# Patient Record
Sex: Female | Born: 1990 | Race: Black or African American | Hispanic: No | Marital: Single | Smoking: Current some day smoker
Health system: Southern US, Community
[De-identification: ages and names within clinical notes are randomized; demographics above are authoritative.]

## PROBLEM LIST (undated history)

## (undated) ENCOUNTER — Emergency Department (HOSPITAL_COMMUNITY): Admission: EM | Payer: Self-pay | Source: Home / Self Care

## (undated) DIAGNOSIS — R51 Headache: Secondary | ICD-10-CM

## (undated) DIAGNOSIS — R519 Headache, unspecified: Secondary | ICD-10-CM

## (undated) DIAGNOSIS — I10 Essential (primary) hypertension: Secondary | ICD-10-CM

## (undated) DIAGNOSIS — G8929 Other chronic pain: Secondary | ICD-10-CM

## (undated) HISTORY — DX: Essential (primary) hypertension: I10

## (undated) HISTORY — PX: EYE SURGERY: SHX253

---
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
## 2009-06-08 ENCOUNTER — Emergency Department (HOSPITAL_COMMUNITY): Admission: EM | Admit: 2009-06-08 | Discharge: 2009-06-08 | Payer: Self-pay | Admitting: Emergency Medicine

## 2009-12-16 ENCOUNTER — Emergency Department (HOSPITAL_COMMUNITY): Admission: EM | Admit: 2009-12-16 | Discharge: 2009-12-16 | Payer: Self-pay | Admitting: Emergency Medicine

## 2010-01-30 ENCOUNTER — Emergency Department (HOSPITAL_COMMUNITY): Admission: EM | Admit: 2010-01-30 | Discharge: 2010-01-30 | Payer: Self-pay | Admitting: Emergency Medicine

## 2010-04-27 ENCOUNTER — Emergency Department (HOSPITAL_COMMUNITY): Admission: EM | Admit: 2010-04-27 | Discharge: 2010-04-27 | Payer: Self-pay | Admitting: Emergency Medicine

## 2010-11-30 ENCOUNTER — Emergency Department (HOSPITAL_COMMUNITY)
Admission: EM | Admit: 2010-11-30 | Discharge: 2010-11-30 | Payer: Self-pay | Source: Home / Self Care | Admitting: Emergency Medicine

## 2011-01-22 ENCOUNTER — Emergency Department (HOSPITAL_COMMUNITY)
Admission: EM | Admit: 2011-01-22 | Discharge: 2011-01-22 | Disposition: A | Discharge status: Home / Self Care | Payer: Self-pay | Attending: Diagnostic Radiology | Admitting: Diagnostic Radiology

## 2011-01-22 DIAGNOSIS — R300 Dysuria: Secondary | ICD-10-CM | POA: Insufficient documentation

## 2011-01-22 DIAGNOSIS — N72 Inflammatory disease of cervix uteri: Secondary | ICD-10-CM | POA: Insufficient documentation

## 2011-01-22 LAB — URINALYSIS, ROUTINE W REFLEX MICROSCOPIC
Bilirubin Urine: NEGATIVE
Leukocytes, UA: NEGATIVE
Nitrite: NEGATIVE
Protein, ur: NEGATIVE mg/dL
Specific Gravity, Urine: 1.025 (ref 1.005–1.030)
Urine Glucose, Fasting: NEGATIVE mg/dL
Urobilinogen, UA: 1 mg/dL (ref 0.0–1.0)
pH: 6.5 (ref 5.0–8.0)

## 2011-01-22 LAB — WET PREP, GENITAL
Trich, Wet Prep: NONE SEEN
Yeast Wet Prep HPF POC: NONE SEEN

## 2011-01-22 LAB — URINE MICROSCOPIC-ADD ON

## 2011-01-24 LAB — URINE CULTURE: Colony Count: 80000

## 2011-01-24 LAB — GC/CHLAMYDIA PROBE AMP, URINE: GC Probe Amp, Urine: NEGATIVE

## 2011-02-26 LAB — POCT PREGNANCY, URINE: Preg Test, Ur: NEGATIVE

## 2011-03-06 LAB — URINALYSIS, ROUTINE W REFLEX MICROSCOPIC
Bilirubin Urine: NEGATIVE
Hgb urine dipstick: NEGATIVE
Nitrite: NEGATIVE
Protein, ur: NEGATIVE mg/dL
Specific Gravity, Urine: 1.025 (ref 1.005–1.030)
Urobilinogen, UA: 0.2 mg/dL (ref 0.0–1.0)

## 2011-03-06 LAB — WET PREP, GENITAL: Trich, Wet Prep: NONE SEEN

## 2011-03-06 LAB — GC/CHLAMYDIA PROBE AMP, GENITAL
Chlamydia, DNA Probe: NEGATIVE
GC Probe Amp, Genital: NEGATIVE

## 2011-03-26 LAB — URINALYSIS, ROUTINE W REFLEX MICROSCOPIC
Ketones, ur: NEGATIVE mg/dL
Nitrite: NEGATIVE
Protein, ur: NEGATIVE mg/dL
Urobilinogen, UA: 0.2 mg/dL (ref 0.0–1.0)

## 2011-03-26 LAB — URINE MICROSCOPIC-ADD ON

## 2011-03-26 LAB — PREGNANCY, URINE: Preg Test, Ur: NEGATIVE

## 2011-04-16 ENCOUNTER — Emergency Department (HOSPITAL_COMMUNITY)
Admission: EM | Admit: 2011-04-16 | Discharge: 2011-04-16 | Disposition: A | Discharge status: Home / Self Care | Payer: Self-pay | Attending: Emergency Medicine | Admitting: Emergency Medicine

## 2011-04-16 DIAGNOSIS — R112 Nausea with vomiting, unspecified: Secondary | ICD-10-CM | POA: Insufficient documentation

## 2011-04-16 DIAGNOSIS — R197 Diarrhea, unspecified: Secondary | ICD-10-CM | POA: Insufficient documentation

## 2011-04-16 DIAGNOSIS — J45909 Unspecified asthma, uncomplicated: Secondary | ICD-10-CM | POA: Insufficient documentation

## 2011-04-16 LAB — URINALYSIS, ROUTINE W REFLEX MICROSCOPIC
Ketones, ur: NEGATIVE mg/dL
Nitrite: NEGATIVE
Protein, ur: NEGATIVE mg/dL

## 2011-05-10 ENCOUNTER — Emergency Department (HOSPITAL_COMMUNITY)
Admission: EM | Admit: 2011-05-10 | Discharge: 2011-05-10 | Disposition: A | Discharge status: Home / Self Care | Payer: Self-pay | Attending: Emergency Medicine | Admitting: Emergency Medicine

## 2011-05-10 DIAGNOSIS — J069 Acute upper respiratory infection, unspecified: Secondary | ICD-10-CM | POA: Insufficient documentation

## 2011-05-10 DIAGNOSIS — J029 Acute pharyngitis, unspecified: Secondary | ICD-10-CM | POA: Insufficient documentation

## 2011-05-10 DIAGNOSIS — J329 Chronic sinusitis, unspecified: Secondary | ICD-10-CM | POA: Insufficient documentation

## 2011-05-10 DIAGNOSIS — R05 Cough: Secondary | ICD-10-CM | POA: Insufficient documentation

## 2011-05-10 DIAGNOSIS — R059 Cough, unspecified: Secondary | ICD-10-CM | POA: Insufficient documentation

## 2011-09-13 LAB — URINE MICROSCOPIC-ADD ON

## 2011-09-13 LAB — DIFFERENTIAL
Eosinophils Absolute: 0.1
Lymphocytes Relative: 47
Lymphs Abs: 1.9
Monocytes Relative: 10
Neutro Abs: 1.6 — ABNORMAL LOW
Neutrophils Relative %: 40 — ABNORMAL LOW

## 2011-09-13 LAB — URINE CULTURE: Colony Count: 8000

## 2011-09-13 LAB — CBC
MCV: 90.9
RBC: 3.81
WBC: 3.9 — ABNORMAL LOW

## 2011-09-13 LAB — BASIC METABOLIC PANEL
Chloride: 105
Creatinine, Ser: 0.91

## 2011-09-13 LAB — URINALYSIS, ROUTINE W REFLEX MICROSCOPIC
Glucose, UA: NEGATIVE
Hgb urine dipstick: NEGATIVE
Ketones, ur: NEGATIVE
Protein, ur: NEGATIVE

## 2011-09-21 LAB — URINALYSIS, ROUTINE W REFLEX MICROSCOPIC
Glucose, UA: NEGATIVE
Leukocytes, UA: NEGATIVE
Specific Gravity, Urine: 1.025
pH: 6

## 2011-09-21 LAB — PREGNANCY, URINE: Preg Test, Ur: NEGATIVE

## 2011-09-21 LAB — URINE MICROSCOPIC-ADD ON

## 2011-11-18 ENCOUNTER — Encounter: Payer: Self-pay | Admitting: *Deleted

## 2011-11-18 ENCOUNTER — Emergency Department (HOSPITAL_COMMUNITY): Payer: No Typology Code available for payment source

## 2011-11-18 ENCOUNTER — Emergency Department (HOSPITAL_COMMUNITY)
Admission: EM | Admit: 2011-11-18 | Discharge: 2011-11-18 | Disposition: A | Discharge status: Home / Self Care | Payer: No Typology Code available for payment source | Attending: Emergency Medicine | Admitting: Emergency Medicine

## 2011-11-18 DIAGNOSIS — M542 Cervicalgia: Secondary | ICD-10-CM | POA: Insufficient documentation

## 2011-11-18 DIAGNOSIS — T148XXA Other injury of unspecified body region, initial encounter: Secondary | ICD-10-CM | POA: Insufficient documentation

## 2011-11-18 DIAGNOSIS — M549 Dorsalgia, unspecified: Secondary | ICD-10-CM | POA: Insufficient documentation

## 2011-11-18 DIAGNOSIS — Y92410 Unspecified street and highway as the place of occurrence of the external cause: Secondary | ICD-10-CM | POA: Insufficient documentation

## 2011-11-18 MED ORDER — OXYCODONE-ACETAMINOPHEN 5-325 MG PO TABS: 1.0000 | ORAL_TABLET | Freq: Four times a day (QID) | ORAL | Status: AC | PRN

## 2011-11-18 MED ORDER — IBUPROFEN 600 MG PO TABS: 600.0000 mg | ORAL_TABLET | Freq: Four times a day (QID) | ORAL | Status: AC | PRN

## 2011-11-18 MED ORDER — OXYCODONE-ACETAMINOPHEN 5-325 MG PO TABS
2.0000 | ORAL_TABLET | Freq: Once | ORAL | Status: AC
  Administered 2011-11-18: 2 via ORAL
  Filled 2011-11-18 (×2): qty 1

## 2011-11-18 MED ORDER — CYCLOBENZAPRINE HCL 10 MG PO TABS: 10.0000 mg | ORAL_TABLET | Freq: Two times a day (BID) | ORAL | Status: AC | PRN

## 2011-11-18 NOTE — ED Provider Notes (Signed)
Medical screening examination/treatment/procedure(s) were performed by non-physician practitioner and as supervising physician I was immediately available for consultation/collaboration.  Celene Kras, MD 11/18/11 [phone]

## 2011-11-18 NOTE — ED Notes (Signed)
Family at bedside.pt arrived in c-collar and back board
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
## 2011-11-20 ENCOUNTER — Emergency Department (HOSPITAL_COMMUNITY): Payer: No Typology Code available for payment source

## 2011-11-20 ENCOUNTER — Encounter (HOSPITAL_COMMUNITY): Payer: Self-pay | Admitting: *Deleted

## 2011-11-20 ENCOUNTER — Emergency Department (HOSPITAL_COMMUNITY)
Admission: EM | Admit: 2011-11-20 | Discharge: 2011-11-21 | Disposition: A | Discharge status: Home / Self Care | Payer: No Typology Code available for payment source | Attending: Emergency Medicine | Admitting: Emergency Medicine

## 2011-11-20 DIAGNOSIS — T07XXXA Unspecified multiple injuries, initial encounter: Secondary | ICD-10-CM | POA: Insufficient documentation

## 2011-11-20 DIAGNOSIS — R079 Chest pain, unspecified: Secondary | ICD-10-CM | POA: Insufficient documentation

## 2011-11-20 DIAGNOSIS — M25569 Pain in unspecified knee: Secondary | ICD-10-CM | POA: Insufficient documentation

## 2011-11-20 DIAGNOSIS — J45909 Unspecified asthma, uncomplicated: Secondary | ICD-10-CM | POA: Insufficient documentation

## 2011-11-20 DIAGNOSIS — M79609 Pain in unspecified limb: Secondary | ICD-10-CM | POA: Insufficient documentation

## 2011-11-20 DIAGNOSIS — M25559 Pain in unspecified hip: Secondary | ICD-10-CM | POA: Insufficient documentation
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
## 2011-11-21 NOTE — ED Provider Notes (Signed)
Medical screening examination/treatment/procedure(s) were performed by non-physician practitioner and as supervising physician I was immediately available for consultation/collaboration.   Benny Lennert, MD 11/21/11 1452
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
## 2012-01-22 NOTE — ED Provider Notes (Signed)
Medical screening examination/treatment/procedure(s) were performed by non-physician practitioner and as supervising physician I was immediately available for consultation/collaboration. \  Rigoberto Repass L Dariela Stoker, MD 01/22/12 1619 

## 2012-02-18 ENCOUNTER — Encounter (HOSPITAL_COMMUNITY): Payer: Self-pay | Admitting: Emergency Medicine

## 2012-02-18 ENCOUNTER — Emergency Department (HOSPITAL_COMMUNITY)
Admission: EM | Admit: 2012-02-18 | Discharge: 2012-02-18 | Disposition: A | Discharge status: Home / Self Care | Payer: Self-pay | Attending: Emergency Medicine | Admitting: Emergency Medicine

## 2012-02-18 DIAGNOSIS — J45909 Unspecified asthma, uncomplicated: Secondary | ICD-10-CM | POA: Insufficient documentation

## 2012-02-18 DIAGNOSIS — N390 Urinary tract infection, site not specified: Secondary | ICD-10-CM | POA: Insufficient documentation

## 2012-02-18 DIAGNOSIS — R350 Frequency of micturition: Secondary | ICD-10-CM | POA: Insufficient documentation

## 2012-02-18 LAB — POCT PREGNANCY, URINE: Preg Test, Ur: NEGATIVE

## 2012-02-18 LAB — URINALYSIS, ROUTINE W REFLEX MICROSCOPIC
Bilirubin Urine: NEGATIVE
Ketones, ur: NEGATIVE mg/dL
Protein, ur: NEGATIVE mg/dL
Specific Gravity, Urine: >1.03 — ABNORMAL HIGH (ref 1.005–1.030)
Urobilinogen, UA: 0.2 mg/dL (ref 0.0–1.0)

## 2012-02-18 LAB — URINE MICROSCOPIC-ADD ON

## 2012-02-18 MED ORDER — NITROFURANTOIN MONOHYD MACRO 100 MG PO CAPS: 100.0000 mg | ORAL_CAPSULE | Freq: Two times a day (BID) | ORAL | Status: AC

## 2012-02-18 MED ORDER — IBUPROFEN 800 MG PO TABS
800.0000 mg | ORAL_TABLET | Freq: Once | ORAL | Status: AC
  Administered 2012-02-18: 800 mg via ORAL
  Filled 2012-02-18: qty 1

## 2012-02-18 MED ORDER — NITROFURANTOIN MACROCRYSTAL 100 MG PO CAPS
100.0000 mg | ORAL_CAPSULE | Freq: Once | ORAL | Status: AC
  Administered 2012-02-18: 100 mg via ORAL
  Filled 2012-02-18: qty 1

## 2012-02-18 MED ORDER — PHENAZOPYRIDINE HCL 200 MG PO TABS: 200.0000 mg | ORAL_TABLET | Freq: Three times a day (TID) | ORAL | Status: AC

## 2012-02-18 NOTE — ED Notes (Signed)
Voiding frequently, denies pain. Alert , NAD

## 2012-02-18 NOTE — ED Notes (Signed)
Pt c/o urinary frequency, but denies any pain.

## 2012-02-18 NOTE — ED Provider Notes (Signed)
History     CSN: [id number]  Arrival date & time 02/18/12  1352   First MD Initiated Contact with Patient 02/18/12 1440      Chief Complaint  Patient presents with  . Urinary Frequency    (Consider location/radiation/quality/duration/timing/severity/associated sxs/prior treatment) Patient is a 21 y.o. female presenting with dysuria. The history is provided by the patient. No language interpreter was used.  Dysuria  This is a new problem. The current episode started 2 days ago. The problem occurs every urination. The problem has not changed since onset.The quality of the pain is described as burning. The pain is at a severity of 6/10. The pain is moderate. There has been no fever. She is sexually active. There is no history of pyelonephritis. Associated symptoms include frequency. Pertinent negatives include no chills, no vomiting, no discharge, no hematuria, no hesitancy, no possible pregnancy, no urgency and no flank pain. She has tried nothing for the symptoms. Her past medical history is significant for recurrent UTIs. Her past medical history does not include kidney stones, single kidney, urological procedure, urinary stasis or catheterization.    Past Medical History  Diagnosis Date  . Asthma     History reviewed. No pertinent past surgical history.  History reviewed. No pertinent family history.  History  Substance Use Topics  . Smoking status: Never Smoker   . Smokeless tobacco: Not on file  . Alcohol Use: No    OB History    Grav Para Term Preterm Abortions TAB SAB Ect Mult Living                  Review of Systems  Constitutional: Negative for fever and chills.  Gastrointestinal: Negative for vomiting, abdominal pain and diarrhea.  Genitourinary: Positive for dysuria and frequency. Negative for hesitancy, urgency, hematuria, flank pain, vaginal bleeding, vaginal discharge and vaginal pain.  Musculoskeletal: Negative for back pain.  Neurological: Negative for  dizziness, weakness and numbness.  All other systems reviewed and are negative.    Allergies  Tomato  Home Medications   Current Outpatient Rx  Name Route Sig Dispense Refill  . ALBUTEROL SULFATE HFA 108 (90 BASE) MCG/ACT IN AERS Inhalation Inhale 2 puffs into the lungs every 6 (six) hours as needed. For shortness of breath       BP 120/65  Pulse 112  Temp(Src) 98 F (36.7 C) (Oral)  Resp 18  Ht 5' (1.524 m)  Wt 160 lb (72.576 kg)  BMI 31.25 kg/m2  SpO2 100%  LMP 02/16/2012  Physical Exam  Nursing note and vitals reviewed. Constitutional: She is oriented to person, place, and time. She appears well-developed and well-nourished. No distress.  HENT:  Head: Normocephalic and atraumatic.  Mouth/Throat: Oropharynx is clear and moist.  Neck: Normal range of motion. Neck supple.  Cardiovascular: Normal rate, regular rhythm and normal heart sounds.   Pulmonary/Chest: Effort normal and breath sounds normal. No respiratory distress.  Abdominal: Soft. She exhibits no distension. There is no tenderness. There is no rebound, no guarding and no CVA tenderness.  Musculoskeletal: Normal range of motion. She exhibits no tenderness.  Lymphadenopathy:    She has no cervical adenopathy.  Neurological: She is alert and oriented to person, place, and time. No cranial nerve deficit. She exhibits normal muscle tone. Coordination normal.  Skin: Skin is warm and dry.    ED Course  Procedures (including critical care time)  Labs Reviewed  URINALYSIS, ROUTINE W REFLEX MICROSCOPIC - Abnormal; Notable for the following:  Specific Gravity, Urine >1.030 (*)    Hgb urine dipstick SMALL (*)    Leukocytes, UA SMALL (*)    All other components within normal limits  URINE MICROSCOPIC-ADD ON - Abnormal; Notable for the following:    Squamous Epithelial / LPF MANY (*)    Bacteria, UA MANY (*)    All other components within normal limits  POCT PREGNANCY, URINE      Urine culture is  pending  MDM     Patient has dysuria type symptoms for several days. she denies any pain at present. She is nontoxic appearing. Abdomen is soft and nontender no CVA tenderness on exam, vomiting or fever to suggest pyelonephritis. I will start her on Macrobid she agrees to close followup with her physician or to return here if symptoms worsen   Patient / Family / Caregiver understand and agree with initial ED impression and plan with expectations set for ED visit. Pt stable in ED with no significant deterioration in condition.     Eual Lindstrom L. Clyde, [place] 02/18/12 1947

## 2012-02-19 LAB — URINE CULTURE: Culture  Setup Time: 201303050133

## 2012-02-19 NOTE — ED Provider Notes (Signed)
Medical screening examination/treatment/procedure(s) were performed by non-physician practitioner and as supervising physician I was immediately available for consultation/collaboration.  Nicoletta Dress. Colon Branch, MD 02/19/12 1914

## 2012-07-17 ENCOUNTER — Encounter (HOSPITAL_COMMUNITY): Payer: Self-pay | Admitting: *Deleted

## 2012-07-17 ENCOUNTER — Emergency Department (HOSPITAL_COMMUNITY)
Admission: EM | Admit: 2012-07-17 | Discharge: 2012-07-17 | Disposition: A | Discharge status: Home / Self Care | Payer: Self-pay | Attending: Emergency Medicine | Admitting: Emergency Medicine

## 2012-07-17 DIAGNOSIS — M545 Low back pain, unspecified: Secondary | ICD-10-CM | POA: Insufficient documentation

## 2012-07-17 DIAGNOSIS — R350 Frequency of micturition: Secondary | ICD-10-CM

## 2012-07-17 DIAGNOSIS — J45909 Unspecified asthma, uncomplicated: Secondary | ICD-10-CM | POA: Insufficient documentation

## 2012-07-17 LAB — URINE MICROSCOPIC-ADD ON

## 2012-07-17 LAB — URINALYSIS, ROUTINE W REFLEX MICROSCOPIC
Glucose, UA: 250 mg/dL — AB
pH: 7 (ref 5.0–8.0)

## 2012-07-17 LAB — GLUCOSE, CAPILLARY: Glucose-Capillary: 79 mg/dL (ref 70–99)

## 2012-07-17 NOTE — ED Notes (Signed)
Low back pain, onset today

## 2012-07-17 NOTE — ED Provider Notes (Signed)
History     CSN: [id number]  Arrival date & time 07/17/12  2105   First MD Initiated Contact with Patient 07/17/12 2302      Chief Complaint  Patient presents with  . Back Pain    (Consider location/radiation/quality/duration/timing/severity/associated sxs/prior treatment) HPI Comments: Low back and suprapubic pain.  + frequency o/w no UTI sxs.  Pt states it feels similar to previous UTI sxs though.  Patient is a 21 y.o. female presenting with back pain. The history is provided by the patient. No language interpreter was used.  Back Pain  This is a new problem. Episode onset: several days ago. The problem occurs constantly. The problem has not changed since onset.The pain is associated with no known injury. The pain is present in the lumbar spine. The quality of the pain is described as aching. The pain does not radiate. The pain is moderate. The symptoms are aggravated by bending. The pain is the same all the time. Associated symptoms include abdominal pain. Pertinent negatives include no fever, no bowel incontinence, no perianal numbness, no bladder incontinence, no dysuria, no pelvic pain, no leg pain, no paresthesias, no paresis, no tingling and no weakness. Treatments tried: AZO. The treatment provided moderate relief.    Past Medical History  Diagnosis Date  . Asthma     Past Surgical History  Procedure Date  . Eye surgery     History reviewed. No pertinent family history.  History  Substance Use Topics  . Smoking status: Never Smoker   . Smokeless tobacco: Not on file  . Alcohol Use: No    OB History    Grav Para Term Preterm Abortions TAB SAB Ect Mult Living                  Review of Systems  Constitutional: Negative for fever.  Gastrointestinal: Positive for abdominal pain. Negative for nausea, vomiting and bowel incontinence.  Genitourinary: Positive for frequency. Negative for bladder incontinence, dysuria, urgency, hematuria and pelvic pain.    Musculoskeletal: Positive for back pain.  Neurological: Negative for tingling, weakness and paresthesias.  All other systems reviewed and are negative.    Allergies  Tomato  Home Medications   Current Outpatient Rx  Name Route Sig Dispense Refill  . ALBUTEROL SULFATE HFA 108 (90 BASE) MCG/ACT IN AERS Inhalation Inhale 2 puffs into the lungs every 6 (six) hours as needed. For shortness of breath       BP 122/72  Pulse 101  Temp 98.5 F (36.9 C) (Oral)  Resp 20  Ht 5\' 1"  (1.549 m)  Wt 167 lb (75.751 kg)  BMI 31.55 kg/m2  LMP 07/09/2012  Physical Exam  Nursing note and vitals reviewed. Constitutional: She is oriented to person, place, and time. She appears well-developed and well-nourished. No distress.  HENT:  Head: Normocephalic and atraumatic.  Eyes: EOM are normal.  Neck: Normal range of motion.  Cardiovascular: Normal rate, regular rhythm and normal heart sounds.   Pulmonary/Chest: Effort normal and breath sounds normal.  Abdominal: Soft. She exhibits no distension and no mass. There is no hepatosplenomegaly. There is tenderness in the suprapubic area. There is no rigidity, no rebound, no guarding, no CVA tenderness, no tenderness at McBurney's point and negative Murphy's sign.    Musculoskeletal: She exhibits tenderness.       Lumbar back: She exhibits tenderness and pain. She exhibits normal range of motion, no bony tenderness, no swelling, no edema, no deformity, no laceration, no spasm and normal pulse.  Back:  Neurological: She is alert and oriented to person, place, and time.  Skin: Skin is warm and dry.  Psychiatric: She has a normal mood and affect. Judgment normal.    ED Course  Procedures (including critical care time)  Labs Reviewed  URINALYSIS, ROUTINE W REFLEX MICROSCOPIC - Abnormal; Notable for the following:    Color, Urine ORANGE (*)  BIOCHEMICALS MAY BE AFFECTED BY COLOR   Glucose, UA 250 (*)     Ketones, ur TRACE (*)     Protein, ur  100 (*)     Urobilinogen, UA >8.0 (*)     Nitrite POSITIVE (*)     Leukocytes, UA TRACE (*)     All other components within normal limits  URINE MICROSCOPIC-ADD ON - Abnormal; Notable for the following:    Squamous Epithelial / LPF FEW (*)     Bacteria, UA FEW (*)     All other components within normal limits   No results found.   No diagnosis found.    MDM  U/a is normal.  cbg is normal.  F/u with PCP urine cx is pending.        Evalina Field, [place] 07/17/12 [phone]

## 2012-07-19 NOTE — ED Provider Notes (Signed)
Medical screening examination/treatment/procedure(s) were performed by non-physician practitioner and as supervising physician I was immediately available for consultation/collaboration.  Toy Baker, MD 07/19/12 [phone]

## 2012-10-04 ENCOUNTER — Encounter (HOSPITAL_COMMUNITY): Payer: Self-pay | Admitting: Emergency Medicine

## 2012-10-04 DIAGNOSIS — J45909 Unspecified asthma, uncomplicated: Secondary | ICD-10-CM | POA: Insufficient documentation

## 2012-10-04 DIAGNOSIS — R070 Pain in throat: Secondary | ICD-10-CM | POA: Insufficient documentation

## 2012-10-04 DIAGNOSIS — R1110 Vomiting, unspecified: Secondary | ICD-10-CM | POA: Insufficient documentation

## 2012-10-04 DIAGNOSIS — Z79899 Other long term (current) drug therapy: Secondary | ICD-10-CM | POA: Insufficient documentation

## 2012-10-04 NOTE — ED Notes (Signed)
Sore throat and emesis x 1 week.

## 2012-10-05 ENCOUNTER — Emergency Department (HOSPITAL_COMMUNITY)
Admission: EM | Admit: 2012-10-05 | Discharge: 2012-10-05 | Disposition: A | Discharge status: Home / Self Care | Payer: Self-pay | Attending: Emergency Medicine | Admitting: Emergency Medicine

## 2012-10-05 DIAGNOSIS — J029 Acute pharyngitis, unspecified: Secondary | ICD-10-CM

## 2012-10-05 DIAGNOSIS — B349 Viral infection, unspecified: Secondary | ICD-10-CM

## 2012-10-05 LAB — RAPID STREP SCREEN (MED CTR MEBANE ONLY): Streptococcus, Group A Screen (Direct): NEGATIVE

## 2012-10-05 NOTE — ED Notes (Signed)
Pt notes cough, sore throat, and emesis for several days, denies emesis today but also notes she has not eaten all day. No other complaints, pt AAx4 at this time,

## 2012-10-05 NOTE — ED Provider Notes (Signed)
History     CSN: [id number]  Arrival date & time 10/04/12  2226   First MD Initiated Contact with Patient 10/05/12 0123      Chief Complaint  Patient presents with  . Sore Throat  . Emesis    (Consider location/radiation/quality/duration/timing/severity/associated sxs/prior treatment) HPI  Barbara Lam is a 21 y.o. female who presents to the Emergency Department complaining of sore throat which has resulted in vomiting intermittently x 1 week. Denies fever, chills, cough, shortness of breath. She has taken OTC medicines with little relief.  PCP Dr. Milford Cage  Past Medical History  Diagnosis Date  . Asthma     Past Surgical History  Procedure Date  . Eye surgery     History reviewed. No pertinent family history.  History  Substance Use Topics  . Smoking status: Never Smoker   . Smokeless tobacco: Not on file  . Alcohol Use: No    OB History    Grav Para Term Preterm Abortions TAB SAB Ect Mult Living                  Review of Systems  Constitutional: Negative for fever.       10 Systems reviewed and are negative for acute change except as noted in the HPI.  HENT: Positive for sore throat. Negative for congestion.   Eyes: Negative for discharge and redness.  Respiratory: Negative for cough and shortness of breath.   Cardiovascular: Negative for chest pain.  Gastrointestinal: Negative for vomiting and abdominal pain.  Musculoskeletal: Negative for back pain.  Skin: Negative for rash.  Neurological: Negative for syncope, numbness and headaches.  Psychiatric/Behavioral:       No behavior change.    Allergies  Tomato  Home Medications   Current Outpatient Rx  Name Route Sig Dispense Refill  . ALBUTEROL SULFATE HFA 108 (90 BASE) MCG/ACT IN AERS Inhalation Inhale 2 puffs into the lungs every 6 (six) hours as needed. For shortness of breath       BP 113/77  Pulse 102  Temp 98.5 F (36.9 C) (Oral)  Ht 5' (1.524 m)  Wt 130 lb (58.968 kg)  BMI 25.39  kg/m2  SpO2 100%  LMP 09/24/2012  Physical Exam  Nursing note and vitals reviewed. Constitutional: She appears well-developed and well-nourished.       Awake, alert, nontoxic appearance.  HENT:  Head: Normocephalic and atraumatic.  Right Ear: External ear normal.  Left Ear: External ear normal.  Mouth/Throat: Oropharynx is clear and moist.  Eyes: Right eye exhibits no discharge. Left eye exhibits no discharge.  Neck: Neck supple.  Cardiovascular: Normal heart sounds.   Pulmonary/Chest: Effort normal and breath sounds normal. She exhibits no tenderness.  Abdominal: Soft. There is no tenderness. There is no rebound.  Musculoskeletal: She exhibits no tenderness.       Baseline ROM, no obvious new focal weakness.  Neurological:       Mental status and motor strength appears baseline for patient and situation.  Skin: No rash noted.  Psychiatric: She has a normal mood and affect.    ED Course  Procedures (including critical care time) Results for orders placed during the hospital encounter of 10/05/12  RAPID STREP SCREEN      Component Value Range   Streptococcus, Group A Screen (Direct) NEGATIVE  NEGATIVE       MDM  Patient with sore throat x 1 weeks associated with intermittent vomiting. Strep test is negative. PE is unremarkable. Suspect this is  a viral pharyngitis. Dx testing d/w pt.  Questions answered.  Verb understanding, agreeable to d/c home with outpt f/u.Pt stable in ED with no significant deterioration in condition.The patient appears reasonably screened and/or stabilized for discharge and I doubt any other medical condition or other [facility] requiring further screening, evaluation, or treatment in the ED at this time prior to discharge.  MDM Reviewed: nursing note and vitals Interpretation: labs            Nicoletta Dress. Colon Branch, MD 10/05/12 4540

## 2012-11-22 ENCOUNTER — Emergency Department (HOSPITAL_COMMUNITY)
Admission: EM | Admit: 2012-11-22 | Discharge: 2012-11-22 | Disposition: A | Discharge status: Home / Self Care | Payer: Self-pay | Attending: Emergency Medicine | Admitting: Emergency Medicine

## 2012-11-22 ENCOUNTER — Encounter (HOSPITAL_COMMUNITY): Payer: Self-pay | Admitting: Emergency Medicine

## 2012-11-22 DIAGNOSIS — R51 Headache: Secondary | ICD-10-CM | POA: Insufficient documentation

## 2012-11-22 DIAGNOSIS — R112 Nausea with vomiting, unspecified: Secondary | ICD-10-CM | POA: Insufficient documentation

## 2012-11-22 DIAGNOSIS — Z79899 Other long term (current) drug therapy: Secondary | ICD-10-CM | POA: Insufficient documentation

## 2012-11-22 DIAGNOSIS — H53149 Visual discomfort, unspecified: Secondary | ICD-10-CM | POA: Insufficient documentation

## 2012-11-22 DIAGNOSIS — R42 Dizziness and giddiness: Secondary | ICD-10-CM | POA: Insufficient documentation

## 2012-11-22 DIAGNOSIS — J45909 Unspecified asthma, uncomplicated: Secondary | ICD-10-CM | POA: Insufficient documentation

## 2012-11-22 DIAGNOSIS — R197 Diarrhea, unspecified: Secondary | ICD-10-CM | POA: Insufficient documentation

## 2012-11-22 MED ORDER — PROMETHAZINE HCL 25 MG RE SUPP: 25.0000 mg | Freq: Four times a day (QID) | RECTAL | Status: DC | PRN

## 2012-11-22 MED ORDER — SODIUM CHLORIDE 0.9 % IV SOLN
1000.0000 mL | Freq: Once | INTRAVENOUS | Status: AC
  Administered 2012-11-22: 1000 mL via INTRAVENOUS

## 2012-11-22 MED ORDER — SODIUM CHLORIDE 0.9 % IV SOLN: 1000.0000 mL | INTRAVENOUS | Status: DC

## 2012-11-22 MED ORDER — METOCLOPRAMIDE HCL 5 MG/ML IJ SOLN
10.0000 mg | Freq: Once | INTRAMUSCULAR | Status: AC
  Administered 2012-11-22: 10 mg via INTRAVENOUS
  Filled 2012-11-22: qty 2

## 2012-11-22 MED ORDER — DIPHENHYDRAMINE HCL 50 MG/ML IJ SOLN
25.0000 mg | Freq: Once | INTRAMUSCULAR | Status: AC
  Administered 2012-11-22: 25 mg via INTRAVENOUS
  Filled 2012-11-22: qty 1

## 2012-11-22 MED ORDER — PROMETHAZINE HCL 25 MG PO TABS: 25.0000 mg | ORAL_TABLET | Freq: Four times a day (QID) | ORAL | Status: DC | PRN

## 2012-11-22 NOTE — ED Notes (Signed)
Nurse in to change IV site. Original IV taken out pt refused to have another started. Stated she was going home.
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
## 2012-11-22 NOTE — ED Notes (Signed)
Patient with c/o headache and vomiting since Thursday. No relief with OTC meds.

## 2012-11-22 NOTE — ED Notes (Signed)
Pt states IV hurts and wants it removed. Attempted to look for a different site and patient declined. States she is going to go home

## 2012-11-22 NOTE — ED Notes (Signed)
Complain of burning at IV site. No redness or swelling noted.

## 2013-06-20 ENCOUNTER — Encounter (HOSPITAL_COMMUNITY): Payer: Self-pay | Admitting: *Deleted

## 2013-06-20 ENCOUNTER — Emergency Department (HOSPITAL_COMMUNITY)
Admission: EM | Admit: 2013-06-20 | Discharge: 2013-06-20 | Disposition: A | Discharge status: Home / Self Care | Payer: Self-pay | Attending: Emergency Medicine | Admitting: Emergency Medicine

## 2013-06-20 DIAGNOSIS — J45909 Unspecified asthma, uncomplicated: Secondary | ICD-10-CM | POA: Insufficient documentation

## 2013-06-20 DIAGNOSIS — Y9389 Activity, other specified: Secondary | ICD-10-CM | POA: Insufficient documentation

## 2013-06-20 DIAGNOSIS — W57XXXA Bitten or stung by nonvenomous insect and other nonvenomous arthropods, initial encounter: Secondary | ICD-10-CM | POA: Insufficient documentation

## 2013-06-20 DIAGNOSIS — Z79899 Other long term (current) drug therapy: Secondary | ICD-10-CM | POA: Insufficient documentation

## 2013-06-20 DIAGNOSIS — Y9289 Other specified places as the place of occurrence of the external cause: Secondary | ICD-10-CM | POA: Insufficient documentation

## 2013-06-20 DIAGNOSIS — S1096XA Insect bite of unspecified part of neck, initial encounter: Secondary | ICD-10-CM | POA: Insufficient documentation

## 2013-06-20 DIAGNOSIS — R21 Rash and other nonspecific skin eruption: Secondary | ICD-10-CM | POA: Insufficient documentation

## 2013-06-20 MED ORDER — DIPHENHYDRAMINE HCL 25 MG PO CAPS
25.0000 mg | ORAL_CAPSULE | Freq: Once | ORAL | Status: AC
  Administered 2013-06-20: 25 mg via ORAL

## 2013-06-20 MED ORDER — TRIAMCINOLONE ACETONIDE 0.1 % EX CREA: TOPICAL_CREAM | Freq: Two times a day (BID) | CUTANEOUS | Status: DC

## 2013-06-20 MED ORDER — DIPHENHYDRAMINE HCL 25 MG PO CAPS: 25.0000 mg | ORAL_CAPSULE | Freq: Four times a day (QID) | ORAL | Status: DC | PRN

## 2013-06-20 MED ORDER — DIPHENHYDRAMINE HCL 25 MG PO CAPS
ORAL_CAPSULE | ORAL | Status: AC
  Filled 2013-06-20: qty 1

## 2013-06-20 NOTE — ED Provider Notes (Signed)
History    CSN: [id number] Arrival date & time 06/20/13  1215  First MD Initiated Contact with Patient 06/20/13 1239     Chief Complaint  Patient presents with  . Insect Bite   (Consider location/radiation/quality/duration/timing/severity/associated sxs/prior Treatment) HPI Comments: Barbara Lam is a 22 y.o. Female who spent some time outdoor 2 evenings ago and was bit by an unknown insect several times under her chin and neck.  She has persistent itching and swelling at the bite sites which temporarily improves with cold compresses and benadryl.  She denies pain and has had no drainage from the sites.  She also denies fever, chills, cough, wheezing and shortness of breath.  The history is provided by the patient.   Past Medical History  Diagnosis Date  . Asthma    Past Surgical History  Procedure Laterality Date  . Eye surgery     No family history on file. History  Substance Use Topics  . Smoking status: Never Smoker   . Smokeless tobacco: Not on file  . Alcohol Use: No   OB History   Grav Para Term Preterm Abortions TAB SAB Ect Mult Living                 Review of Systems  Constitutional: Negative for fever and chills.  HENT: Negative for facial swelling.   Respiratory: Negative for shortness of breath and wheezing.   Skin: Positive for wound. Negative for color change.  Neurological: Negative for numbness.    Allergies  Tomato  Home Medications   Current Outpatient Rx  Name  Route  Sig  Dispense  Refill  . albuterol (VENTOLIN HFA) 108 (90 BASE) MCG/ACT inhaler   Inhalation   Inhale 2 puffs into the lungs every 6 (six) hours as needed. For shortness of breath          . diphenhydrAMINE (BENADRYL) 25 mg capsule   Oral   Take 1 capsule (25 mg total) by mouth every 6 (six) hours as needed for itching.   30 capsule   0   . triamcinolone cream (KENALOG) 0.1 %   Topical   Apply topically 2 (two) times daily.   15 g   0    BP 95/67  Pulse 78   Temp(Src) 98.2 F (36.8 C) (Oral)  Resp 19  Ht 5\' 4"  (1.626 m)  Wt 150 lb (68.04 kg)  BMI 25.73 kg/m2  SpO2 97%  LMP 06/09/2013 Physical Exam  Constitutional: She appears well-developed and well-nourished. No distress.  HENT:  Head: Normocephalic.  Mouth/Throat: Uvula is midline and oropharynx is clear and moist. No posterior oropharyngeal edema or posterior oropharyngeal erythema.  Neck: Neck supple.  Cardiovascular: Normal rate.   Pulmonary/Chest: Effort normal. She has no decreased breath sounds. She has no wheezes.  Musculoskeletal: Normal range of motion. She exhibits no edema.  Lymphadenopathy:    She has no cervical adenopathy.  Skin: Rash noted. Rash is nodular. No erythema.  Three distinct nodular, nontender raised lesions inferior chin and right lateral neck.  No erythema,  Increased warmth, no drainage or ttp.      ED Course  Procedures (including critical care time) Labs Reviewed - No data to display No results found. 1. Insect bite of neck with local reaction, initial encounter     MDM  Pt with exam c/w localized reaction to insect bites, possibly mosquitos.  No evidence of infected bites.  Pt encouraged to continue benadryl,  Added triamcinolone to apply sparingly  bid x 1 week only.  Recheck by pcp if not improved or if sx worsened in any way.  The patient appears reasonably screened and/or stabilized for discharge and I doubt any other medical condition or other [facility] requiring further screening, evaluation, or treatment in the ED at this time prior to discharge.   Burgess Amor, PA-C 06/20/13 2112

## 2013-06-20 NOTE — ED Notes (Signed)
Pt states "something got me" First noticed welt under chin 2 days ago.  NAD. Denies any other symptoms at this time besides irritation to area.

## 2013-06-21 NOTE — ED Provider Notes (Signed)
Medical screening examination/treatment/procedure(s) were performed by non-physician practitioner and as supervising physician I was immediately available for consultation/collaboration.   Davontay Watlington M Nicholos Aloisi, DO 06/21/13 0725 

## 2014-01-31 ENCOUNTER — Emergency Department (HOSPITAL_COMMUNITY)
Admission: EM | Admit: 2014-01-31 | Discharge: 2014-01-31 | Disposition: A | Discharge status: Home / Self Care | Payer: Self-pay | Attending: Emergency Medicine | Admitting: Emergency Medicine

## 2014-01-31 ENCOUNTER — Encounter (HOSPITAL_COMMUNITY): Payer: Self-pay | Admitting: Emergency Medicine

## 2014-01-31 DIAGNOSIS — J45909 Unspecified asthma, uncomplicated: Secondary | ICD-10-CM | POA: Insufficient documentation

## 2014-01-31 DIAGNOSIS — Z3202 Encounter for pregnancy test, result negative: Secondary | ICD-10-CM | POA: Insufficient documentation

## 2014-01-31 DIAGNOSIS — R112 Nausea with vomiting, unspecified: Secondary | ICD-10-CM | POA: Insufficient documentation

## 2014-01-31 DIAGNOSIS — R51 Headache: Secondary | ICD-10-CM | POA: Insufficient documentation

## 2014-01-31 HISTORY — DX: Other chronic pain: G89.29

## 2014-01-31 HISTORY — DX: Headache: R51

## 2014-01-31 HISTORY — DX: Headache, unspecified: R51.9

## 2014-01-31 LAB — CBC WITH DIFFERENTIAL/PLATELET
Basophils Absolute: 0 10*3/uL (ref 0.0–0.1)
Basophils Relative: 0 % (ref 0–1)
EOS PCT: 4 % (ref 0–5)
Eosinophils Absolute: 0.2 10*3/uL (ref 0.0–0.7)
HEMATOCRIT: 35.9 % — AB (ref 36.0–46.0)
Hemoglobin: 12.1 g/dL (ref 12.0–15.0)
LYMPHS ABS: 1.8 10*3/uL (ref 0.7–4.0)
LYMPHS PCT: 47 % — AB (ref 12–46)
MCH: 30.9 pg (ref 26.0–34.0)
MCHC: 33.7 g/dL (ref 30.0–36.0)
MCV: 91.8 fL (ref 78.0–100.0)
MONO ABS: 0.2 10*3/uL (ref 0.1–1.0)
Monocytes Relative: 6 % (ref 3–12)
Neutro Abs: 1.6 10*3/uL — ABNORMAL LOW (ref 1.7–7.7)
Neutrophils Relative %: 43 % (ref 43–77)
Platelets: 219 10*3/uL (ref 150–400)
RBC: 3.91 MIL/uL (ref 3.87–5.11)
RDW: 12.6 % (ref 11.5–15.5)
WBC: 3.8 10*3/uL — AB (ref 4.0–10.5)

## 2014-01-31 LAB — COMPREHENSIVE METABOLIC PANEL
ALT: 8 U/L (ref 0–35)
AST: 12 U/L (ref 0–37)
Albumin: 3.8 g/dL (ref 3.5–5.2)
Alkaline Phosphatase: 72 U/L (ref 39–117)
BUN: 10 mg/dL (ref 6–23)
CALCIUM: 9.3 mg/dL (ref 8.4–10.5)
CO2: 26 meq/L (ref 19–32)
CREATININE: 0.85 mg/dL (ref 0.50–1.10)
Chloride: 101 mEq/L (ref 96–112)
GLUCOSE: 79 mg/dL (ref 70–99)
Potassium: 4.2 mEq/L (ref 3.7–5.3)
Sodium: 137 mEq/L (ref 137–147)
Total Bilirubin: 0.4 mg/dL (ref 0.3–1.2)
Total Protein: 8.4 g/dL — ABNORMAL HIGH (ref 6.0–8.3)

## 2014-01-31 LAB — URINALYSIS, ROUTINE W REFLEX MICROSCOPIC
BILIRUBIN URINE: NEGATIVE
Glucose, UA: NEGATIVE mg/dL
Ketones, ur: NEGATIVE mg/dL
Leukocytes, UA: NEGATIVE
NITRITE: NEGATIVE
Protein, ur: NEGATIVE mg/dL
SPECIFIC GRAVITY, URINE: 1.025 (ref 1.005–1.030)
UROBILINOGEN UA: 0.2 mg/dL (ref 0.0–1.0)
pH: 6 (ref 5.0–8.0)

## 2014-01-31 LAB — LIPASE, BLOOD: LIPASE: 33 U/L (ref 11–59)

## 2014-01-31 LAB — URINE MICROSCOPIC-ADD ON

## 2014-01-31 LAB — POCT PREGNANCY, URINE: PREG TEST UR: NEGATIVE

## 2014-01-31 MED ORDER — SODIUM CHLORIDE 0.9 % IV BOLUS (SEPSIS): 1000.0000 mL | Freq: Once | INTRAVENOUS | Status: DC

## 2014-01-31 MED ORDER — ONDANSETRON 4 MG PO TBDP
4.0000 mg | ORAL_TABLET | Freq: Once | ORAL | Status: AC
  Administered 2014-01-31: 4 mg via ORAL
  Filled 2014-01-31: qty 1

## 2014-01-31 MED ORDER — ONDANSETRON 4 MG PO TBDP: 4.0000 mg | ORAL_TABLET | Freq: Once | ORAL | Status: DC

## 2014-01-31 MED ORDER — ONDANSETRON HCL 4 MG/2ML IJ SOLN
4.0000 mg | Freq: Once | INTRAMUSCULAR | Status: DC
  Filled 2014-01-31: qty 2

## 2014-01-31 NOTE — ED Notes (Signed)
Pt c/o n/v x 3 since ate out last night. Denies diarrhea. Headache. Mm wet. Nad.

## 2014-01-31 NOTE — ED Provider Notes (Signed)
CSN: [id number]     Arrival date & time 01/31/14  1501 History   First MD Initiated Contact with Patient 01/31/14 1511     Chief Complaint  Patient presents with  . Emesis     (Consider location/radiation/quality/duration/timing/severity/associated sxs/prior Treatment) HPI  Patient presents with concern of new nausea, vomiting.  She notes that since eating out at a restaurant approximately 18 hours ago she has had nausea, and significant nausea began she also experienced episodes of vomiting. No abdominal pain, no diarrhea, no fever, no chills. No other sick individuals. No chest pain, no dyspnea, no other notable changes from baseline. After initially complained of nausea, the patient requests MRI for evaluation of headache. She states that for the past 2+ years she has had headaches intermittently.  Headaches start gradually, are diffuse, sore, severe, improved with either OTC or narcotic medication. She has not seen a physician during that time She voices concern over the persistency of the headaches, though she denies any notable changes or any neurologic deficits during these episodes.  She currently does not have a headache.   Past Medical History  Diagnosis Date  . Asthma   . Chronic headaches    Past Surgical History  Procedure Laterality Date  . Eye surgery     History reviewed. No pertinent family history. History  Substance Use Topics  . Smoking status: Never Smoker   . Smokeless tobacco: Not on file  . Alcohol Use: No   OB History   Grav Para Term Preterm Abortions TAB SAB Ect Mult Living                 Review of Systems  Constitutional:       Per HPI, otherwise negative  HENT:       Per HPI, otherwise negative  Respiratory:       Per HPI, otherwise negative  Cardiovascular:       Per HPI, otherwise negative  Gastrointestinal: Positive for nausea and vomiting. Negative for abdominal pain and diarrhea.  Endocrine:       Negative aside from HPI   Genitourinary:       Neg aside from HPI   Musculoskeletal:       Per HPI, otherwise negative  Skin: Negative.   Neurological: Positive for headaches. Negative for syncope and weakness.      Allergies  Tomato  Home Medications  No current outpatient prescriptions on file. BP 108/78  Pulse 90  Temp(Src) 98.3 F (36.8 C) (Oral)  Resp 24  Ht 5\' 4"  (1.626 m)  Wt 120 lb (54.432 kg)  BMI 20.59 kg/m2  SpO2 100%  LMP 01/26/2014 Physical Exam  Nursing note and vitals reviewed. Constitutional: She is oriented to person, place, and time. She appears well-developed and well-nourished. No distress.  HENT:  Head: Normocephalic and atraumatic.  Eyes: Conjunctivae and EOM are normal.  Cardiovascular: Normal rate and regular rhythm.   Pulmonary/Chest: Effort normal and breath sounds normal. No stridor. No respiratory distress.  Abdominal: She exhibits no distension. There is no tenderness. There is no rebound.  Musculoskeletal: She exhibits no edema.  Neurological: She is alert and oriented to person, place, and time. No cranial nerve deficit. She exhibits normal muscle tone. Coordination normal.  Skin: Skin is warm and dry.  Innumerable tattoos  Psychiatric: She has a normal mood and affect.    ED Course  Procedures (including critical care time) Labs Review Labs Reviewed  CBC WITH DIFFERENTIAL  COMPREHENSIVE METABOLIC PANEL  URINALYSIS,  ROUTINE W REFLEX MICROSCOPIC  LIPASE, BLOOD   Imaging Review No results found.  EKG Interpretation   None       MDM   Final diagnoses:  None    Young female presents with concerns no nausea, vomiting, and on exam is afebrile, hemodynamically stable, in no distress.  This labs are reassuring, she improved here, she was appropriate for discharge.  Notably, the patient also complained of headache, though this is a chronic condition, with no headache today.  This was provided resources for further evaluation of her headaches as an  outpatient.    Carmin Muskrat, MD 01/31/14 1539

## 2014-01-31 NOTE — ED Notes (Signed)
PO fluids given

## 2014-01-31 NOTE — ED Notes (Addendum)
Pt is tolerating po fluids well. Pt has not vomited and has had two cups of sprite. Pt has also eaten a large pack of nabs and has asked for a second pack.

## 2014-01-31 NOTE — Discharge Instructions (Signed)
As discussed, it is important that you follow up as soon as possible with your physicians for continued management of your condition.  If you develop any new, or concerning changes in your condition, please return to the emergency department immediately.   Nausea and Vomiting Nausea is a sick feeling that often comes before throwing up (vomiting). Vomiting is a reflex where stomach contents come out of your mouth. Vomiting can cause severe loss of body fluids (dehydration). Children and elderly adults can become dehydrated quickly, especially if they also have diarrhea. Nausea and vomiting are symptoms of a condition or disease. It is important to find the cause of your symptoms. CAUSES   Direct irritation of the stomach lining. This irritation can result from increased acid production (gastroesophageal reflux disease), infection, food poisoning, taking certain medicines (such as nonsteroidal anti-inflammatory drugs), alcohol use, or tobacco use.  Signals from the brain.These signals could be caused by a headache, heat exposure, an inner ear disturbance, increased pressure in the brain from injury, infection, a tumor, or a concussion, pain, emotional stimulus, or metabolic problems.  An obstruction in the gastrointestinal tract (bowel obstruction).  Illnesses such as diabetes, hepatitis, gallbladder problems, appendicitis, kidney problems, cancer, sepsis, atypical symptoms of a heart attack, or eating disorders.  Medical treatments such as chemotherapy and radiation.  Receiving medicine that makes you sleep (general anesthetic) during surgery. DIAGNOSIS Your caregiver may ask for tests to be done if the problems do not improve after a few days. Tests may also be done if symptoms are severe or if the reason for the nausea and vomiting is not clear. Tests may include:  Urine tests.  Blood tests.  Stool tests.  Cultures (to look for evidence of infection).  X-rays or other imaging  studies. Test results can help your caregiver make decisions about treatment or the need for additional tests. TREATMENT You need to stay well hydrated. Drink frequently but in small amounts.You may wish to drink water, sports drinks, clear broth, or eat frozen ice pops or gelatin dessert to help stay hydrated.When you eat, eating slowly may help prevent nausea.There are also some antinausea medicines that may help prevent nausea. HOME CARE INSTRUCTIONS   Take all medicine as directed by your caregiver.  If you do not have an appetite, do not force yourself to eat. However, you must continue to drink fluids.  If you have an appetite, eat a normal diet unless your caregiver tells you differently.  Eat a variety of complex carbohydrates (rice, wheat, potatoes, bread), lean meats, yogurt, fruits, and vegetables.  Avoid high-fat foods because they are more difficult to digest.  Drink enough water and fluids to keep your urine clear or pale yellow.  If you are dehydrated, ask your caregiver for specific rehydration instructions. Signs of dehydration may include:  Severe thirst.  Dry lips and mouth.  Dizziness.  Dark urine.  Decreasing urine frequency and amount.  Confusion.  Rapid breathing or pulse. SEEK IMMEDIATE MEDICAL CARE IF:   You have blood or brown flecks (like coffee grounds) in your vomit.  You have black or bloody stools.  You have a severe headache or stiff neck.  You are confused.  You have severe abdominal pain.  You have chest pain or trouble breathing.  You do not urinate at least once every 8 hours.  You develop cold or clammy skin.  You continue to vomit for longer than 24 to 48 hours.  You have a fever. MAKE SURE YOU:  Understand these instructions.  Will watch your condition.  Will get help right away if you are not doing well or get worse. Document Released: 12/03/2005 Document Revised: 02/25/2012 Document Reviewed:  05/02/2011 [facility] Patient Information 2014 [place], [place].

## 2014-05-01 ENCOUNTER — Encounter (HOSPITAL_COMMUNITY): Payer: Self-pay | Admitting: Emergency Medicine

## 2014-05-01 ENCOUNTER — Emergency Department (HOSPITAL_COMMUNITY)
Admission: EM | Admit: 2014-05-01 | Discharge: 2014-05-01 | Disposition: A | Discharge status: Home / Self Care | Payer: Self-pay | Attending: Emergency Medicine | Admitting: Emergency Medicine

## 2014-05-01 ENCOUNTER — Emergency Department (HOSPITAL_COMMUNITY): Payer: Self-pay

## 2014-05-01 DIAGNOSIS — R1110 Vomiting, unspecified: Secondary | ICD-10-CM | POA: Insufficient documentation

## 2014-05-01 DIAGNOSIS — G8929 Other chronic pain: Secondary | ICD-10-CM | POA: Insufficient documentation

## 2014-05-01 DIAGNOSIS — J069 Acute upper respiratory infection, unspecified: Secondary | ICD-10-CM | POA: Insufficient documentation

## 2014-05-01 DIAGNOSIS — J45909 Unspecified asthma, uncomplicated: Secondary | ICD-10-CM | POA: Insufficient documentation

## 2014-05-01 LAB — RAPID STREP SCREEN (MED CTR MEBANE ONLY): STREPTOCOCCUS, GROUP A SCREEN (DIRECT): NEGATIVE

## 2014-05-01 MED ORDER — IPRATROPIUM-ALBUTEROL 0.5-2.5 (3) MG/3ML IN SOLN
3.0000 mL | Freq: Once | RESPIRATORY_TRACT | Status: AC
  Administered 2014-05-01: 3 mL via RESPIRATORY_TRACT
  Filled 2014-05-01: qty 3

## 2014-05-01 MED ORDER — BENZONATATE 100 MG PO CAPS: 100.0000 mg | ORAL_CAPSULE | Freq: Three times a day (TID) | ORAL | Status: DC | PRN

## 2014-05-01 MED ORDER — ALBUTEROL SULFATE HFA 108 (90 BASE) MCG/ACT IN AERS
2.0000 | INHALATION_SPRAY | RESPIRATORY_TRACT | Status: AC
  Administered 2014-05-01: 2 via RESPIRATORY_TRACT
  Filled 2014-05-01: qty 6.7

## 2014-05-01 NOTE — ED Provider Notes (Signed)
CSN: [id number]     Arrival date & time 05/01/14  1618 History   First MD Initiated Contact with Patient 05/01/14 1647     Chief Complaint  Patient presents with  . Sore Throat  . Cough  . Emesis     HPI Pt was seen at 1645.  Per pt, c/o gradual onset and persistence of constant sore throat, runny/stuffy nose, sinus congestion, "chills," and cough for the past 1 week.  Denies objective fevers, no rash, no CP/SOB, no N/V/D, no abd pain.     Past Medical History  Diagnosis Date  . Asthma   . Chronic headaches    Past Surgical History  Procedure Laterality Date  . Eye surgery     Family History  Problem Relation Age of Onset  . Diabetes Other    History  Substance Use Topics  . Smoking status: Never Smoker   . Smokeless tobacco: Never Used  . Alcohol Use: No   OB History   Grav Para Term Preterm Abortions TAB SAB Ect Mult Living   1 1 1       1      Review of Systems ROS: Statement: All systems negative except as marked or noted in the HPI; Constitutional: Negative for objective fever and +chills. ; ; Eyes: Negative for eye pain, redness and discharge. ; ; ENMT: Negative for ear pain, hoarseness, +nasal congestion, sinus pressure and sore throat. ; ; Cardiovascular: Negative for chest pain, palpitations, diaphoresis, dyspnea and peripheral edema. ; ; Respiratory: +cough. Negative for wheezing and stridor. ; ; Gastrointestinal: Negative for nausea, vomiting, diarrhea, abdominal pain, blood in stool, hematemesis, jaundice and rectal bleeding. . ; ; Genitourinary: Negative for dysuria, flank pain and hematuria. ; ; Musculoskeletal: Negative for back pain and neck pain. Negative for swelling and trauma.; ; Skin: Negative for pruritus, rash, abrasions, blisters, bruising and skin lesion.; ; Neuro: Negative for headache, lightheadedness and neck stiffness. Negative for weakness, altered level of consciousness , altered mental status, extremity weakness, paresthesias, involuntary  movement, seizure and syncope.      Allergies  Tomato  Home Medications   Prior to Admission medications   Medication Sig Start Date End Date Taking? Authorizing Provider  ondansetron (ZOFRAN-ODT) 4 MG disintegrating tablet Take 1 tablet (4 mg total) by mouth once. 01/31/14   Carmin Muskrat, MD   BP 114/77  Pulse 90  Temp(Src) 98.5 F (36.9 C) (Oral)  Resp 20  Ht 5' (1.524 m)  Wt 150 lb (68.04 kg)  BMI 29.30 kg/m2  SpO2 100%  LMP 04/16/2014 Physical Exam 1650: Physical examination:  Nursing notes reviewed; Vital signs and O2 SAT reviewed;  Constitutional: Well developed, Well nourished, Well hydrated, In no acute distress; Head:  Normocephalic, atraumatic; Eyes: EOMI, PERRL, No scleral icterus; ENMT: TM's clear bilat. +edemetous nasal turbinates bilat with clear rhinorrhea. Mouth and pharynx without lesions. No tonsillar exudates. No intra-oral edema. No submandibular or sublingual edema. No hoarse voice, no drooling, no stridor. No pain with manipulation of larynx. No trismus. Mouth and pharynx normal, Mucous membranes moist.; Neck: Supple, Full range of motion, No lymphadenopathy; Cardiovascular: Regular rate and rhythm, No murmur, rub, or gallop; Respiratory: Breath sounds coarse & equal bilaterally, No wheezes.  Speaking full sentences with ease, Normal respiratory effort/excursion; Chest: Nontender, Movement normal; Abdomen: Soft, Nontender, Nondistended, Normal bowel sounds; Genitourinary: No CVA tenderness; Extremities: Pulses normal, No tenderness, No edema, No calf edema or asymmetry.; Neuro: AA&Ox3, Major CN grossly intact.  Speech clear. No gross  focal motor or sensory deficits in extremities. Climbs on and off stretcher easily by himself. Gait steady.; Skin: Color normal, Warm, Dry.   ED Course  Procedures   EKG Interpretation None      MDM  MDM Reviewed: previous chart, nursing note and vitals Interpretation: labs and x-ray    Results for orders placed during  the hospital encounter of 05/01/14  RAPID STREP SCREEN      Result Value Ref Range   Streptococcus, Group A Screen (Direct) NEGATIVE  NEGATIVE   Dg Chest 2 View 05/01/2014   CLINICAL DATA:  Cough, congestion, shortness of breath  EXAM: CHEST  2 VIEW  COMPARISON:  November 20, 2011  FINDINGS: The heart size and mediastinal contours are within normal limits. Both lungs are clear. The visualized skeletal structures are unremarkable.  IMPRESSION: No active cardiopulmonary disease.   Electronically Signed   By: Abelardo Diesel M.D.   On: 05/01/2014 18:28    1910:  Pt states she "feels better" after neb.  NAD, lungs CTA bilat, no wheezing, resps easy, speaking full sentences, Sats 100% R/A.  Pt states she feels better and wants to go home now. Requesting work note for tomorrow. Will tx URI symptomatically at this time. Dx and testing d/w pt.  Questions answered.  Verb understanding, agreeable to d/c home with outpt f/u.    Alfonzo Feller, DO 05/03/14 [phone]

## 2014-05-01 NOTE — ED Notes (Signed)
Patient c/o sore throat, cough, vomiting, chest soreness, headaches, diarrhea, and fevers.

## 2014-05-01 NOTE — Discharge Instructions (Signed)
°Emergency Department Resource Guide °1) Find a Doctor and Pay Out of Pocket °Although you won't have to find out who is covered by your insurance plan, it is a good idea to ask around and get recommendations. You will then need to call the office and see if the doctor you have chosen will accept you as a new patient and what types of options they offer for patients who are self-pay. Some doctors offer discounts or will set up payment plans for their patients who do not have insurance, but you will need to ask so you aren't surprised when you get to your appointment. ° °2) Contact Your Local Health Department °Not all health departments have doctors that can see patients for sick visits, but many do, so it is worth a call to see if yours does. If you don't know where your local health department is, you can check in your phone book. The CDC also has a tool to help you locate your state's health department, and many state websites also have listings of all of their local health departments. ° °3) Find a Walk-in Clinic °If your illness is not likely to be very severe or complicated, you may want to try a walk in clinic. These are popping up all over the country in pharmacies, drugstores, and shopping centers. They're usually staffed by nurse practitioners or physician assistants that have been trained to treat common illnesses and complaints. They're usually fairly quick and inexpensive. However, if you have serious medical issues or chronic medical problems, these are probably not your best option. ° °No Primary Care Doctor: °- Call [facility] at  832-8000 - they can help you locate a primary care doctor that  accepts your insurance, provides certain services, etc. °- Physician Referral Service- 1-800-533-3463 ° °Chronic Pain Problems: °Organization         Address  Phone   Notes  °[facility]  (336) 297-2271 Patients need to be referred by their primary care doctor.  ° °Medication  Assistance: °Organization         Address  Phone   Notes  °[place] [facility] [address] °[place], [place] [address] (336) 641-8030 --Must be a resident of [place] °-- Must have NO insurance coverage whatsoever (no Medicaid/ Medicare, etc.) °-- The pt. MUST have a primary care doctor that directs their care regularly and follows them in the community °  °[facility]  (866) 331-1348   °[employer]  (888) 892-1162   ° °Agencies that provide inexpensive medical care: °Organization         Address  Phone   Notes  °[facility]  (336) 832-8035   °[facility]    (336) 832-7272   °[facility] [address] °[place], [place] [address] (336) 832-4777   °[facility] [address], °[place] (336) 271-4999   °[facility]    (336) 373-0678   °[facility]    (336) 272-1050   °[facility] ° [address], [place] Phone:  (336) 832-4444, Fax:  (336) 832-4440 Hours of Operation:  9 am - 6 pm, M-F.  Also accepts Medicaid/Medicare and self-pay.  °[facility] ° [address], [place] Phone: (336) 832-3150, Fax: (336) [phone]. Hours of Operation:  8:30 am - 5:30 pm, M-F.  Also accepts Medicaid and self-pay.  °[facility] [place] [address], [place] Phone: (336) 878-6027   °[facility] [address], [place], [place] (336)723-1848, Ext. 123 Mondays & Thursdays: 7-9 AM.  First 15 patients are seen on a first come, first serve basis. °  ° °Medicaid-accepting [place] Providers: ° °Organization         Address  Phone   Notes  °[facility] [address], [place] (336) 641-2100 Also accepts self-pay patients.  °[facility] [address], [place] ° (336) 856-9996   °[facility] [address], [place]  (336) 288-8857   °[facility] [address] [place] [address], [place] (336) 299-7000   °Veita Bland [address], [place]  ° (336) 373-1557 Only accepts [place] Access Medicaid patients after they have their name applied to their card.  ° °Self-Pay (no insurance) in [place]: ° °Organization         Address  Phone   Notes  °Sickle Cell Patients, [facility] [address], [place] (336) 832-1970   °[facility] [address], [place] (336) 832-4400   °[facility] [place] [facility] ° [address], [place] (336) 992-4800   °[facility]/Dr. Osei-Bonsu ° [address] [place] [address], [place] or [address], [place] (336) [phone] Phone number for both [place] and [place] locations is the same.  °[facility] [address], [place] (336) 299-0000   °[facility] [address] [place] [address], [place] or [address] (336) 852-7530 °(336) 878-2260   °[facility] [address], [place] (336) 350-1642, phone; (336) 294-5005, fax Sees patients 1st and 3rd Saturday of every month.  Must not qualify for public or private insurance (i.e. Medicaid, Medicare, [place] Health Choice, Veterans' Benefits) • Household income should be no more than 200% of the poverty level •The clinic cannot treat you if you are pregnant or think you are pregnant • Sexually transmitted diseases are not treated at the clinic.  ° ° °Dental Care: °Organization         Address  Phone  Notes  °[place] [facility] [address], [place] (336) 641-6152 Accepts children up to age 21 who are enrolled in Medicaid or [place] Health Choice; pregnant women with a Medicaid card; and children who have applied for Medicaid or [place] Health Choice, but were declined, whose parents can pay a reduced fee at time of service.  °[place]  [facility] [place]  [address], [place] (336) 641-7733 Accepts children up to age 21 who are enrolled in Medicaid or [place] Health Choice; pregnant women with a Medicaid card; and children who have applied for Medicaid or [place] Health Choice, but were declined, whose parents can pay a reduced fee at time of service.  °[facility] ° [address], [place] (336) 641-4533 Patients are seen by appointment only. Walk-ins are not accepted. [facility] will see patients 18 years of age and older. °Monday - Tuesday (8am-5pm) °Most Wednesdays (8:30-5pm) °$30 per visit, cash only  °[facility] ° [address], [place] (336) 641-4533 Patients are seen by appointment only. Walk-ins are not accepted. [facility] will see patients 18 years of age and older. °One   Wednesday Evening (Monthly: Volunteer Based).  $30 per visit, cash only  °[facility]  (919) 537-3737 for adults; Children under age 4, call Graduate Pediatric Dentistry at (919) [phone]. Children aged 4-14, please call ([phone]) [phone] to request a pediatric application. ° Dental services are provided in all areas of dental care including fillings, crowns and bridges, complete and partial dentures, implants, gum treatment, root canals, and extractions. Preventive care is also provided. Treatment is provided to both adults and children. °Patients are selected via a lottery and there is often a waiting list. °  °[facility] [address], °[place] ° (336) 763-8833 www.drcivils.com °  °[facility] [address], [place], [name] (336)723-1848, Ext. 123 Second and Fourth Thursday of each month, opens at 6:30 AM; Clinic ends at 9 AM.  Patients are seen on a first-come first-served basis, and a limited number are seen during each clinic.  ° °[facility] ° [address], [place], [place] (336) 723-7904    Eligibility Requirements °You must have lived in [place], [place], or [place] counties for at least the last three months. °  You cannot be eligible for state or federal sponsored healthcare insurance, including Veterans Administration, Medicaid, or Medicare. °  You generally cannot be eligible for healthcare insurance through your employer.  °  How to apply: °Eligibility screenings are held every Tuesday and Wednesday afternoon from 1:00 pm until 4:00 pm. You do not need an appointment for the interview!  °[facility] [address], [place], [place] 336-631-2330   °[facility]  336-342-8273   °[facility]  336-703-3100   °[facility]  336-570-6415   ° °Behavioral Health Resources in the Community: °Intensive Outpatient Programs °Organization         Address  Phone  Notes  °[facility] [address], [place], [place] 336-878-6098   °[facility] [address], [place], [place] 336-832-9800   °[facility] [address], [place], [place] ° 336-882-2125   °[facility] [address],  °[place], [place] 1-800-853-5163 or 336-641-4981   °Substance Abuse Resources °Organization         Address  Phone  Notes  °[facility]  336-882-2125   °[facility]  336-784-9470   °[facility]  336-285-9073   °[facility]  336-845-3988   °Residential & Outpatient Substance Abuse Program  1-800-659-3381   °Psychological Services °Organization         Address  Phone  Notes  °[facility]  336- 832-9600   °[facility]  336- 378-7881   °[facility] [address], [place] 1-800-853-5163 or 336-641-4981   ° °Mobile Crisis Teams °Organization         Address  Phone  Notes  °[facility], Mobile Crisis Care Unit  1-877-626-1772   °Assertive °[facility] ° [address]  [place], [place] 336-834-9664   °Sharon DeEsch [address] °[place] 336-554-5454   ° °Self-Help/Support Groups °Organization         Address  Phone             Notes  °[facility] - variety of support groups  336- 373-1402 Call for more information  °Narcotics Anonymous (NA), [facility] [address], °[place]  2 meetings at this location  ° °  Residential Treatment Programs Organization         Address  Phone  Notes  [facility] [address]    [place]  1-860-873-4072   [facility]  [address], [place] [address], [place], [place]   [place], [place] (205)848-5621 Admissions: 8am-3pm M-F  [facility] [place] [address]    [place], [place] 166-063-0160   [facility] [address] [place], [place], [place]   [facility] [address]  [place], [place]   [facility] - Intensive Outpatient [address], [place], [place]   [facility] ([place] [address]  [place], [place] 1-540 185 6101 or 678-209-1712   [facility] ([facility] [address] [place], [place] Accepts Medicaid  [facility] [address],  [place] 1-214-011-7934 Substance Abuse/Addiction Treatment   [facility] Organization         Address  Phone  Notes  [facility]  [phone]   Domenic Schwab, PhD [address] [place], [place]   6840302621 or 681-447-1828   [place], [place] 442-150-3255   [facility] [address] [place], [place] [phone] Insurance/Medicaid/sponsorship through [facility] [address]                                    [place], [place] [phone] [facility] [address], [place] [phone]    Dr. Adele Schilder  [phone]   [facility] Dept. 1) [address], [place] 2) [place] 3)  [address], [place] ([phone]) [phone] [phone]  [phone]   [place] (205)[phone] or (640)[phone] (After Hours)       Take over the counter decongestant (such as sudafed) and an anti-histamine (such as claritin or zyrtec), as directed on packaging, for the next week.  Use over the counter normal saline nasal spray, as instructed in the Emergency Department, several times per day for the next 2 weeks. Use your albuterol inhaler (2 to 4 puffs) every 4 hours for the next 7 days, then as needed for cough, wheezing, or shortness of breath. Take over the counter tylenol and ibuprofen, as directed on packaging, as needed for discomfort.  Gargle with warm water several times per day to help with discomfort.  May also use over the counter sore throat pain medicines such as chloraseptic or sucrets, as directed on packaging, as needed for discomfort.  Call your regular medical doctor Monday morning to schedule a follow up appointment within the next 3 days.  Return to the Emergency Department immediately sooner if worsening.    Reye Syndrome, Pediatric Reye syndrome is a condition of sudden brain disease and fatty infiltration of the liver. Reye syndrome most often affects children and teenagers recovering from a viral infection, most commonly the flu or chickenpox. CAUSES The cause of Reye syndrome is unknown. However, it typically occurs after infection with specific viruses. RISK FACTORS Risk factors for Reye syndrome include:  Age younger than 18 years.  Use of aspirin to  treat symptoms of a viral infection in people aged 31 years and younger. SIGNS AND SYMPTOMS  Symptoms of Reye syndrome that your child may have  include:   Feeling very sick to to his or her stomach (severe nausea).  Vomiting.  Confusion. DIAGNOSIS  Your child's health care provider will do a physical exam. In some cases an exam of a tissue sample (biopsy) from your your child's liver will confirm Rey syndrome. TREATMENT  Treatments for Reye syndrome include procedures that help the patient to breathe (endotracheal intubation and artificial ventilation). SEEK MEDICAL CARE IF:  Your child experiences the following after having had the flu or chicken pox:  Repeated vomiting.  Unusually sleepiness or fatigue.  Sudden behavior changes. SEEK IMMEDIATE MEDICAL CARE IF :  If your child has seizures.  If your child develops weakness or paralysis in the arms or legs.  If your child passes out. Document Released: 11/30/2000 Document Revised: 09/23/2013 Document Reviewed: 08/10/2013 [facility] Patient Information 2014 [place].

## 2014-05-04 LAB — CULTURE, GROUP A STREP

## 2014-08-28 ENCOUNTER — Emergency Department (HOSPITAL_COMMUNITY)
Admission: EM | Admit: 2014-08-28 | Discharge: 2014-08-28 | Disposition: A | Discharge status: Home / Self Care | Payer: Self-pay | Attending: Emergency Medicine | Admitting: Emergency Medicine

## 2014-08-28 ENCOUNTER — Encounter (HOSPITAL_COMMUNITY): Payer: Self-pay | Admitting: Emergency Medicine

## 2014-08-28 DIAGNOSIS — L089 Local infection of the skin and subcutaneous tissue, unspecified: Secondary | ICD-10-CM | POA: Insufficient documentation

## 2014-08-28 DIAGNOSIS — S1096XA Insect bite of unspecified part of neck, initial encounter: Secondary | ICD-10-CM | POA: Insufficient documentation

## 2014-08-28 DIAGNOSIS — S40861A Insect bite (nonvenomous) of right upper arm, initial encounter: Secondary | ICD-10-CM

## 2014-08-28 DIAGNOSIS — IMO0001 Reserved for inherently not codable concepts without codable children: Secondary | ICD-10-CM | POA: Insufficient documentation

## 2014-08-28 DIAGNOSIS — IMO0002 Reserved for concepts with insufficient information to code with codable children: Secondary | ICD-10-CM | POA: Insufficient documentation

## 2014-08-28 DIAGNOSIS — G8929 Other chronic pain: Secondary | ICD-10-CM | POA: Insufficient documentation

## 2014-08-28 DIAGNOSIS — J45909 Unspecified asthma, uncomplicated: Secondary | ICD-10-CM | POA: Insufficient documentation

## 2014-08-28 DIAGNOSIS — T148 Other injury of unspecified body region: Principal | ICD-10-CM

## 2014-08-28 DIAGNOSIS — Y929 Unspecified place or not applicable: Secondary | ICD-10-CM | POA: Insufficient documentation

## 2014-08-28 DIAGNOSIS — W57XXXA Bitten or stung by nonvenomous insect and other nonvenomous arthropods, initial encounter: Principal | ICD-10-CM | POA: Insufficient documentation

## 2014-08-28 DIAGNOSIS — Y939 Activity, unspecified: Secondary | ICD-10-CM | POA: Insufficient documentation

## 2014-08-28 MED ORDER — SULFAMETHOXAZOLE-TMP DS 800-160 MG PO TABS
1.0000 | ORAL_TABLET | Freq: Once | ORAL | Status: AC
  Administered 2014-08-28: 1 via ORAL
  Filled 2014-08-28: qty 1

## 2014-08-28 MED ORDER — SULFAMETHOXAZOLE-TRIMETHOPRIM 800-160 MG PO TABS: 1.0000 | ORAL_TABLET | Freq: Two times a day (BID) | ORAL | Status: DC

## 2014-08-28 NOTE — ED Provider Notes (Signed)
CSN: [id number]     Arrival date & time 08/28/14  1526 History   First MD Initiated Contact with Patient 08/28/14 1616     Chief Complaint  Patient presents with  . Cyst     (Consider location/radiation/quality/duration/timing/severity/associated sxs/prior Treatment) HPI   MIZANI DILDAY is a 23 y.o. female who presents to the Emergency Department complaining of several painful, itchy "bumps" to her right forearm, chin, and right foot.  She reports noticing a small "bump" that has popped and draining fluid.  She has been applying triamcinolone cream that she had left from a previous prescription.  She denies fever, chills, pain with arm or elbow movement, swelling or numbness.     Past Medical History  Diagnosis Date  . Asthma   . Chronic headaches    Past Surgical History  Procedure Laterality Date  . Eye surgery     Family History  Problem Relation Age of Onset  . Diabetes Other    History  Substance Use Topics  . Smoking status: Never Smoker   . Smokeless tobacco: Never Used  . Alcohol Use: No   OB History   Grav Para Term Preterm Abortions TAB SAB Ect Mult Living   1 1 1       1      Review of Systems  Constitutional: Negative for fever and chills.  Gastrointestinal: Negative for nausea and vomiting.  Musculoskeletal: Negative for arthralgias and joint swelling.  Skin:       Several small "red bumps" to the right forearm  Hematological: Negative for adenopathy.  All other systems reviewed and are negative.     Allergies  Tomato  Home Medications   Prior to Admission medications   Medication Sig Start Date End Date Taking? Authorizing Provider  sulfamethoxazole-trimethoprim (SEPTRA DS) 800-160 MG per tablet Take 1 tablet by mouth 2 (two) times daily. For 10 days 08/28/14   Carroll Lingelbach L. Christiona Siddique, PA-C   BP 122/80  Pulse 111  Temp(Src) 97.9 F (36.6 C) (Oral)  Resp 16  SpO2 100%  LMP 07/31/2014 Physical Exam  Nursing note and vitals  reviewed. Constitutional: She is oriented to person, place, and time. She appears well-developed and well-nourished. No distress.  HENT:  Head: Normocephalic and atraumatic.  Mouth/Throat: Oropharynx is clear and moist.  Neck: Normal range of motion. Neck supple.  Cardiovascular: Normal rate, regular rhythm, normal heart sounds and intact distal pulses.   No murmur heard. Pulmonary/Chest: Effort normal and breath sounds normal. No respiratory distress.  Musculoskeletal: She exhibits no edema and no tenderness.  No edema of the RUE, pt has full ROM of the right wrist and elbow.  No surrounding erythema.  Distal sensation intact, radial pulse brisk  Lymphadenopathy:    She has no cervical adenopathy.  Neurological: She is alert and oriented to person, place, and time. She exhibits normal muscle tone. Coordination normal.  Skin: Skin is warm. Rash noted. There is erythema.  Erythematous papule to the right proximal forearm with mild serous appearing drainage.  Several smaller papules to the chin, right wrist and foot that appear similar but without drainage.  No vesicles or pustules noted.      ED Course  Procedures (including critical care time) Labs Review Labs Reviewed - No data to display  Imaging Review No results found.   EKG Interpretation None      MDM   Final diagnoses:  Infected insect bite of right arm, initial encounter    Patient well appearing, non-toxic.  Has full ROM of the bilateral upper and lower extremities.  Lesions appears c/w insect bites.  Small amt of surrounding erythema noted to the right proximal forearm.  Pt agrees to warm soaks, rx for bactrim and advised to return here in 2-3 days for any signs of worsening infection.      Mykell Rawl L. Vanessa Sachse, PA-C 08/30/14 1617

## 2014-08-28 NOTE — Discharge Instructions (Signed)

## 2014-08-28 NOTE — ED Notes (Signed)
Pt reports several "bumps" noted to right forearm. Pt denies any known insect bites. nad noted. Pt denies any fever/v/d.

## 2014-08-31 NOTE — ED Provider Notes (Signed)
Medical screening examination/treatment/procedure(s) were performed by non-physician practitioner and as supervising physician I was immediately available for consultation/collaboration.   EKG Interpretation None        Maudry Diego, MD 08/31/14 1353

## 2014-10-18 ENCOUNTER — Encounter (HOSPITAL_COMMUNITY): Payer: Self-pay | Admitting: Emergency Medicine

## 2015-02-01 ENCOUNTER — Emergency Department (HOSPITAL_COMMUNITY)
Admission: EM | Admit: 2015-02-01 | Discharge: 2015-02-01 | Disposition: A | Discharge status: Home / Self Care | Payer: Self-pay | Attending: Emergency Medicine | Admitting: Emergency Medicine

## 2015-02-01 ENCOUNTER — Emergency Department (HOSPITAL_COMMUNITY): Payer: Self-pay

## 2015-02-01 ENCOUNTER — Encounter (HOSPITAL_COMMUNITY): Payer: Self-pay | Admitting: Emergency Medicine

## 2015-02-01 DIAGNOSIS — M791 Myalgia: Secondary | ICD-10-CM | POA: Insufficient documentation

## 2015-02-01 DIAGNOSIS — G8929 Other chronic pain: Secondary | ICD-10-CM | POA: Insufficient documentation

## 2015-02-01 DIAGNOSIS — J40 Bronchitis, not specified as acute or chronic: Secondary | ICD-10-CM

## 2015-02-01 DIAGNOSIS — R000 Tachycardia, unspecified: Secondary | ICD-10-CM | POA: Insufficient documentation

## 2015-02-01 DIAGNOSIS — J45909 Unspecified asthma, uncomplicated: Secondary | ICD-10-CM | POA: Insufficient documentation

## 2015-02-01 DIAGNOSIS — R197 Diarrhea, unspecified: Secondary | ICD-10-CM | POA: Insufficient documentation

## 2015-02-01 DIAGNOSIS — R110 Nausea: Secondary | ICD-10-CM | POA: Insufficient documentation

## 2015-02-01 DIAGNOSIS — H9209 Otalgia, unspecified ear: Secondary | ICD-10-CM | POA: Insufficient documentation

## 2015-02-01 LAB — RAPID STREP SCREEN (MED CTR MEBANE ONLY): Streptococcus, Group A Screen (Direct): NEGATIVE

## 2015-02-01 MED ORDER — HYDROCODONE-ACETAMINOPHEN 5-325 MG PO TABS: 1.0000 | ORAL_TABLET | ORAL | Status: DC | PRN

## 2015-02-01 MED ORDER — AZITHROMYCIN 250 MG PO TABS: ORAL_TABLET | ORAL | Status: DC

## 2015-02-01 MED ORDER — PSEUDOEPHEDRINE HCL 30 MG PO TABS: 30.0000 mg | ORAL_TABLET | Freq: Four times a day (QID) | ORAL | Status: DC | PRN

## 2015-02-01 NOTE — ED Notes (Signed)
Pt states that she has been having a sore throat, nasal congestion, cough, and headache for the past 3 days or so.

## 2015-02-01 NOTE — ED Provider Notes (Signed)
CSN: [id number]     Arrival date & time 02/01/15  7408 History   First MD Initiated Contact with Patient 02/01/15 [phone]     Chief Complaint  Patient presents with  . Nasal Congestion  . Sore Throat     (Consider location/radiation/quality/duration/timing/severity/associated sxs/prior Treatment) Patient is a 24 y.o. female presenting with pharyngitis. The history is provided by the patient.  Sore Throat This is a new problem. The current episode started in the past 7 days. The problem occurs constantly. The problem has been gradually worsening. Associated symptoms include chills, congestion, coughing, a fever (?), myalgias, nausea and a sore throat. She has tried nothing for the symptoms.   GWENDOLYN MCLEES is a 24 y.o. female who presents to the ED with nasal congestion, sore throat. productive cough yellow with streaks of blood. She reports loose watery brown stools x 2 days, 3 times a day. She does not know of anyone with similar symptoms that she has been exposed to.   Past Medical History  Diagnosis Date  . Asthma   . Chronic headaches    Past Surgical History  Procedure Laterality Date  . Eye surgery     Family History  Problem Relation Age of Onset  . Diabetes Other    History  Substance Use Topics  . Smoking status: Never Smoker   . Smokeless tobacco: Never Used  . Alcohol Use: No   OB History    Gravida Para Term Preterm AB TAB SAB Ectopic Multiple Living   1 1 1       1      Review of Systems  Constitutional: Positive for fever (?) and chills.  HENT: Positive for congestion, ear pain, rhinorrhea, sinus pressure and sore throat.   Respiratory: Positive for cough.   Gastrointestinal: Positive for nausea and diarrhea.  Musculoskeletal: Positive for myalgias.  all other systems negative    Allergies  Tomato  Home Medications   Prior to Admission medications   Medication Sig Start Date End Date Taking? Authorizing Provider  azithromycin (ZITHROMAX Z-PAK) 250  MG tablet Take 2 tablets today and then one tablet PO daily 02/01/15   [facility] Bunnie Pion, NP  HYDROcodone-acetaminophen (NORCO/VICODIN) 5-325 MG per tablet Take 1 tablet by mouth every 4 (four) hours as needed. 02/01/15   Taylia Berber Bunnie Pion, NP  pseudoephedrine (SUDAFED) 30 MG tablet Take 1 tablet (30 mg total) by mouth every 6 (six) hours as needed for congestion. 02/01/15   Sheva Mcdougle Bunnie Pion, NP   BP 110/75 mmHg  Pulse 99  Temp(Src) 98.9 F (37.2 C) (Oral)  Resp 16  Ht 5' (1.524 m)  Wt 160 lb (72.576 kg)  BMI 31.25 kg/m2  SpO2 99%  LMP 01/13/2015 Physical Exam  Constitutional: She is oriented to person, place, and time. She appears well-developed and well-nourished. No distress.  HENT:  Head: Normocephalic.  Right Ear: Tympanic membrane normal.  Left Ear: Tympanic membrane normal.  Nose: Mucosal edema and rhinorrhea present.  Mouth/Throat: Uvula is midline and mucous membranes are normal. Posterior oropharyngeal erythema present.  Eyes: EOM are normal.  Neck: Normal range of motion. Neck supple.  Cardiovascular: Regular rhythm.  Tachycardia present.   Pulmonary/Chest: Effort normal. No respiratory distress. She has no wheezes. She has no rales.  Abdominal: Soft. Bowel sounds are normal. There is no tenderness.  Musculoskeletal: Normal range of motion.  Lymphadenopathy:    She has cervical adenopathy.  Neurological: She is alert and oriented to person, place, and time. No cranial  nerve deficit.  Skin: Skin is warm and dry.  Psychiatric: She has a normal mood and affect. Her behavior is normal.  Nursing note and vitals reviewed.  Results for orders placed or performed during the hospital encounter of 02/01/15 (from the past 24 hour(s))  Rapid strep screen     Status: None   Collection Time: 02/01/15 10:31 AM  Result Value Ref Range   Streptococcus, Group A Screen (Direct) NEGATIVE NEGATIVE   Dg Chest 2 View  02/01/2015   CLINICAL DATA:  Cough and fever  EXAM: CHEST  2 VIEW  COMPARISON:   05/01/2014  FINDINGS: The heart size and mediastinal contours are within normal limits. Both lungs are clear. The visualized skeletal structures are unremarkable.  IMPRESSION: No active cardiopulmonary disease.   Electronically Signed   By: Franchot Gallo M.D.   On: 02/01/2015 11:21    ED Course  Procedures   MDM  24 y.o. female with productive cough, congestion and sore throat x 3 days with fever. Will treat for bronchitis. Stable for discharge without headache or meningeal signs. I have reviewed this patient's vital signs, nurses notes, appropriate labs and imaging.  Discussed with the patient and all questioned fully answered. She will return if any problems arise.  Final diagnoses:  [place], NP 02/01/15 [place], MD 02/02/15 [phone]

## 2015-02-01 NOTE — Care Management Note (Signed)
ED/CM noted patient did not have health insurance and/or PCP listed in the computer.  Patient was given the [facility] with information on the clinics, food pantries, and the handout for new health insurance sign-up. Pt was also given a Rx discount card. Patient expressed appreciation for information received.

## 2015-02-01 NOTE — ED Notes (Signed)
Requesting crackers, peanut butter and a sprite to go.

## 2015-02-03 LAB — CULTURE, GROUP A STREP

## 2015-04-03 ENCOUNTER — Emergency Department (HOSPITAL_COMMUNITY)
Admission: EM | Admit: 2015-04-03 | Discharge: 2015-04-03 | Discharge status: Against Medical Advice | Payer: Self-pay | Attending: Emergency Medicine | Admitting: Emergency Medicine

## 2015-04-03 ENCOUNTER — Encounter (HOSPITAL_COMMUNITY): Payer: Self-pay | Admitting: Emergency Medicine

## 2015-04-03 DIAGNOSIS — J029 Acute pharyngitis, unspecified: Secondary | ICD-10-CM | POA: Insufficient documentation

## 2015-04-03 DIAGNOSIS — G8929 Other chronic pain: Secondary | ICD-10-CM | POA: Insufficient documentation

## 2015-04-03 DIAGNOSIS — G43011 Migraine without aura, intractable, with status migrainosus: Secondary | ICD-10-CM | POA: Insufficient documentation

## 2015-04-03 DIAGNOSIS — M791 Myalgia: Secondary | ICD-10-CM | POA: Insufficient documentation

## 2015-04-03 DIAGNOSIS — J45909 Unspecified asthma, uncomplicated: Secondary | ICD-10-CM | POA: Insufficient documentation

## 2015-04-03 DIAGNOSIS — G43911 Migraine, unspecified, intractable, with status migrainosus: Secondary | ICD-10-CM

## 2015-04-03 LAB — RAPID STREP SCREEN (MED CTR MEBANE ONLY): STREPTOCOCCUS, GROUP A SCREEN (DIRECT): NEGATIVE

## 2015-04-03 MED ORDER — SODIUM CHLORIDE 0.9 % IV BOLUS (SEPSIS): 1000.0000 mL | Freq: Once | INTRAVENOUS | Status: DC

## 2015-04-03 MED ORDER — DEXAMETHASONE SODIUM PHOSPHATE 4 MG/ML IJ SOLN: 10.0000 mg | Freq: Once | INTRAMUSCULAR | Status: DC

## 2015-04-03 MED ORDER — DIPHENHYDRAMINE HCL 50 MG/ML IJ SOLN: 25.0000 mg | Freq: Once | INTRAMUSCULAR | Status: DC

## 2015-04-03 MED ORDER — PROMETHAZINE HCL 25 MG/ML IJ SOLN: 12.5000 mg | Freq: Once | INTRAMUSCULAR | Status: DC

## 2015-04-03 MED ORDER — SODIUM CHLORIDE 0.9 % IV SOLN: INTRAVENOUS | Status: DC

## 2015-04-03 NOTE — ED Notes (Signed)
Still unable to locate patient. Dr. Rogene Houston informed.

## 2015-04-03 NOTE — ED Provider Notes (Signed)
CSN: [id number]     Arrival date & time 04/03/15  1613 History   First MD Initiated Contact with Patient 04/03/15 1716     Chief Complaint  Patient presents with  . Sore Throat  . Headache     (Consider location/radiation/quality/duration/timing/severity/associated sxs/prior Treatment) Patient is a 24 y.o. female presenting with pharyngitis and headaches. The history is provided by the patient.  Sore Throat Associated symptoms include headaches. Pertinent negatives include no abdominal pain.  Headache Associated symptoms: myalgias, photophobia and sore throat   Associated symptoms: no abdominal pain, no congestion, no diarrhea, no fever, no nausea and no vomiting    patient would complain of headache 1 week. Started on left side typical for her migraines. Then progressed all over, worse in last couple days. Patient with onset of sore throat today and hoarseness to her throat. Patient states that she's had strep pharyngitis in the past. Patient states that she has some bodyaches no fever.  No vomiting no nausea.  Past Medical History  Diagnosis Date  . Asthma   . Chronic headaches    Past Surgical History  Procedure Laterality Date  . Eye surgery     Family History  Problem Relation Age of Onset  . Diabetes Other    History  Substance Use Topics  . Smoking status: Never Smoker   . Smokeless tobacco: Never Used  . Alcohol Use: No   OB History    Gravida Para Term Preterm AB TAB SAB Ectopic Multiple Living   1 1 1       1      Review of Systems  Constitutional: Negative for fever.  HENT: Positive for sore throat and voice change. Negative for congestion.   Eyes: Positive for photophobia.  Gastrointestinal: Negative for nausea, vomiting, abdominal pain and diarrhea.  Genitourinary: Negative for dysuria.  Musculoskeletal: Positive for myalgias.  Skin: Negative for rash.  Neurological: Positive for headaches.  Hematological: Does not bruise/bleed easily.   Psychiatric/Behavioral: Negative for confusion.      Allergies  Tomato  Home Medications   Prior to Admission medications   Medication Sig Start Date End Date Taking? Authorizing Provider  azithromycin (ZITHROMAX Z-PAK) 250 MG tablet Take 2 tablets today and then one tablet PO daily Patient not taking: Reported on 04/03/2015 02/01/15   Jaime Murrain, NP  HYDROcodone-acetaminophen (NORCO/VICODIN) 5-325 MG per tablet Take 1 tablet by mouth every 4 (four) hours as needed. Patient not taking: Reported on 04/03/2015 02/01/15   Tylesha Murrain, NP  pseudoephedrine (SUDAFED) 30 MG tablet Take 1 tablet (30 mg total) by mouth every 6 (six) hours as needed for congestion. Patient not taking: Reported on 04/03/2015 02/01/15   Jessice Murrain, NP   BP 117/80 mmHg  Pulse 89  Temp(Src) 99.4 F (37.4 C) (Oral)  Resp 18  Ht 5' (1.524 m)  Wt 160 lb (72.576 kg)  BMI 31.25 kg/m2  SpO2 100%  LMP 03/06/2015 Physical Exam  Constitutional: She is oriented to person, place, and time. She appears well-developed and well-nourished. No distress.  HENT:  Head: Normocephalic and atraumatic.  Mouth/Throat: Oropharynx is clear and moist. No oropharyngeal exudate.  No tonsillar exudate  Eyes: Conjunctivae and EOM are normal. Pupils are equal, round, and reactive to light.  Neck: Normal range of motion.  Cardiovascular: Normal rate, regular rhythm and normal heart sounds.   No murmur heard. Pulmonary/Chest: Effort normal. No respiratory distress.  Abdominal: Soft. Bowel sounds are normal. There is no tenderness.  Musculoskeletal:  Normal range of motion.  Neurological: She is alert and oriented to person, place, and time. No cranial nerve deficit. She exhibits normal muscle tone. Coordination normal.  Skin: Skin is warm. No rash noted.  Nursing note and vitals reviewed.   ED Course  Procedures (including critical care time) Labs Review Labs Reviewed  RAPID STREP SCREEN  CULTURE, GROUP A STREP  BASIC  METABOLIC PANEL  CBC WITH DIFFERENTIAL/PLATELET  URINALYSIS, ROUTINE W REFLEX MICROSCOPIC  PREGNANCY, URINE   Results for orders placed or performed during the hospital encounter of 04/03/15  Rapid strep screen  Result Value Ref Range   Streptococcus, Group A Screen (Direct) NEGATIVE NEGATIVE     Imaging Review No results found.   EKG Interpretation None      MDM   Final diagnoses:  Sore throat  Intractable migraine with status migrainosus, unspecified migraine type    Patient provides a history of migraines that typically start on the left side. This seems to be typical for one of her migraines. Does seem as if the headache intensity is worse than usual. Also with new onset of sore throat suggestive of a developing upper respiratory infection or either strep pharyngitis. Rapid strep was ordered. Results not back. Patient was to be treated with migraine cocktail Decadron Phenergan and Benadryl and was also substituted some Toradol. Patient was concerned about having a urinary tract infection she was asymptomatic but ordered urine and basic labs and a urine pregnancy test for her. Patient stated that earlier in the week she was seen at [facility] and was sent home with pain medication to take at home Questioning why she wasn't going to be sent home with any pain medication here and I told her that we treat migraines with IV fluids in the migraine cocktail that should eradicate the headache over the next 24 hours and if not she would need to return but most of the time it is successful. Told patient that she would be treated with an antibiotic if the strep test was positive and that would be important that she get treated.  Patient ended up leaving AMA without telling anybody. Raising suspicion for perhaps drug-seeking behavior.  Patient's rapid strep was negative. Will be follow-up by a throat culture.  Fredia Sorrow, MD 04/03/15 [phone]

## 2015-04-03 NOTE — ED Notes (Signed)
In room to start IV and gave medications. Unable to locate patient, patient seen leaving by myself and registration.

## 2015-04-03 NOTE — ED Notes (Signed)
Pt reports headache x 2 weeks that is worsening, pt also reports sore throat x 2 days. Pt reports hx of migraines.

## 2015-04-11 LAB — CULTURE, GROUP A STREP: STREP A CULTURE: NEGATIVE

## 2015-07-04 ENCOUNTER — Encounter (HOSPITAL_COMMUNITY): Payer: Self-pay | Admitting: *Deleted

## 2015-07-04 ENCOUNTER — Emergency Department (HOSPITAL_COMMUNITY)
Admission: EM | Admit: 2015-07-04 | Discharge: 2015-07-04 | Disposition: A | Discharge status: Home / Self Care | Payer: Self-pay | Attending: Emergency Medicine | Admitting: Emergency Medicine

## 2015-07-04 ENCOUNTER — Emergency Department (HOSPITAL_COMMUNITY): Payer: Self-pay

## 2015-07-04 DIAGNOSIS — M25519 Pain in unspecified shoulder: Secondary | ICD-10-CM | POA: Insufficient documentation

## 2015-07-04 DIAGNOSIS — R300 Dysuria: Secondary | ICD-10-CM | POA: Insufficient documentation

## 2015-07-04 DIAGNOSIS — A5901 Trichomonal vulvovaginitis: Secondary | ICD-10-CM | POA: Insufficient documentation

## 2015-07-04 DIAGNOSIS — Z79899 Other long term (current) drug therapy: Secondary | ICD-10-CM | POA: Insufficient documentation

## 2015-07-04 DIAGNOSIS — R0789 Other chest pain: Secondary | ICD-10-CM | POA: Insufficient documentation

## 2015-07-04 DIAGNOSIS — J45909 Unspecified asthma, uncomplicated: Secondary | ICD-10-CM | POA: Insufficient documentation

## 2015-07-04 DIAGNOSIS — G8929 Other chronic pain: Secondary | ICD-10-CM | POA: Insufficient documentation

## 2015-07-04 LAB — URINALYSIS, ROUTINE W REFLEX MICROSCOPIC
Bilirubin Urine: NEGATIVE
Glucose, UA: NEGATIVE mg/dL
KETONES UR: NEGATIVE mg/dL
Nitrite: NEGATIVE
Protein, ur: NEGATIVE mg/dL
Specific Gravity, Urine: 1.025 (ref 1.005–1.030)
Urobilinogen, UA: 0.2 mg/dL (ref 0.0–1.0)
pH: 6 (ref 5.0–8.0)

## 2015-07-04 LAB — URINE MICROSCOPIC-ADD ON

## 2015-07-04 MED ORDER — METRONIDAZOLE 500 MG PO TABS: 500.0000 mg | ORAL_TABLET | Freq: Two times a day (BID) | ORAL | Status: DC

## 2015-07-04 MED ORDER — NAPROXEN 500 MG PO TABS: 500.0000 mg | ORAL_TABLET | Freq: Two times a day (BID) | ORAL | Status: DC

## 2015-07-04 MED ORDER — METRONIDAZOLE 500 MG PO TABS: 2000.0000 mg | ORAL_TABLET | Freq: Once | ORAL | Status: DC

## 2015-07-04 NOTE — ED Notes (Signed)
Patient c/o right sided chest pain since Thursday, worse with movement. VSS. Nad.

## 2015-07-04 NOTE — ED Notes (Signed)
Pt was given a prescription for flagyl but was the wrong dose.  Called patient and made her aware not to fill the prescriptions given and that I would call in the prescriptions she needed.  Called prescription for naprosyn and flagyl in to [facility] in [place] as requested by pt.  Flagyl was prescribed as 500mg   Take 4 tabs (2000mg ) by mouth once.

## 2015-07-04 NOTE — ED Provider Notes (Signed)
CSN: [id number]     Arrival date & time 07/04/15  1135 History   First MD Initiated Contact with Patient 07/04/15 1340     Chief Complaint  Patient presents with  . Chest Pain      HPI  Differential evaluation of chest pain. 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 Started this early last week. By Thursday was having some pain in her right anterior chest and right anterior shoulder. Hurts to move hurts to palpate. Also has somewhat of an afterthought states "can you check my urine". States she has some dysuria. Denies discharge.  Past Medical History  Diagnosis Date  . Asthma   . Chronic headaches    Past Surgical History  Procedure Laterality Date  . Eye surgery     Family History  Problem Relation Age of Onset  . Diabetes Other    History  Substance Use Topics  . Smoking status: Never Smoker   . Smokeless tobacco: Never Used  . Alcohol Use: No   OB History    Gravida Para Term Preterm AB TAB SAB Ectopic Multiple Living   1 1 1       1      Review of Systems  Constitutional: Negative for fever, chills, diaphoresis, appetite change and fatigue.  HENT: Negative for mouth sores, sore throat and trouble swallowing.   Eyes: Negative for visual disturbance.  Respiratory: Negative for cough, chest tightness, shortness of breath and wheezing.   Cardiovascular: Positive for chest pain.  Gastrointestinal: Negative for nausea, vomiting, abdominal pain, diarrhea and abdominal distention.  Endocrine: Negative for polydipsia, polyphagia and polyuria.  Genitourinary: Positive for dysuria. Negative for frequency and hematuria.  Musculoskeletal: Negative for gait problem.       Shoulder pain.  Skin: Negative for color change, pallor and rash.  Neurological: Negative for dizziness, syncope, light-headedness and headaches.  Hematological: Does not bruise/bleed easily.  Psychiatric/Behavioral:  Negative for behavioral problems and confusion.      Allergies  Tomato  Home Medications   Prior to Admission medications   Medication Sig Start Date End Date Taking? Authorizing Provider  albuterol (PROVENTIL HFA;VENTOLIN HFA) 108 (90 BASE) MCG/ACT inhaler Inhale 2 puffs into the lungs every 6 (six) hours as needed for wheezing or shortness of breath.   Yes Historical Provider, MD  azithromycin (ZITHROMAX Z-PAK) 250 MG tablet Take 2 tablets today and then one tablet PO daily Patient not taking: Reported on 04/03/2015 02/01/15   Karen Murrain, NP  HYDROcodone-acetaminophen (NORCO/VICODIN) 5-325 MG per tablet Take 1 tablet by mouth every 4 (four) hours as needed. Patient not taking: Reported on 04/03/2015 02/01/15   Meelah Murrain, NP  metroNIDAZOLE (FLAGYL) 500 MG tablet Take 1 tablet (500 mg total) by mouth 2 (two) times daily. 07/04/15   Tanna Furry, MD  naproxen (NAPROSYN) 500 MG tablet Take 1 tablet (500 mg total) by mouth 2 (two) times daily. 07/04/15   Tanna Furry, MD  pseudoephedrine (SUDAFED) 30 MG tablet Take 1 tablet (30 mg total) by mouth every 6 (six) hours as needed for congestion. Patient not taking: Reported on 04/03/2015 02/01/15   Galen Murrain, NP   BP 110/76 mmHg  Pulse 74  Temp(Src) 98 F (36.7 C) (Oral)  Resp 14  Ht 5\' 2"  (1.575 m)  Wt 170 lb (77.111 kg)  BMI 31.09 kg/m2  SpO2 97%  LMP 06/27/2015 Physical Exam  Constitutional: She  is oriented to person, place, and time. She appears well-developed and well-nourished. No distress.  HENT:  Head: Normocephalic.  Eyes: Conjunctivae are normal. Pupils are equal, round, and reactive to light. No scleral icterus.  Neck: Normal range of motion. Neck supple. No thyromegaly present.  Cardiovascular: Normal rate and regular rhythm.  Exam reveals no gallop and no friction rub.   No murmur heard. Pulmonary/Chest: Effort normal and breath sounds normal. No respiratory distress. She has no wheezes. She has no rales.  Abdominal: Soft.  Bowel sounds are normal. She exhibits no distension. There is no tenderness. There is no rebound.  Musculoskeletal: Normal range of motion.       Arms: Pain reproduced with pectoralis active or passive motion.  Neurological: She is alert and oriented to person, place, and time.  Skin: Skin is warm and dry. No rash noted.  Psychiatric: She has a normal mood and affect. Her behavior is normal.    ED Course  Procedures (including critical care time) Labs Review Labs Reviewed  URINALYSIS, [place] (NOT AT [facility]) - Abnormal; Notable for the following:    Hgb urine dipstick SMALL (*)    Leukocytes, UA SMALL (*)    All other components within normal limits  URINE MICROSCOPIC-ADD ON - Abnormal; Notable for the following:    Squamous Epithelial / LPF MANY (*)    Bacteria, UA FEW (*)    All other components within normal limits    Imaging Review Dg Chest 2 View  07/04/2015   CLINICAL DATA:  Right side chest pain for 4 days.  EXAM: CHEST  2 VIEW  COMPARISON:  02/01/2015  FINDINGS: The heart size and mediastinal contours are within normal limits. Both lungs are clear. The visualized skeletal structures are unremarkable.  IMPRESSION: No active cardiopulmonary disease.   Electronically Signed   By: Rolm Baptise M.D.   On: 07/04/2015 12:09     EKG Interpretation   Date/Time:  Monday July 04 2015 11:38:00 EDT Ventricular Rate:  81 PR Interval:  168 QRS Duration: 80 QT Interval:  334 QTC Calculation: 387 R Axis:   43 Text Interpretation:  Sinus rhythm with marked sinus arrhythmia  Nonspecific T wave abnormality Abnormal ECG No previous ECGs available  Confirmed by ZACKOWSKI  MD, SCOTT [phone]) on 07/04/2015 11:43:53 AM Also  confirmed by Jeneen Rinks  MD, [place] ([id number])  on 07/04/2015 2:01:55 PM      MDM   Final diagnoses:  Trichomonal vaginitis  Chest wall pain    Patient has clearly chest wall related pain. It is reproduced with pectoralis activity and stretching. Has  trichomonas on urinalysis. Will treat with Flagyl. And inflammatory. Some stretching was demonstrated. Likely related to her new job and repetitive motion.    Tanna Furry, MD 07/04/15 [phone]

## 2015-07-04 NOTE — Discharge Instructions (Signed)
Chest Wall Pain Chest wall pain is pain felt in or around the chest bones and muscles. It may take up to 6 weeks to get better. It may take longer if you are active. Chest wall pain can happen on its own. Other times, things like germs, injury, coughing, or exercise can cause the pain. HOME CARE   Avoid activities that make you tired or cause pain. Try not to use your chest, belly (abdominal), or side muscles. Do not use heavy weights.  Put ice on the sore area.  Put ice in a plastic bag.  Place a towel between your skin and the bag.  Leave the ice on for 15-20 minutes for the first 2 days.  Only take medicine as told by your doctor. GET HELP RIGHT AWAY IF:   You have more pain or are very uncomfortable.  You have a fever.  Your chest pain gets worse.  You have new problems.  You feel sick to your stomach (nauseous) or throw up (vomit).  You start to sweat or feel lightheaded.  You have a cough with mucus (phlegm).  You cough up blood. MAKE SURE YOU:   Understand these instructions.  Will watch your condition.  Will get help right away if you are not doing well or get worse. Document Released: 05/21/2008 Document Revised: 02/25/2012 Document Reviewed: 07/30/2011 [facility] Patient Information 2015 [place], [place]. This information is not intended to replace advice given to you by your health care provider. Make sure you discuss any questions you have with your health care provider.  Trichomoniasis Trichomoniasis is an infection caused by an organism called Trichomonas. The infection can affect both women and men. In women, the outer female genitalia and the vagina are affected. In men, the penis is mainly affected, but the prostate and other reproductive organs can also be involved. Trichomoniasis is a sexually transmitted infection (STI) and is most often passed to another person through sexual contact.  RISK FACTORS  Having unprotected sexual intercourse.  Having  sexual intercourse with an infected partner. SIGNS AND SYMPTOMS  Symptoms of trichomoniasis in women include:  Abnormal gray-green frothy vaginal discharge.  Itching and irritation of the vagina.  Itching and irritation of the area outside the vagina. Symptoms of trichomoniasis in men include:   Penile discharge with or without pain.  Pain during urination. This results from inflammation of the urethra. DIAGNOSIS  Trichomoniasis may be found during a Pap test or physical exam. Your health care provider may use one of the following methods to help diagnose this infection:  Examining vaginal discharge under a microscope. For men, urethral discharge would be examined.  Testing the pH of the vagina with a test tape.  Using a vaginal swab test that checks for the Trichomonas organism. A test is available that provides results within a few minutes.  Doing a culture test for the organism. This is not usually needed. TREATMENT   You may be given medicine to fight the infection. Women should inform their health care provider if they could be or are pregnant. Some medicines used to treat the infection should not be taken during pregnancy.  Your health care provider may recommend over-the-counter medicines or creams to decrease itching or irritation.  Your sexual partner will need to be treated if infected. HOME CARE INSTRUCTIONS   Take medicines only as directed by your health care provider.  Take over-the-counter medicine for itching or irritation as directed by your health care provider.  Do not have sexual  intercourse while you have the infection.  Women should not douche or wear tampons while they have the infection.  Discuss your infection with your partner. Your partner may have gotten the infection from you, or you may have gotten it from your partner.  Have your sex partner get examined and treated if necessary.  Practice safe, informed, and protected sex.  See your  health care provider for other STI testing. SEEK MEDICAL CARE IF:   You still have symptoms after you finish your medicine.  You develop abdominal pain.  You have pain when you urinate.  You have bleeding after sexual intercourse.  You develop a rash.  Your medicine makes you sick or makes you throw up (vomit). MAKE SURE YOU:  Understand these instructions.  Will watch your condition.  Will get help right away if you are not doing well or get worse. Document Released: 05/29/2001 Document Revised: 04/19/2014 Document Reviewed: 09/14/2013 [facility] Patient Information 2015 [place], [place]. This information is not intended to replace advice given to you by your health care provider. Make sure you discuss any questions you have with your health care provider.

## 2015-12-07 ENCOUNTER — Emergency Department (HOSPITAL_COMMUNITY)
Admission: EM | Admit: 2015-12-07 | Discharge: 2015-12-07 | Disposition: A | Discharge status: Home / Self Care | Payer: Self-pay | Attending: Emergency Medicine | Admitting: Emergency Medicine

## 2015-12-07 ENCOUNTER — Emergency Department (HOSPITAL_COMMUNITY): Payer: Self-pay

## 2015-12-07 ENCOUNTER — Encounter (HOSPITAL_COMMUNITY): Payer: Self-pay

## 2015-12-07 DIAGNOSIS — J45909 Unspecified asthma, uncomplicated: Secondary | ICD-10-CM | POA: Insufficient documentation

## 2015-12-07 DIAGNOSIS — Z3202 Encounter for pregnancy test, result negative: Secondary | ICD-10-CM | POA: Insufficient documentation

## 2015-12-07 DIAGNOSIS — Z79899 Other long term (current) drug therapy: Secondary | ICD-10-CM | POA: Insufficient documentation

## 2015-12-07 DIAGNOSIS — M791 Myalgia: Secondary | ICD-10-CM | POA: Insufficient documentation

## 2015-12-07 DIAGNOSIS — Z791 Long term (current) use of non-steroidal anti-inflammatories (NSAID): Secondary | ICD-10-CM | POA: Insufficient documentation

## 2015-12-07 DIAGNOSIS — G8929 Other chronic pain: Secondary | ICD-10-CM | POA: Insufficient documentation

## 2015-12-07 DIAGNOSIS — J069 Acute upper respiratory infection, unspecified: Secondary | ICD-10-CM | POA: Insufficient documentation

## 2015-12-07 DIAGNOSIS — Z792 Long term (current) use of antibiotics: Secondary | ICD-10-CM | POA: Insufficient documentation

## 2015-12-07 LAB — POC URINE PREG, ED: PREG TEST UR: NEGATIVE

## 2015-12-07 MED ORDER — HYDROCOD POLST-CPM POLST ER 10-8 MG/5ML PO SUER
5.0000 mL | Freq: Once | ORAL | Status: AC
  Administered 2015-12-07: 5 mL via ORAL
  Filled 2015-12-07: qty 5

## 2015-12-07 MED ORDER — PREDNISOLONE 15 MG/5ML PO SOLN: 60.0000 mg | Freq: Every day | ORAL | Status: AC

## 2015-12-07 MED ORDER — IBUPROFEN 400 MG PO TABS
400.0000 mg | ORAL_TABLET | Freq: Once | ORAL | Status: AC
  Administered 2015-12-07: 400 mg via ORAL
  Filled 2015-12-07: qty 1

## 2015-12-07 MED ORDER — HYDROCOD POLST-CPM POLST ER 10-8 MG/5ML PO SUER: 5.0000 mL | Freq: Two times a day (BID) | ORAL | Status: DC | PRN

## 2015-12-07 MED ORDER — ALBUTEROL SULFATE HFA 108 (90 BASE) MCG/ACT IN AERS
2.0000 | INHALATION_SPRAY | Freq: Once | RESPIRATORY_TRACT | Status: AC
  Administered 2015-12-07: 2 via RESPIRATORY_TRACT
  Filled 2015-12-07: qty 6.7

## 2015-12-07 NOTE — ED Notes (Signed)
Pt made aware of need for urine sample. Gave sprite to drink.

## 2015-12-07 NOTE — Discharge Instructions (Signed)
Please use Tylenol every 4 hours, or ibuprofen every 6 hours for fever and aching. Please use 2 puffs of albuterol every 4 hours. Prelone daily until all taken. Tussionex for cough and congestion every 12 hours. This medication may cause drowsiness, please use with caution. Please wash hands frequently. Please increase fluids. Upper Respiratory Infection, Adult Most upper respiratory infections (URIs) are caused by a virus. A URI affects the nose, throat, and upper air passages. The most common type of URI is often called "the common cold." HOME CARE   Take medicines only as told by your doctor.  Gargle warm saltwater or take cough drops to comfort your throat as told by your doctor.  Use a warm mist humidifier or inhale steam from a shower to increase air moisture. This may make it easier to breathe.  Drink enough fluid to keep your pee (urine) clear or pale yellow.  Eat soups and other clear broths.  Have a healthy diet.  Rest as needed.  Go back to work when your fever is gone or your doctor says it is okay.  You may need to stay home longer to avoid giving your URI to others.  You can also wear a face mask and wash your hands often to prevent spread of the virus.  Use your inhaler more if you have asthma.  Do not use any tobacco products, including cigarettes, chewing tobacco, or electronic cigarettes. If you need help quitting, ask your doctor. GET HELP IF:  You are getting worse, not better.  Your symptoms are not helped by medicine.  You have chills.  You are getting more short of breath.  You have brown or red mucus.  You have yellow or brown discharge from your nose.  You have pain in your face, especially when you bend forward.  You have a fever.  You have puffy (swollen) neck glands.  You have pain while swallowing.  You have white areas in the back of your throat. GET HELP RIGHT AWAY IF:   You have very bad or constant:  Headache.  Ear  pain.  Pain in your forehead, behind your eyes, and over your cheekbones (sinus pain).  Chest pain.  You have long-lasting (chronic) lung disease and any of the following:  Wheezing.  Long-lasting cough.  Coughing up blood.  A change in your usual mucus.  You have a stiff neck.  You have changes in your:  Vision.  Hearing.  Thinking.  Mood. MAKE SURE YOU:   Understand these instructions.  Will watch your condition.  Will get help right away if you are not doing well or get worse.   This information is not intended to replace advice given to you by your health care provider. Make sure you discuss any questions you have with your health care provider.   Document Released: 05/21/2008 Document Revised: 04/19/2015 Document Reviewed: 03/10/2014 Elsevier Interactive Patient Education [employer].

## 2015-12-07 NOTE — ED Notes (Signed)
Pt given sprite and crackers  

## 2015-12-07 NOTE — ED Notes (Signed)
Pt reports cough, sore throat, headache, and body aches x  1 week.

## 2015-12-07 NOTE — ED Provider Notes (Signed)
CSN: [id number]     Arrival date & time 12/07/15  1310 History   First MD Initiated Contact with Patient 12/07/15 1503     Chief Complaint  Patient presents with  . Fever     (Consider location/radiation/quality/duration/timing/severity/associated sxs/prior Treatment) Patient is a 24 y.o. female presenting with URI. The history is provided by the patient.  URI Presenting symptoms: congestion, cough and sore throat   Severity:  Moderate Onset quality:  Gradual Duration:  1 week Timing:  Intermittent Progression:  Worsening Chronicity:  New Relieved by:  Nothing Associated symptoms: headaches, myalgias and sneezing   Risk factors: sick contacts   Risk factors: no chronic kidney disease, no diabetes mellitus, no immunosuppression and no recent travel     Past Medical History  Diagnosis Date  . Asthma   . Chronic headaches    Past Surgical History  Procedure Laterality Date  . Eye surgery     Family History  Problem Relation Age of Onset  . Diabetes Other    Social History  Substance Use Topics  . Smoking status: Never Smoker   . Smokeless tobacco: Never Used  . Alcohol Use: No   OB History    Gravida Para Term Preterm AB TAB SAB Ectopic Multiple Living   1 1 1       1      Review of Systems  HENT: Positive for congestion, sneezing and sore throat.   Respiratory: Positive for cough.   Musculoskeletal: Positive for myalgias.  Neurological: Positive for headaches.  All other systems reviewed and are negative.     Allergies  Tomato  Home Medications   Prior to Admission medications   Medication Sig Start Date End Date Taking? Authorizing Provider  albuterol (PROVENTIL HFA;VENTOLIN HFA) 108 (90 BASE) MCG/ACT inhaler Inhale 2 puffs into the lungs every 6 (six) hours as needed for wheezing or shortness of breath.    Historical Provider, MD  azithromycin (ZITHROMAX Z-PAK) 250 MG tablet Take 2 tablets today and then one tablet PO daily Patient not taking:  Reported on 04/03/2015 02/01/15   Sheril Murrain, NP  HYDROcodone-acetaminophen (NORCO/VICODIN) 5-325 MG per tablet Take 1 tablet by mouth every 4 (four) hours as needed. Patient not taking: Reported on 04/03/2015 02/01/15   Trey Murrain, NP  metroNIDAZOLE (FLAGYL) 500 MG tablet Take 1 tablet (500 mg total) by mouth 2 (two) times daily. 07/04/15   Tanna Furry, MD  metroNIDAZOLE (FLAGYL) 500 MG tablet Take 4 tablets (2,000 mg total) by mouth once. 07/04/15   Tanna Furry, MD  naproxen (NAPROSYN) 500 MG tablet Take 1 tablet (500 mg total) by mouth 2 (two) times daily. 07/04/15   Tanna Furry, MD  naproxen (NAPROSYN) 500 MG tablet Take 1 tablet (500 mg total) by mouth 2 (two) times daily with a meal. 07/04/15   Tanna Furry, MD  pseudoephedrine (SUDAFED) 30 MG tablet Take 1 tablet (30 mg total) by mouth every 6 (six) hours as needed for congestion. Patient not taking: Reported on 04/03/2015 02/01/15   Artemisa Murrain, NP   BP 136/76 mmHg  Pulse 103  Temp(Src) 99.4 F (37.4 C) (Oral)  Resp 20  Ht 5' (1.524 m)  Wt 63.504 kg  BMI 27.34 kg/m2  SpO2 99%  LMP 11/14/2015 Physical Exam  Constitutional: She is oriented to person, place, and time. She appears well-developed and well-nourished.  Non-toxic appearance.  HENT:  Head: Normocephalic.  Right Ear: Tympanic membrane and external ear normal.  Left Ear: Tympanic membrane  and external ear normal.  Nasal congestion present.  Eyes: EOM and lids are normal. Pupils are equal, round, and reactive to light.  Neck: Normal range of motion. Neck supple. Carotid bruit is not present.  Cardiovascular: Normal rate, regular rhythm, normal heart sounds, intact distal pulses and normal pulses.   Pulmonary/Chest: Breath sounds normal. No respiratory distress.  Abdominal: Soft. Bowel sounds are normal. There is no tenderness. There is no guarding.  Musculoskeletal: Normal range of motion.  Lymphadenopathy:       Head (right side): No submandibular adenopathy present.       Head  (left side): No submandibular adenopathy present.    She has no cervical adenopathy.  Neurological: She is alert and oriented to person, place, and time. She has normal strength. No cranial nerve deficit or sensory deficit.  Skin: Skin is warm and dry. No rash noted.  Psychiatric: She has a normal mood and affect. Her speech is normal.  Nursing note and vitals reviewed.   ED Course  Procedures (including critical care time) Labs Review Labs Reviewed  POC URINE PREG, ED    Imaging Review Dg Chest 2 View  12/07/2015  CLINICAL DATA:  Cough and sore throat for 1 week EXAM: CHEST - 2 VIEW COMPARISON:  07/04/2015 FINDINGS: The heart size and mediastinal contours are within normal limits. Both lungs are clear. The visualized skeletal structures are unremarkable. IMPRESSION: No active disease. Electronically Signed   By: Inez Catalina M.D.   On: 12/07/2015 15:55   I have personally reviewed and evaluated these images and lab results as part of my medical decision-making.   EKG Interpretation None      MDM  Vital signs reviewed. Chest x-ray is negative for any acute disease or problem. Oxygen level is 99%, well within normal limits. Examination favors upper respiratory infection. Discussed with the patient the importance of increasing fluids, wash hands frequently, using a mask until symptoms have resolved. Patient treated with cystocele and requests. Prelone also ordered. Patient to follow-up with primary physician or return to the emergency department if any changes or problems.    Final diagnoses:  None    **I have reviewed nursing notes, vital signs, and all appropriate lab and imaging results for this patient.Lily Kocher, PA-C 12/12/15 [place], MD 12/19/15 [phone]

## 2015-12-07 NOTE — ED Notes (Signed)
POC urine pregnancy negative. 

## 2015-12-13 ENCOUNTER — Emergency Department (HOSPITAL_COMMUNITY)
Admission: EM | Admit: 2015-12-13 | Discharge: 2015-12-13 | Disposition: A | Discharge status: Home / Self Care | Payer: Self-pay | Attending: Emergency Medicine | Admitting: Emergency Medicine

## 2015-12-13 ENCOUNTER — Encounter (HOSPITAL_COMMUNITY): Payer: Self-pay | Admitting: Cardiology

## 2015-12-13 DIAGNOSIS — M791 Myalgia: Secondary | ICD-10-CM | POA: Insufficient documentation

## 2015-12-13 DIAGNOSIS — J039 Acute tonsillitis, unspecified: Secondary | ICD-10-CM | POA: Insufficient documentation

## 2015-12-13 DIAGNOSIS — Z79899 Other long term (current) drug therapy: Secondary | ICD-10-CM | POA: Insufficient documentation

## 2015-12-13 DIAGNOSIS — J45998 Other asthma: Secondary | ICD-10-CM | POA: Insufficient documentation

## 2015-12-13 DIAGNOSIS — Z792 Long term (current) use of antibiotics: Secondary | ICD-10-CM | POA: Insufficient documentation

## 2015-12-13 DIAGNOSIS — G8929 Other chronic pain: Secondary | ICD-10-CM | POA: Insufficient documentation

## 2015-12-13 DIAGNOSIS — Z791 Long term (current) use of non-steroidal anti-inflammatories (NSAID): Secondary | ICD-10-CM | POA: Insufficient documentation

## 2015-12-13 MED ORDER — AZITHROMYCIN 250 MG PO TABS: 250.0000 mg | ORAL_TABLET | Freq: Every day | ORAL | Status: DC

## 2015-12-13 NOTE — ED Provider Notes (Signed)
CSN: [id number]     Arrival date & time 12/13/15  [id number] History   First MD Initiated Contact with Patient 12/13/15 [phone]     Chief Complaint  Patient presents with  . Cough     (Consider location/radiation/quality/duration/timing/severity/associated sxs/prior Treatment) HPI   Barbara Lam is a 24 y.o. female who presents to the Emergency Department complaining of persistent cough, sore throat , swollen tonsils and nasal congestion.  She states she was seen here last week for same and states she has been taking her medications, but does not feel any better.  She reports continued cough that is occasionally productive, and worsening pain with swallowing.  She was given an inhaler, tussionex and prelone syrup.  She denies vomiting, fever, shortness of breath, neck pain, abdominal or chest pain.     Past Medical History  Diagnosis Date  . Asthma   . Chronic headaches    Past Surgical History  Procedure Laterality Date  . Eye surgery     Family History  Problem Relation Age of Onset  . Diabetes Other    Social History  Substance Use Topics  . Smoking status: Never Smoker   . Smokeless tobacco: Never Used  . Alcohol Use: No   OB History    Gravida Para Term Preterm AB TAB SAB Ectopic Multiple Living   1 1 1       1      Review of Systems  Constitutional: Negative for fever, chills, activity change and appetite change.  HENT: Positive for congestion, rhinorrhea and sore throat. Negative for facial swelling and trouble swallowing.   Eyes: Negative for visual disturbance.  Respiratory: Positive for cough. Negative for chest tightness, shortness of breath, wheezing and stridor.   Gastrointestinal: Negative for nausea and vomiting.  Musculoskeletal: Positive for myalgias. Negative for neck pain and neck stiffness.  Skin: Negative.  Negative for rash.  Neurological: Negative for dizziness, weakness, numbness and headaches.  Hematological: Negative for adenopathy.   Psychiatric/Behavioral: Negative for confusion.  All other systems reviewed and are negative.     Allergies  Tomato  Home Medications   Prior to Admission medications   Medication Sig Start Date End Date Taking? Authorizing Provider  albuterol (PROVENTIL HFA;VENTOLIN HFA) 108 (90 BASE) MCG/ACT inhaler Inhale 2 puffs into the lungs every 6 (six) hours as needed for wheezing or shortness of breath.    Historical Provider, MD  azithromycin (ZITHROMAX Z-PAK) 250 MG tablet Take 2 tablets today and then one tablet PO daily Patient not taking: Reported on 04/03/2015 02/01/15   Lashaundra Murrain, NP  chlorpheniramine-HYDROcodone [facility] PENNKINETIC ER) 10-8 MG/5ML SUER Take 5 mLs by mouth every 12 (twelve) hours as needed for cough. 12/07/15   Lily Kocher, PA-C  HYDROcodone-acetaminophen (NORCO/VICODIN) 5-325 MG per tablet Take 1 tablet by mouth every 4 (four) hours as needed. Patient not taking: Reported on 04/03/2015 02/01/15   Prestina Murrain, NP  metroNIDAZOLE (FLAGYL) 500 MG tablet Take 1 tablet (500 mg total) by mouth 2 (two) times daily. 07/04/15   Tanna Furry, MD  metroNIDAZOLE (FLAGYL) 500 MG tablet Take 4 tablets (2,000 mg total) by mouth once. 07/04/15   Tanna Furry, MD  naproxen (NAPROSYN) 500 MG tablet Take 1 tablet (500 mg total) by mouth 2 (two) times daily. 07/04/15   Tanna Furry, MD  naproxen (NAPROSYN) 500 MG tablet Take 1 tablet (500 mg total) by mouth 2 (two) times daily with a meal. 07/04/15   Tanna Furry, MD  pseudoephedrine (SUDAFED)  30 MG tablet Take 1 tablet (30 mg total) by mouth every 6 (six) hours as needed for congestion. Patient not taking: Reported on 04/03/2015 02/01/15   Cassandre Murrain, NP   BP 118/82 mmHg  Pulse 80  Temp(Src) 98.3 F (36.8 C) (Oral)  Resp 18  Ht 5' (1.524 m)  Wt 63.504 kg  BMI 27.34 kg/m2  SpO2 95%  LMP 11/14/2015 Physical Exam  Constitutional: She is oriented to person, place, and time. She appears well-developed and well-nourished. No distress.  HENT:   Head: Normocephalic and atraumatic.  Right Ear: Tympanic membrane and ear canal normal.  Left Ear: Tympanic membrane and ear canal normal.  Mouth/Throat: Uvula is midline and mucous membranes are normal. Posterior oropharyngeal edema and posterior oropharyngeal erythema present. No oropharyngeal exudate or tonsillar abscesses.  Eyes: EOM are normal. Pupils are equal, round, and reactive to light.  Neck: Normal range of motion, full passive range of motion without pain and phonation normal. Neck supple.  Cardiovascular: Normal rate, regular rhythm, normal heart sounds and intact distal pulses.   No murmur heard. Pulmonary/Chest: Effort normal. No stridor. No respiratory distress. She has no wheezes. She has no rales. She exhibits no tenderness.  Coarse lungs sounds bilaterally.  No rales or wheezing  Abdominal: Soft. She exhibits no distension. There is no tenderness.  Musculoskeletal: Normal range of motion. She exhibits no edema.  Lymphadenopathy:    She has no cervical adenopathy.  Neurological: She is alert and oriented to person, place, and time. She exhibits normal muscle tone. Coordination normal.  Skin: Skin is warm and dry.  Nursing note and vitals reviewed.   ED Course  Procedures (including critical care time) Labs Review Labs Reviewed - No data to display  Imaging Review No results found. I have personally reviewed and evaluated these images and lab results as part of my medical decision-making.   CXR from last visit was reviewed by me and considered in my MDM   MDM   Final diagnoses:  Tonsillitis    Pt seen here last week for same.  Returns today stating that she is no better.  Vitals stable, well appearing.  Airway is patent, no PTA.  Pt has one half of her prelone syrup still in the bottle.  When asked why she hasn't finished she stated that "the other medicine was so expensive, I could only afford half of the prelone."  Pt was advised to finish the medication as  directed and continue her inhaler as needed. She appears stable for d/c    Kem Parkinson, PA-C 12/13/15 0844  Ezequiel Essex, MD 12/13/15 [phone]

## 2015-12-13 NOTE — Discharge Instructions (Signed)
Tonsillitis Tonsillitis is an infection of the throat. This infection causes the tonsils to become red, tender, and puffy (swollen). Tonsils are groups of tissue at the back of your throat. If bacteria caused your infection, antibiotic medicine will be given to you. Sometimes symptoms of tonsillitis can be relieved with the use of steroid medicine. If your tonsillitis is severe and happens often, you may need to get your tonsils removed (tonsillectomy). HOME CARE   Rest and sleep often.  Drink enough fluids to keep your pee (urine) clear or pale yellow.  While your throat is sore, eat soft or liquid foods like:  Soup.  Ice cream.  Instant breakfast drinks.  Eat frozen ice pops.  Gargle with a warm or cold liquid to help soothe the throat. Gargle with a water and salt mix. Mix 1/4 teaspoon of salt and 1/4 teaspoon of baking soda in 1 cup of water.  Only take medicines as told by your doctor.  If you are given medicines (antibiotics), take them as told. Finish them even if you start to feel better. GET HELP IF:  You have large, tender lumps in your neck.  You have a rash.  You cough up green, yellow-brown, or bloody fluid.  You cannot swallow liquids or food for 24 hours.  You notice that only one of your tonsils is swollen. GET HELP RIGHT AWAY IF:   You throw up (vomit).  You have a very bad headache.  You have a stiff neck.  You have chest pain.  You have trouble breathing or swallowing.  You have bad throat pain, drooling, or your voice changes.  You have bad pain not helped by medicine.  You cannot fully open your mouth.  You have redness, puffiness, or bad pain in the neck.  You have a fever. MAKE SURE YOU:   Understand these instructions.  Will watch your condition.  Will get help right away if you are not doing well or get worse.   This information is not intended to replace advice given to you by your health care provider. Make sure you discuss any  questions you have with your health care provider.   Document Released: 05/21/2008 Document Revised: 12/08/2013 Document Reviewed: 05/22/2013 Elsevier Interactive Patient Education [employer].

## 2015-12-13 NOTE — ED Notes (Signed)
Pt waiting on ride

## 2015-12-13 NOTE — ED Notes (Signed)
Pt d/c papers and prescriptions given and reviewed. Pt. Verbalized understanding. No further questions.

## 2015-12-13 NOTE — ED Notes (Signed)
Seen here last week for a cough and swollen tonsils.  States she is no better.

## 2016-06-27 ENCOUNTER — Encounter: Payer: Self-pay | Admitting: Adult Health

## 2016-06-27 ENCOUNTER — Encounter: Payer: Self-pay | Admitting: *Deleted

## 2016-07-27 ENCOUNTER — Emergency Department (HOSPITAL_COMMUNITY)
Admission: EM | Admit: 2016-07-27 | Discharge: 2016-07-27 | Disposition: A | Discharge status: Home / Self Care | Payer: Self-pay | Attending: Emergency Medicine | Admitting: Emergency Medicine

## 2016-07-27 ENCOUNTER — Emergency Department (HOSPITAL_COMMUNITY): Payer: Self-pay

## 2016-07-27 ENCOUNTER — Encounter (HOSPITAL_COMMUNITY): Payer: Self-pay | Admitting: Emergency Medicine

## 2016-07-27 DIAGNOSIS — Z79899 Other long term (current) drug therapy: Secondary | ICD-10-CM | POA: Insufficient documentation

## 2016-07-27 DIAGNOSIS — J020 Streptococcal pharyngitis: Secondary | ICD-10-CM | POA: Insufficient documentation

## 2016-07-27 DIAGNOSIS — R059 Cough, unspecified: Secondary | ICD-10-CM

## 2016-07-27 DIAGNOSIS — R05 Cough: Secondary | ICD-10-CM

## 2016-07-27 DIAGNOSIS — J45909 Unspecified asthma, uncomplicated: Secondary | ICD-10-CM | POA: Insufficient documentation

## 2016-07-27 LAB — RAPID STREP SCREEN (MED CTR MEBANE ONLY): Streptococcus, Group A Screen (Direct): POSITIVE — AB

## 2016-07-27 MED ORDER — ALBUTEROL SULFATE (2.5 MG/3ML) 0.083% IN NEBU: 2.5000 mg | INHALATION_SOLUTION | RESPIRATORY_TRACT | 0 refills | Status: DC | PRN

## 2016-07-27 MED ORDER — AMOXICILLIN 500 MG PO CAPS: 500.0000 mg | ORAL_CAPSULE | Freq: Two times a day (BID) | ORAL | 0 refills | Status: AC

## 2016-07-27 MED ORDER — ALBUTEROL SULFATE HFA 108 (90 BASE) MCG/ACT IN AERS: 2.0000 | INHALATION_SPRAY | RESPIRATORY_TRACT | 0 refills | Status: DC | PRN

## 2016-07-27 MED ORDER — IPRATROPIUM-ALBUTEROL 0.5-2.5 (3) MG/3ML IN SOLN
3.0000 mL | Freq: Once | RESPIRATORY_TRACT | Status: AC
  Administered 2016-07-27: 3 mL via RESPIRATORY_TRACT
  Filled 2016-07-27: qty 3

## 2016-07-27 MED ORDER — ALBUTEROL SULFATE (2.5 MG/3ML) 0.083% IN NEBU
2.5000 mg | INHALATION_SOLUTION | Freq: Once | RESPIRATORY_TRACT | Status: AC
  Administered 2016-07-27: 2.5 mg via RESPIRATORY_TRACT
  Filled 2016-07-27: qty 3

## 2016-07-27 MED ORDER — AMOXICILLIN 250 MG PO CAPS
500.0000 mg | ORAL_CAPSULE | Freq: Once | ORAL | Status: AC
  Administered 2016-07-27: 500 mg via ORAL
  Filled 2016-07-27: qty 2

## 2016-07-27 NOTE — Discharge Instructions (Signed)
Take the prescriptions as directed.  Use your albuterol inhaler (2 to 4 puffs) or your albuterol nebulizer (1 unit dose) every 4 hours for the next 7 days, then as needed for cough, wheezing, or shortness of breath.  Call your regular medical doctor this morning to schedule a follow up appointment within the next 3 days.  Return to the Emergency Department immediately sooner if worsening.

## 2016-07-27 NOTE — ED Notes (Signed)
RT called for TX

## 2016-07-27 NOTE — ED Provider Notes (Signed)
[place] DEPT Provider Note   CSN: [id number] Arrival date & time: 07/27/16  0704  First Provider Contact:  None       History   Chief Complaint Chief Complaint  Patient presents with  . Cough    HPI Barbara Lam is a 25 y.o. female.  HPI  Pt was seen at 0730.   Per pt, c/o gradual onset and persistence of constant sore throat, runny/stuffy nose, sinus congestion, and cough for the past 1 week, worse over the past 3 days.  Denies fevers, no rash, no CP/SOB, no N/V/D, no abd pain.    Past Medical History:  Diagnosis Date  . Asthma   . Chronic headaches     There are no active problems to display for this patient.   Past Surgical History:  Procedure Laterality Date  . EYE SURGERY      OB History    Gravida Para Term Preterm AB Living   1 1 1     1    SAB TAB Ectopic Multiple Live Births                   Home Medications    Prior to Admission medications   Medication Sig Start Date End Date Taking? Authorizing Provider  albuterol (PROVENTIL HFA;VENTOLIN HFA) 108 (90 BASE) MCG/ACT inhaler Inhale 2 puffs into the lungs every 6 (six) hours as needed for wheezing or shortness of breath.    Historical Provider, MD  azithromycin (ZITHROMAX) 250 MG tablet Take 1 tablet (250 mg total) by mouth daily. Take first 2 tablets together, then 1 every day until finished. 12/13/15   Tammy Triplett, PA-C  chlorpheniramine-HYDROcodone (TUSSIONEX PENNKINETIC ER) 10-8 MG/5ML SUER Take 5 mLs by mouth every 12 (twelve) hours as needed for cough. 12/07/15   Lily Kocher, PA-C  metroNIDAZOLE (FLAGYL) 500 MG tablet Take 1 tablet (500 mg total) by mouth 2 (two) times daily. 07/04/15   Tanna Furry, MD  metroNIDAZOLE (FLAGYL) 500 MG tablet Take 4 tablets (2,000 mg total) by mouth once. 07/04/15   Tanna Furry, MD  naproxen (NAPROSYN) 500 MG tablet Take 1 tablet (500 mg total) by mouth 2 (two) times daily. 07/04/15   Tanna Furry, MD  naproxen (NAPROSYN) 500 MG tablet Take 1 tablet (500 mg  total) by mouth 2 (two) times daily with a meal. 07/04/15   Tanna Furry, MD    Family History Family History  Problem Relation Age of Onset  . Diabetes Other     Social History Social History  Substance Use Topics  . Smoking status: Never Smoker  . Smokeless tobacco: Never Used  . Alcohol use No     Allergies   Tomato   Review of Systems Review of Systems ROS: Statement: All systems negative except as marked or noted in the HPI; Constitutional: Negative for fever and chills. ; ; Eyes: Negative for eye pain, redness and discharge. ; ; ENMT: Negative for ear pain, hoarseness, +nasal congestion, sinus pressure and sore throat. ; ; Cardiovascular: Negative for chest pain, palpitations, diaphoresis, dyspnea and peripheral edema. ; ; Respiratory: +cough. Negative for wheezing and stridor. ; ; Gastrointestinal: Negative for nausea, vomiting, diarrhea, abdominal pain, blood in stool, hematemesis, jaundice and rectal bleeding. . ; ; Genitourinary: Negative for dysuria, flank pain and hematuria. ; ; Musculoskeletal: Negative for back pain and neck pain. Negative for swelling and trauma.; ; Skin: Negative for pruritus, rash, abrasions, blisters, bruising and skin lesion.; ; Neuro: Negative for headache, lightheadedness and  neck stiffness. Negative for weakness, altered level of consciousness, altered mental status, extremity weakness, paresthesias, involuntary movement, seizure and syncope.      Physical Exam Updated Vital Signs BP 130/91 (BP Location: Right Arm)   Pulse 77   Temp 97.8 F (36.6 C) (Oral)   Resp 16   Ht 5\' 2"  (1.575 m)   Wt 140 lb (63.5 kg)   LMP 06/30/2016   SpO2 100%   BMI 25.61 kg/m   Physical Exam 0735: Physical examination:  Nursing notes reviewed; Vital signs and O2 SAT reviewed;  Constitutional: Well developed, Well nourished, Well hydrated, In no acute distress; Head:  Normocephalic, atraumatic; Eyes: EOMI, PERRL, No scleral icterus; ENMT: TM's clear bilat.  +edemetous nasal turbinates bilat with clear rhinorrhea. Mouth and pharynx without lesions. No tonsillar exudates. No intra-oral edema. No submandibular or sublingual edema. No hoarse voice, no drooling, no stridor. No pain with manipulation of larynx. No trismus.  Mouth and pharynx normal, Mucous membranes moist; Neck: Supple, Full range of motion, No lymphadenopathy; Cardiovascular: Regular rate and rhythm, No gallop; Respiratory: Breath sounds coarse & equal bilaterally, scattered exp wheezes. +dry cough. No audible wheezing. Speaking full sentences with ease, Normal respiratory effort/excursion; Chest: Nontender, Movement normal; Abdomen: Soft, Nontender, Nondistended, Normal bowel sounds; Genitourinary: No CVA tenderness; Extremities: Pulses normal, No tenderness, No edema, No calf edema or asymmetry.; Neuro: AA&Ox3, Major CN grossly intact.  Speech clear. No gross focal motor or sensory deficits in extremities.; Skin: Color normal, Warm, Dry.   ED Treatments / Results  Labs (all labs ordered are listed, but only abnormal results are displayed)   EKG  EKG Interpretation None       Radiology   Procedures Procedures (including critical care time)  Medications Ordered in ED Medications  ipratropium-albuterol (DUONEB) 0.5-2.5 (3) MG/3ML nebulizer solution 3 mL (not administered)  albuterol (PROVENTIL) (2.5 MG/3ML) 0.083% nebulizer solution 2.5 mg (not administered)     Initial Impression / Assessment and Plan / ED Course  I have reviewed the triage vital signs and the nursing notes.  Pertinent labs & imaging results that were available during my care of the patient were reviewed by me and considered in my medical decision making (see chart for details).  MDM Reviewed: previous chart, nursing note and vitals Interpretation: labs and x-ray    Results for orders placed or performed during the hospital encounter of 07/27/16  Rapid strep screen  Result Value Ref Range    Streptococcus, Group A Screen (Direct) POSITIVE (A) NEGATIVE   Dg Chest 2 View Result Date: 07/27/2016 CLINICAL DATA:  25 year old female with cough, congestion, chills and chest pain EXAM: CHEST  2 VIEW COMPARISON:  Prior chest x-ray 12/07/2015 FINDINGS: The lungs are clear and negative for focal airspace consolidation, pulmonary edema or suspicious pulmonary nodule. No pleural effusion or pneumothorax. Cardiac and mediastinal contours are within normal limits. No acute fracture or lytic or blastic osseous lesions. The visualized upper abdominal bowel gas pattern is unremarkable. IMPRESSION: Normal chest x-ray. Electronically Signed   By: Jacqulynn Cadet M.D.   On: 07/27/2016 07:54    0840:  Neb given for cough with improvement; lungs now CTA bilat, no further cough, no wheezing. Tx strep pharyngitis. Pt requesting refill of her MDI and neb solution. Dx and testing d/w pt.  Questions answered.  Verb understanding, agreeable to d/c home with outpt f/u.    Final Clinical Impressions(s) / ED Diagnoses   Final diagnoses:  None    New Prescriptions New  Prescriptions   No medications on file     Francine Graven, DO 07/28/16 [phone]

## 2016-07-27 NOTE — ED Triage Notes (Signed)
Pt reports cough and congestion for 3 days. Has been coughing up yellow sputum. Has had chills, nasal drainage, sore throat.

## 2016-12-29 ENCOUNTER — Emergency Department (HOSPITAL_COMMUNITY): Payer: Self-pay

## 2016-12-29 ENCOUNTER — Encounter (HOSPITAL_COMMUNITY): Payer: Self-pay | Admitting: Emergency Medicine

## 2016-12-29 ENCOUNTER — Emergency Department (HOSPITAL_COMMUNITY)
Admission: EM | Admit: 2016-12-29 | Discharge: 2016-12-29 | Disposition: A | Discharge status: Home / Self Care | Payer: Self-pay | Attending: Emergency Medicine | Admitting: Emergency Medicine

## 2016-12-29 DIAGNOSIS — J209 Acute bronchitis, unspecified: Secondary | ICD-10-CM | POA: Insufficient documentation

## 2016-12-29 DIAGNOSIS — J45909 Unspecified asthma, uncomplicated: Secondary | ICD-10-CM | POA: Insufficient documentation

## 2016-12-29 DIAGNOSIS — Z79899 Other long term (current) drug therapy: Secondary | ICD-10-CM | POA: Insufficient documentation

## 2016-12-29 MED ORDER — PREDNISONE 20 MG PO TABS: ORAL_TABLET | ORAL | 0 refills | Status: DC

## 2016-12-29 MED ORDER — ALBUTEROL SULFATE HFA 108 (90 BASE) MCG/ACT IN AERS: 2.0000 | INHALATION_SPRAY | RESPIRATORY_TRACT | 0 refills | Status: DC | PRN

## 2016-12-29 MED ORDER — ALBUTEROL SULFATE (2.5 MG/3ML) 0.083% IN NEBU
5.0000 mg | INHALATION_SOLUTION | Freq: Once | RESPIRATORY_TRACT | Status: AC
  Administered 2016-12-29: 5 mg via RESPIRATORY_TRACT
  Filled 2016-12-29: qty 6

## 2016-12-29 MED ORDER — IPRATROPIUM BROMIDE 0.02 % IN SOLN
0.5000 mg | Freq: Once | RESPIRATORY_TRACT | Status: AC
  Administered 2016-12-29: 0.5 mg via RESPIRATORY_TRACT
  Filled 2016-12-29: qty 2.5

## 2016-12-29 MED ORDER — AZITHROMYCIN 250 MG PO TABS: ORAL_TABLET | ORAL | 0 refills | Status: DC

## 2016-12-29 NOTE — Discharge Instructions (Signed)
Use your inhaler as needed for wheezing or shortness of breath. Take the prednisone and antibiotic until gone. Recheck if you get a fever or get worse.

## 2016-12-29 NOTE — ED Provider Notes (Signed)
[facility] DEPT Provider Note   CSN: [id number] Arrival date & time: 12/29/16  0441  Time seen 05:20AM   History   Chief Complaint Chief Complaint  Patient presents with  . Influenza    HPI Barbara Lam is a 26 y.o. female.  HPI patient states she started feeling bad on January 8. She states she woke up with a headache. She's been having diarrhea about 3 times a day since. She has nausea off and on but no vomiting. She has some lower abdominal discomfort. She's also had a dry cough all week without sore throat. She's had clear rhinorrhea that sometimes yellow. She's had wheezing off and on and states her inhaler helps when she takes it. She denies any fever. She states tonight she woke up in her chest felt tight and she felt like she was wheezing so she came to the ED without using her inhaler.  PCP [facility] Department   Past Medical History:  Diagnosis Date  . Asthma   . Chronic headaches     There are no active problems to display for this patient.   Past Surgical History:  Procedure Laterality Date  . EYE SURGERY      OB History    Gravida Para Term Preterm AB Living   1 1 1     1    SAB TAB Ectopic Multiple Live Births                   Home Medications    Prior to Admission medications   Medication Sig Start Date End Date Taking? Authorizing Provider  albuterol (PROVENTIL HFA;VENTOLIN HFA) 108 (90 Base) MCG/ACT inhaler Inhale 2 puffs into the lungs every 4 (four) hours as needed for wheezing or shortness of breath. 12/29/16   Rolland Porter, MD  azithromycin (ZITHROMAX) 250 MG tablet Take 2 po the first day then once a day for the next 4 days. 12/29/16   Rolland Porter, MD  chlorpheniramine-HYDROcodone (TUSSIONEX PENNKINETIC ER) 10-8 MG/5ML SUER Take 5 mLs by mouth every 12 (twelve) hours as needed for cough. 12/07/15   Lily Kocher, PA-C  metroNIDAZOLE (FLAGYL) 500 MG tablet Take 1 tablet (500 mg total) by mouth 2 (two) times daily. 07/04/15   Tanna Furry, MD  metroNIDAZOLE (FLAGYL) 500 MG tablet Take 4 tablets (2,000 mg total) by mouth once. 07/04/15   Tanna Furry, MD  naproxen (NAPROSYN) 500 MG tablet Take 1 tablet (500 mg total) by mouth 2 (two) times daily. 07/04/15   Tanna Furry, MD  naproxen (NAPROSYN) 500 MG tablet Take 1 tablet (500 mg total) by mouth 2 (two) times daily with a meal. 07/04/15   Tanna Furry, MD  predniSONE (DELTASONE) 20 MG tablet Take 3 po QD x 3d , then 2 po QD x 3d then 1 po QD x 3d 12/29/16   Rolland Porter, MD    Family History Family History  Problem Relation Age of Onset  . Diabetes Other     Social History Social History  Substance Use Topics  . Smoking status: Never Smoker  . Smokeless tobacco: Never Used  . Alcohol use Yes     Comment: occ  employed Smokes black and milds   Allergies   Tomato   Review of Systems Review of Systems  All other systems reviewed and are negative.    Physical Exam Updated Vital Signs BP 139/95 (BP Location: Right Arm)   Pulse 99   Temp 98.6 F (37 C) (Oral)  Resp 18   Ht 5\' 2"  (1.575 m)   Wt 130 lb (59 kg)   LMP 12/08/2016 (Approximate)   SpO2 99%   BMI 23.78 kg/m   Vital signs normal    Physical Exam  Constitutional: She is oriented to person, place, and time. She appears well-developed and well-nourished.  Non-toxic appearance. She does not appear ill. No distress.  HENT:  Head: Normocephalic and atraumatic.  Right Ear: External ear normal.  Left Ear: External ear normal.  Nose: Nose normal. No mucosal edema or rhinorrhea.  Mouth/Throat: Oropharynx is clear and moist and mucous membranes are normal. No dental abscesses or uvula swelling.  Eyes: Conjunctivae and EOM are normal. Pupils are equal, round, and reactive to light.  Neck: Normal range of motion and full passive range of motion without pain. Neck supple.  Cardiovascular: Normal rate, regular rhythm and normal heart sounds.  Exam reveals no gallop and no friction rub.   No murmur  heard. Pulmonary/Chest: Effort normal. No respiratory distress. She has decreased breath sounds. She has no wheezes. She has no rhonchi. She has no rales. She exhibits no tenderness and no crepitus.  Abdominal: Soft. Normal appearance and bowel sounds are normal. She exhibits no distension. There is no tenderness. There is no rebound and no guarding.  Musculoskeletal: Normal range of motion. She exhibits no edema or tenderness.  Moves all extremities well.   Neurological: She is alert and oriented to person, place, and time. She has normal strength. No cranial nerve deficit.  Skin: Skin is warm, dry and intact. No rash noted. No erythema. No pallor.  Psychiatric: She has a normal mood and affect. Her speech is normal and behavior is normal. Her mood appears not anxious.  Nursing note and vitals reviewed.    ED Treatments / Results  Labs (all labs ordered are listed, but only abnormal results are displayed) Labs Reviewed - No data to display  EKG  EKG Interpretation None       Radiology Dg Chest 2 View  Result Date: 12/29/2016 CLINICAL DATA:  Headache, cough, body aches, chills, and shortness of breath for 2 days. EXAM: CHEST  2 VIEW COMPARISON:  07/27/2016 FINDINGS: The heart size and mediastinal contours are within normal limits. Both lungs are clear. The visualized skeletal structures are unremarkable. IMPRESSION: No active cardiopulmonary disease. Electronically Signed   By: Lucienne Capers M.D.   On: 12/29/2016 07:06    Procedures Procedures (including critical care time)  Medications Ordered in ED Medications  albuterol (PROVENTIL) (2.5 MG/3ML) 0.083% nebulizer solution 5 mg (5 mg Nebulization Given 12/29/16 0559)  ipratropium (ATROVENT) nebulizer solution 0.5 mg (0.5 mg Nebulization Given 12/29/16 0559)     Initial Impression / Assessment and Plan / ED Course  I have reviewed the triage vital signs and the nursing notes.  Pertinent labs & imaging results that were  available during my care of the patient were reviewed by me and considered in my medical decision making (see chart for details).  Clinical Course    Patient was given albuterol/Atrovent nebulizer.  Patient was rechecked at 7:05 AM she states she's feeling much better. On exam she has improved air movement without wheezing or rhonchi. She was discharged home with steroids and antibiotic. She was given another prescription for an inhaler.  Final Clinical Impressions(s) / ED Diagnoses   Final diagnoses:  Bronchitis with bronchospasm    New Prescriptions New Prescriptions   ALBUTEROL (PROVENTIL HFA;VENTOLIN HFA) 108 (90 BASE) MCG/ACT INHALER  Inhale 2 puffs into the lungs every 4 (four) hours as needed for wheezing or shortness of breath.   AZITHROMYCIN (ZITHROMAX) 250 MG TABLET    Take 2 po the first day then once a day for the next 4 days.   PREDNISONE (DELTASONE) 20 MG TABLET    Take 3 po QD x 3d , then 2 po QD x 3d then 1 po QD x 3d    Plan discharge  Rolland Porter, MD, Barbette Or, MD 12/29/16 [phone]

## 2016-12-29 NOTE — ED Triage Notes (Signed)
PT states that she has been having headache, cough, body aches, chills, and SOB for 2 days

## 2016-12-29 NOTE — Progress Notes (Signed)
Patient did not take all of treatment. Took off before complete.

## 2017-09-16 ENCOUNTER — Encounter (HOSPITAL_COMMUNITY): Payer: Self-pay | Admitting: Emergency Medicine

## 2017-09-16 ENCOUNTER — Emergency Department (HOSPITAL_COMMUNITY)
Admission: EM | Admit: 2017-09-16 | Discharge: 2017-09-16 | Disposition: A | Discharge status: Home / Self Care | Payer: Self-pay | Attending: Emergency Medicine | Admitting: Emergency Medicine

## 2017-09-16 DIAGNOSIS — J45909 Unspecified asthma, uncomplicated: Secondary | ICD-10-CM | POA: Insufficient documentation

## 2017-09-16 DIAGNOSIS — K2900 Acute gastritis without bleeding: Secondary | ICD-10-CM | POA: Insufficient documentation

## 2017-09-16 DIAGNOSIS — R1013 Epigastric pain: Secondary | ICD-10-CM

## 2017-09-16 DIAGNOSIS — Z79899 Other long term (current) drug therapy: Secondary | ICD-10-CM | POA: Insufficient documentation

## 2017-09-16 LAB — HEPATIC FUNCTION PANEL
ALBUMIN: 4.1 g/dL (ref 3.5–5.0)
ALK PHOS: 83 U/L (ref 38–126)
ALT: 12 U/L — ABNORMAL LOW (ref 14–54)
AST: 15 U/L (ref 15–41)
Bilirubin, Direct: <0.1 mg/dL — ABNORMAL LOW (ref 0.1–0.5)
TOTAL PROTEIN: 8 g/dL (ref 6.5–8.1)
Total Bilirubin: 0.4 mg/dL (ref 0.3–1.2)

## 2017-09-16 LAB — LIPASE, BLOOD: LIPASE: 37 U/L (ref 11–51)

## 2017-09-16 LAB — CBC WITH DIFFERENTIAL/PLATELET
Basophils Absolute: 0 10*3/uL (ref 0.0–0.1)
Basophils Relative: 0 %
Eosinophils Absolute: 0 10*3/uL (ref 0.0–0.7)
Eosinophils Relative: 1 %
HEMATOCRIT: 39 % (ref 36.0–46.0)
HEMOGLOBIN: 13 g/dL (ref 12.0–15.0)
LYMPHS ABS: 2.1 10*3/uL (ref 0.7–4.0)
LYMPHS PCT: 31 %
MCH: 31.8 pg (ref 26.0–34.0)
MCHC: 33.3 g/dL (ref 30.0–36.0)
MCV: 95.4 fL (ref 78.0–100.0)
MONOS PCT: 4 %
Monocytes Absolute: 0.3 10*3/uL (ref 0.1–1.0)
NEUTROS ABS: 4.3 10*3/uL (ref 1.7–7.7)
NEUTROS PCT: 64 %
Platelets: 221 10*3/uL (ref 150–400)
RBC: 4.09 MIL/uL (ref 3.87–5.11)
RDW: 13.4 % (ref 11.5–15.5)
WBC: 6.8 10*3/uL (ref 4.0–10.5)

## 2017-09-16 LAB — URINALYSIS, ROUTINE W REFLEX MICROSCOPIC
Bilirubin Urine: NEGATIVE
Glucose, UA: NEGATIVE mg/dL
Hgb urine dipstick: NEGATIVE
Ketones, ur: NEGATIVE mg/dL
LEUKOCYTES UA: NEGATIVE
Nitrite: NEGATIVE
Protein, ur: NEGATIVE mg/dL
Specific Gravity, Urine: 1.021 (ref 1.005–1.030)
pH: 7 (ref 5.0–8.0)

## 2017-09-16 LAB — BASIC METABOLIC PANEL
Anion gap: 6 (ref 5–15)
BUN: 13 mg/dL (ref 6–20)
CHLORIDE: 106 mmol/L (ref 101–111)
CO2: 28 mmol/L (ref 22–32)
Calcium: 9.4 mg/dL (ref 8.9–10.3)
Creatinine, Ser: 0.8 mg/dL (ref 0.44–1.00)
GFR calc Af Amer: >60 mL/min (ref >60)
GFR calc non Af Amer: >60 mL/min (ref >60)
Glucose, Bld: 114 mg/dL — ABNORMAL HIGH (ref 65–99)
Potassium: 3.7 mmol/L (ref 3.5–5.1)
Sodium: 140 mmol/L (ref 135–145)

## 2017-09-16 LAB — PREGNANCY, URINE: Preg Test, Ur: NEGATIVE

## 2017-09-16 MED ORDER — GI COCKTAIL ~~LOC~~
30.0000 mL | Freq: Once | ORAL | Status: AC
  Administered 2017-09-16: 30 mL via ORAL
  Filled 2017-09-16: qty 30

## 2017-09-16 MED ORDER — RANITIDINE HCL 150 MG PO TABS: 150.0000 mg | ORAL_TABLET | Freq: Two times a day (BID) | ORAL | 0 refills | Status: DC

## 2017-09-16 NOTE — ED Provider Notes (Signed)
[facility] DEPT Provider Note   CSN: [id number] Arrival date & time: 09/16/17  1746     History   Chief Complaint Chief Complaint  Patient presents with  . Abdominal Pain    HPI CAROLLYNN Lam is a 26 y.o. female.  The history is provided by the patient.  Abdominal Pain   This is a new problem. Episode onset: 3-4 days ago. The problem occurs constantly. The problem has not changed since onset.The pain is associated with suspicious food intake (after eating [employer]). The pain is located in the epigastric region. The quality of the pain is aching and burning. The pain is at a severity of 4/10. The pain is moderate. Associated symptoms include nausea. Pertinent negatives include anorexia, fever, diarrhea, vomiting, constipation, dysuria, arthralgias and myalgias. Exacerbated by: a little worse with eating but hurts anyways and worse in the morning. Nothing relieves the symptoms. Past medical history comments: no prior hx.    Past Medical History:  Diagnosis Date  . Asthma   . Chronic headaches     There are no active problems to display for this patient.   Past Surgical History:  Procedure Laterality Date  . EYE SURGERY      OB History    Gravida Para Term Preterm AB Living   1 1 1     1    SAB TAB Ectopic Multiple Live Births                   Home Medications    Prior to Admission medications   Medication Sig Start Date End Date Taking? Authorizing Provider  albuterol (PROVENTIL HFA;VENTOLIN HFA) 108 (90 Base) MCG/ACT inhaler Inhale 2 puffs into the lungs every 4 (four) hours as needed for wheezing or shortness of breath. 12/29/16   Rolland Porter, MD  azithromycin (ZITHROMAX) 250 MG tablet Take 2 po the first day then once a day for the next 4 days. 12/29/16   Rolland Porter, MD  chlorpheniramine-HYDROcodone (TUSSIONEX PENNKINETIC ER) 10-8 MG/5ML SUER Take 5 mLs by mouth every 12 (twelve) hours as needed for cough. 12/07/15   Lily Kocher, PA-C  metroNIDAZOLE  (FLAGYL) 500 MG tablet Take 1 tablet (500 mg total) by mouth 2 (two) times daily. 07/04/15   Tanna Furry, MD  metroNIDAZOLE (FLAGYL) 500 MG tablet Take 4 tablets (2,000 mg total) by mouth once. 07/04/15   Tanna Furry, MD  naproxen (NAPROSYN) 500 MG tablet Take 1 tablet (500 mg total) by mouth 2 (two) times daily. 07/04/15   Tanna Furry, MD  naproxen (NAPROSYN) 500 MG tablet Take 1 tablet (500 mg total) by mouth 2 (two) times daily with a meal. 07/04/15   Tanna Furry, MD  predniSONE (DELTASONE) 20 MG tablet Take 3 po QD x 3d , then 2 po QD x 3d then 1 po QD x 3d 12/29/16   Rolland Porter, MD  ranitidine (ZANTAC) 150 MG tablet Take 1 tablet (150 mg total) by mouth 2 (two) times daily. 09/16/17   Blanchie Dessert, MD    Family History Family History  Problem Relation Age of Onset  . Diabetes Other     Social History Social History  Substance Use Topics  . Smoking status: Never Smoker  . Smokeless tobacco: Never Used  . Alcohol use Yes     Comment: occ     Allergies   Tomato   Review of Systems Review of Systems  Constitutional: Negative for fever.  Gastrointestinal: Positive for abdominal pain and nausea. Negative  for anorexia, constipation, diarrhea and vomiting.  Genitourinary: Negative for dysuria.  Musculoskeletal: Negative for arthralgias and myalgias.  All other systems reviewed and are negative.    Physical Exam Updated Vital Signs BP 124/84 (BP Location: Left Arm)   Pulse 87   Temp 99 F (37.2 C) (Oral)   Resp 16   Ht 5\' 2"  (1.575 m)   Wt 61.2 kg (135 lb)   LMP 09/09/2017   SpO2 100%   BMI 24.69 kg/m   Physical Exam  Constitutional: She is oriented to person, place, and time. She appears well-developed and well-nourished. No distress.  HENT:  Head: Normocephalic and atraumatic.  Mouth/Throat: Oropharynx is clear and moist.  Eyes: Pupils are equal, round, and reactive to light. Conjunctivae and EOM are normal.  Neck: Normal range of motion. Neck supple.    Cardiovascular: Normal rate, regular rhythm and intact distal pulses.   No murmur heard. Pulmonary/Chest: Effort normal and breath sounds normal. No respiratory distress. She has no wheezes. She has no rales.  Abdominal: Soft. She exhibits no distension. There is tenderness in the epigastric area. There is no rebound and no guarding.  Musculoskeletal: Normal range of motion. She exhibits no edema or tenderness.  Neurological: She is alert and oriented to person, place, and time.  Skin: Skin is warm and dry. No rash noted. No erythema.  Psychiatric: She has a normal mood and affect. Her behavior is normal.  Nursing note and vitals reviewed.    ED Treatments / Results  Labs (all labs ordered are listed, but only abnormal results are displayed) Labs Reviewed  BASIC METABOLIC PANEL - Abnormal; Notable for the following:       Result Value   Glucose, Bld 114 (*)    All other components within normal limits  URINALYSIS, ROUTINE W REFLEX MICROSCOPIC - Abnormal; Notable for the following:    APPearance HAZY (*)    All other components within normal limits  HEPATIC FUNCTION PANEL - Abnormal; Notable for the following:    ALT 12 (*)    Bilirubin, Direct <0.1 (*)    All other components within normal limits  CBC WITH DIFFERENTIAL/PLATELET  PREGNANCY, URINE  LIPASE, BLOOD    EKG  EKG Interpretation None       Radiology No results found.  Procedures Procedures (including critical care time)  Medications Ordered in ED Medications  gi cocktail (Maalox,Lidocaine,Donnatal) (30 mLs Oral Given 09/16/17 2249)     Initial Impression / Assessment and Plan / ED Course  I have reviewed the triage vital signs and the nursing notes.  Pertinent labs & imaging results that were available during my care of the patient were reviewed by me and considered in my medical decision making (see chart for details).     Patient is a healthy 26 y/o female presenting today with the complaint of  epigastric pain that started several days ago after eating [name]. It does seem to be worse when she wakes up in the morning and gets worse after eating. She is not take excessive NSAIDs nd does not drink excessively. Known medical problems. On exam patient has epigastric tenderness but no rebound or guarding. Labs are normal including white count, LFTs and lipase. UA without evidence of infection and urine pregnancy test is negative. Patient symptoms are improved with GI cocktail. Feel most likely this is gastritis. Discussed starting Zante next 2 weeks and change of diet  Final Clinical Impressions(s) / ED Diagnoses   Final diagnoses:  Epigastric  pain  Acute gastritis without hemorrhage, unspecified gastritis type    New Prescriptions New Prescriptions   RANITIDINE (ZANTAC) 150 MG TABLET    Take 1 tablet (150 mg total) by mouth 2 (two) times daily.     Blanchie Dessert, MD 09/16/17 2329

## 2017-09-16 NOTE — ED Notes (Signed)
Called no answer

## 2017-09-16 NOTE — ED Triage Notes (Signed)
Pt c/o mid abd pain since eating [employer] 3 days ago. Has been eating swince. Denies n/v. Had some diarrhea but none today. Nad.

## 2018-03-21 ENCOUNTER — Emergency Department (HOSPITAL_COMMUNITY)
Admission: EM | Admit: 2018-03-21 | Discharge: 2018-03-22 | Disposition: A | Discharge status: Home / Self Care | Payer: Self-pay | Attending: Emergency Medicine | Admitting: Emergency Medicine

## 2018-03-21 ENCOUNTER — Other Ambulatory Visit: Payer: Self-pay

## 2018-03-21 ENCOUNTER — Encounter (HOSPITAL_COMMUNITY): Payer: Self-pay | Admitting: Emergency Medicine

## 2018-03-21 DIAGNOSIS — R1084 Generalized abdominal pain: Secondary | ICD-10-CM | POA: Insufficient documentation

## 2018-03-21 DIAGNOSIS — R630 Anorexia: Secondary | ICD-10-CM | POA: Insufficient documentation

## 2018-03-21 DIAGNOSIS — R112 Nausea with vomiting, unspecified: Secondary | ICD-10-CM | POA: Insufficient documentation

## 2018-03-21 DIAGNOSIS — Z79899 Other long term (current) drug therapy: Secondary | ICD-10-CM | POA: Insufficient documentation

## 2018-03-21 DIAGNOSIS — R197 Diarrhea, unspecified: Secondary | ICD-10-CM | POA: Insufficient documentation

## 2018-03-21 DIAGNOSIS — J45909 Unspecified asthma, uncomplicated: Secondary | ICD-10-CM | POA: Insufficient documentation

## 2018-03-21 LAB — COMPREHENSIVE METABOLIC PANEL
ALBUMIN: 3.8 g/dL (ref 3.5–5.0)
ALT: 9 U/L — ABNORMAL LOW (ref 14–54)
ANION GAP: 11 (ref 5–15)
AST: 14 U/L — AB (ref 15–41)
Alkaline Phosphatase: 74 U/L (ref 38–126)
BUN: 10 mg/dL (ref 6–20)
CO2: 24 mmol/L (ref 22–32)
Calcium: 9.2 mg/dL (ref 8.9–10.3)
Chloride: 103 mmol/L (ref 101–111)
Creatinine, Ser: 0.89 mg/dL (ref 0.44–1.00)
GFR calc Af Amer: >60 mL/min (ref >60)
GFR calc non Af Amer: >60 mL/min (ref >60)
GLUCOSE: 83 mg/dL (ref 65–99)
POTASSIUM: 3.5 mmol/L (ref 3.5–5.1)
SODIUM: 138 mmol/L (ref 135–145)
Total Bilirubin: 0.6 mg/dL (ref 0.3–1.2)
Total Protein: 7.8 g/dL (ref 6.5–8.1)

## 2018-03-21 LAB — I-STAT BETA HCG BLOOD, ED (MC, WL, AP ONLY)

## 2018-03-21 LAB — CBC
HEMATOCRIT: 38.3 % (ref 36.0–46.0)
HEMOGLOBIN: 12.8 g/dL (ref 12.0–15.0)
MCH: 31.4 pg (ref 26.0–34.0)
MCHC: 33.4 g/dL (ref 30.0–36.0)
MCV: 94.1 fL (ref 78.0–100.0)
Platelets: 199 10*3/uL (ref 150–400)
RBC: 4.07 MIL/uL (ref 3.87–5.11)
RDW: 12.9 % (ref 11.5–15.5)
WBC: 6.7 10*3/uL (ref 4.0–10.5)

## 2018-03-21 LAB — URINALYSIS, ROUTINE W REFLEX MICROSCOPIC
Bilirubin Urine: NEGATIVE
Glucose, UA: NEGATIVE mg/dL
Ketones, ur: 20 mg/dL — AB
Leukocytes, UA: NEGATIVE
Nitrite: NEGATIVE
PROTEIN: 100 mg/dL — AB
Specific Gravity, Urine: 1.029 (ref 1.005–1.030)
pH: 6 (ref 5.0–8.0)

## 2018-03-21 LAB — LIPASE, BLOOD: Lipase: 25 U/L (ref 11–51)

## 2018-03-21 MED ORDER — IOPAMIDOL (ISOVUE-300) INJECTION 61%
INTRAVENOUS | Status: AC
  Filled 2018-03-21: qty 100

## 2018-03-21 MED ORDER — ONDANSETRON 4 MG PO TBDP
4.0000 mg | ORAL_TABLET | Freq: Once | ORAL | Status: AC
  Administered 2018-03-21: 4 mg via ORAL
  Filled 2018-03-21: qty 1

## 2018-03-21 MED ORDER — GI COCKTAIL ~~LOC~~
30.0000 mL | Freq: Once | ORAL | Status: AC
  Administered 2018-03-21: 30 mL via ORAL
  Filled 2018-03-21: qty 30

## 2018-03-21 NOTE — ED Provider Notes (Signed)
[facility] EMERGENCY DEPARTMENT Provider Note   CSN: [id number] Arrival date & time: 03/21/18  1641     History   Chief Complaint Chief Complaint  Patient presents with  . Abdominal Pain    HPI Barbara Lam is a 27 y.o. female.  The history is provided by the patient. No language interpreter was used.  Abdominal Pain   This is a new problem. The current episode started more than 2 days ago. The problem occurs constantly. The problem has been gradually worsening. The pain is located in the generalized abdominal region. The pain is moderate. Associated symptoms include anorexia, diarrhea and vomiting. Nothing aggravates the symptoms. Nothing relieves the symptoms. Past workup does not include GI consult.  Pt complains of abdominal pain,  Multiple episodes of vomiting and diarrhea.    Past Medical History:  Diagnosis Date  . Asthma   . Chronic headaches     There are no active problems to display for this patient.   Past Surgical History:  Procedure Laterality Date  . EYE SURGERY       OB History    Gravida  1   Para  1   Term  1   Preterm      AB      Living  1     SAB      TAB      Ectopic      Multiple      Live Births               Home Medications    Prior to Admission medications   Medication Sig Start Date End Date Taking? Authorizing Provider  albuterol (PROVENTIL HFA;VENTOLIN HFA) 108 (90 Base) MCG/ACT inhaler Inhale 2 puffs into the lungs every 4 (four) hours as needed for wheezing or shortness of breath. 12/29/16   Rolland Porter, MD  azithromycin (ZITHROMAX) 250 MG tablet Take 2 po the first day then once a day for the next 4 days. 12/29/16   Rolland Porter, MD  chlorpheniramine-HYDROcodone (TUSSIONEX PENNKINETIC ER) 10-8 MG/5ML SUER Take 5 mLs by mouth every 12 (twelve) hours as needed for cough. 12/07/15   Lily Kocher, PA-C  metroNIDAZOLE (FLAGYL) 500 MG tablet Take 1 tablet (500 mg total) by mouth 2 (two) times daily.  07/04/15   Tanna Furry, MD  metroNIDAZOLE (FLAGYL) 500 MG tablet Take 4 tablets (2,000 mg total) by mouth once. 07/04/15   Tanna Furry, MD  naproxen (NAPROSYN) 500 MG tablet Take 1 tablet (500 mg total) by mouth 2 (two) times daily. 07/04/15   Tanna Furry, MD  naproxen (NAPROSYN) 500 MG tablet Take 1 tablet (500 mg total) by mouth 2 (two) times daily with a meal. 07/04/15   Tanna Furry, MD  predniSONE (DELTASONE) 20 MG tablet Take 3 po QD x 3d , then 2 po QD x 3d then 1 po QD x 3d 12/29/16   Rolland Porter, MD  ranitidine (ZANTAC) 150 MG tablet Take 1 tablet (150 mg total) by mouth 2 (two) times daily. 09/16/17   Blanchie Dessert, MD    Family History Family History  Problem Relation Age of Onset  . Diabetes Other     Social History Social History   Tobacco Use  . Smoking status: Never Smoker  . Smokeless tobacco: Never Used  Substance Use Topics  . Alcohol use: Yes    Comment: occ  . Drug use: No     Allergies   Tomato   Review  of Systems Review of Systems  Gastrointestinal: Positive for abdominal pain, anorexia, diarrhea and vomiting.  All other systems reviewed and are negative.    Physical Exam Updated Vital Signs BP 114/71   Pulse 88   Temp 98.6 F (37 C) (Oral)   Resp 17   Ht 5\' 2"  (1.575 m)   Wt 59 kg (130 lb)   LMP 03/18/2018   SpO2 98%   BMI 23.78 kg/m   Physical Exam  Constitutional: She is oriented to person, place, and time. She appears well-developed and well-nourished.  HENT:  Head: Normocephalic.  Eyes: EOM are normal.  Neck: Normal range of motion.  Cardiovascular: Normal rate.  Pulmonary/Chest: Effort normal.  Abdominal: Normal appearance. She exhibits no distension. There is generalized tenderness.  Musculoskeletal: Normal range of motion.  Neurological: She is alert and oriented to person, place, and time.  Skin: Skin is warm.  Psychiatric: She has a normal mood and affect.  Nursing note and vitals reviewed.    ED Treatments / Results    Labs (all labs ordered are listed, but only abnormal results are displayed) Labs Reviewed  COMPREHENSIVE METABOLIC PANEL - Abnormal; Notable for the following components:      Result Value   AST 14 (*)    ALT 9 (*)    All other components within normal limits  URINALYSIS, ROUTINE W REFLEX MICROSCOPIC - Abnormal; Notable for the following components:   Color, Urine AMBER (*)    APPearance CLOUDY (*)    Hgb urine dipstick LARGE (*)    Ketones, ur 20 (*)    Protein, ur 100 (*)    Bacteria, UA FEW (*)    Squamous Epithelial / LPF 0-5 (*)    All other components within normal limits  LIPASE, BLOOD  CBC  I-STAT BETA HCG BLOOD, ED ([facility], [facility], [facility] ONLY)    EKG None  Radiology No results found.  Procedures Procedures (including critical care time)  Medications Ordered in ED Medications  ondansetron (ZOFRAN-ODT) disintegrating tablet 4 mg (4 mg Oral Given 03/21/18 2031)  gi cocktail (Maalox,Lidocaine,Donnatal) (30 mLs Oral Given 03/21/18 2032)     Initial Impression / Assessment and Plan / ED Course  I have reviewed the triage vital signs and the nursing notes.  Pertinent labs & imaging results that were available during my care of the patient were reviewed by me and considered in my medical decision making (see chart for details).     Pt given gi cocktail, zofran,  No relief from medications,  Labs reviewed  Ct pending  Final Clinical Impressions(s) / ED Diagnoses   Final diagnoses:  None    ED Discharge Orders    None      Pt's care turned over to Dr. Roxanne Mins.  Ct pending    Sidney Ace 11/94/17 4081    Delora Fuel, MD 44/81/85 [phone]

## 2018-03-21 NOTE — ED Triage Notes (Signed)
Pt states she has been having abd pain, no appetite. Pt has been vomiting and having diarrhea as well.

## 2018-03-22 ENCOUNTER — Emergency Department (HOSPITAL_COMMUNITY): Payer: Self-pay

## 2018-03-22 MED ORDER — ONDANSETRON HCL 4 MG/2ML IJ SOLN
4.0000 mg | Freq: Once | INTRAMUSCULAR | Status: AC
  Administered 2018-03-22: 4 mg via INTRAVENOUS
  Filled 2018-03-22: qty 2

## 2018-03-22 MED ORDER — MORPHINE SULFATE (PF) 4 MG/ML IV SOLN
4.0000 mg | Freq: Once | INTRAVENOUS | Status: AC
  Administered 2018-03-22: 4 mg via INTRAVENOUS
  Filled 2018-03-22: qty 1

## 2018-03-22 MED ORDER — ONDANSETRON HCL 4 MG PO TABS: 4.0000 mg | ORAL_TABLET | Freq: Four times a day (QID) | ORAL | 0 refills | Status: DC | PRN

## 2018-03-22 MED ORDER — IOPAMIDOL (ISOVUE-300) INJECTION 61%
100.0000 mL | Freq: Once | INTRAVENOUS | Status: AC | PRN
  Administered 2018-03-22: 100 mL via INTRAVENOUS

## 2018-03-22 MED ORDER — OXYCODONE-ACETAMINOPHEN 5-325 MG PO TABS: 1.0000 | ORAL_TABLET | ORAL | 0 refills | Status: DC | PRN

## 2018-03-22 NOTE — Discharge Instructions (Signed)
Return if symptoms are getting worse, or if not improving over the next 24-36 hours.

## 2018-03-22 NOTE — ED Notes (Signed)
Patient transported to CT 

## 2018-03-22 NOTE — ED Notes (Signed)
Waiting for IV team to come get an IV started so patient can go to CT.

## 2018-06-23 ENCOUNTER — Other Ambulatory Visit (HOSPITAL_COMMUNITY): Payer: Self-pay | Admitting: Nurse Practitioner

## 2018-06-23 DIAGNOSIS — N921 Excessive and frequent menstruation with irregular cycle: Secondary | ICD-10-CM

## 2018-06-23 DIAGNOSIS — N946 Dysmenorrhea, unspecified: Secondary | ICD-10-CM

## 2018-06-26 ENCOUNTER — Ambulatory Visit (HOSPITAL_COMMUNITY)
Admission: RE | Admit: 2018-06-26 | Discharge: 2018-06-26 | Disposition: A | Discharge status: Home / Self Care | Payer: Self-pay | Source: Ambulatory Visit | Attending: Nurse Practitioner | Admitting: Nurse Practitioner

## 2018-06-26 DIAGNOSIS — N920 Excessive and frequent menstruation with regular cycle: Secondary | ICD-10-CM | POA: Insufficient documentation

## 2018-06-26 DIAGNOSIS — N921 Excessive and frequent menstruation with irregular cycle: Secondary | ICD-10-CM

## 2018-06-26 DIAGNOSIS — N946 Dysmenorrhea, unspecified: Secondary | ICD-10-CM | POA: Insufficient documentation

## 2018-07-06 ENCOUNTER — Emergency Department (HOSPITAL_COMMUNITY): Payer: Self-pay

## 2018-07-06 ENCOUNTER — Encounter (HOSPITAL_COMMUNITY): Payer: Self-pay | Admitting: Emergency Medicine

## 2018-07-06 ENCOUNTER — Emergency Department (HOSPITAL_COMMUNITY)
Admission: EM | Admit: 2018-07-06 | Discharge: 2018-07-06 | Disposition: A | Discharge status: Home / Self Care | Payer: Self-pay | Attending: Emergency Medicine | Admitting: Emergency Medicine

## 2018-07-06 ENCOUNTER — Other Ambulatory Visit: Payer: Self-pay

## 2018-07-06 DIAGNOSIS — R05 Cough: Secondary | ICD-10-CM | POA: Insufficient documentation

## 2018-07-06 DIAGNOSIS — J45909 Unspecified asthma, uncomplicated: Secondary | ICD-10-CM | POA: Insufficient documentation

## 2018-07-06 DIAGNOSIS — R112 Nausea with vomiting, unspecified: Secondary | ICD-10-CM | POA: Insufficient documentation

## 2018-07-06 LAB — PREGNANCY, URINE: PREG TEST UR: NEGATIVE

## 2018-07-06 MED ORDER — ONDANSETRON 8 MG PO TBDP: 8.0000 mg | ORAL_TABLET | Freq: Three times a day (TID) | ORAL | 0 refills | Status: DC | PRN

## 2018-07-06 MED ORDER — ONDANSETRON 8 MG PO TBDP
8.0000 mg | ORAL_TABLET | Freq: Once | ORAL | Status: AC
  Administered 2018-07-06: 8 mg via ORAL
  Filled 2018-07-06: qty 1

## 2018-07-06 NOTE — Discharge Instructions (Addendum)
Please take the medications prescribed for nausea and vomiting.  We recommend that you go on a clear liquid diet for the next 2 days, before advancing to normal foods.  Return to the ER if your symptoms get worse.

## 2018-07-06 NOTE — ED Triage Notes (Signed)
Patient c/o hematemesis with left lower abd pain that started upon waking this morning. Patient states clots in emesis. Per patient diarrhea but unsure of any blood in stool.

## 2018-07-06 NOTE — ED Provider Notes (Addendum)
[facility] EMERGENCY DEPARTMENT Provider Note   CSN: [id number] Arrival date & time: 07/06/18  1259     History   Chief Complaint Chief Complaint  Patient presents with  . Hematemesis    HPI Barbara Lam is a 27 y.o. female.  HPI 27 year old female comes in with chief complaint of abdominal discomfort, with vomiting and coughing up blood.  Patient reports that she was doing well yesterday.  She woke up in the middle the night with emesis, and since then she has had cough and she has been having mucus which has blood in it.  Patient continues to have dry heaving but no more emesis.  Patient does not think she is pregnant.  She did go out and drink alcohol last night, but she does not think she is hung over and does not recall eating anything that was suspicious.  No sick contacts.  Patient smokes cigar, she has a chronic cough. Pt has no hx of PE, DVT and denies any exogenous hormone (testosterone / estrogen) use, long distance travels or surgery in the past 6 weeks, active cancer, recent immobilization.   Past Medical History:  Diagnosis Date  . Asthma   . Chronic headaches     There are no active problems to display for this patient.   Past Surgical History:  Procedure Laterality Date  . EYE SURGERY       OB History    Gravida  1   Para  1   Term  1   Preterm      AB      Living  1     SAB      TAB      Ectopic      Multiple      Live Births               Home Medications    Prior to Admission medications   Medication Sig Start Date End Date Taking? Authorizing Provider  ondansetron (ZOFRAN ODT) 8 MG disintegrating tablet Take 1 tablet (8 mg total) by mouth every 8 (eight) hours as needed for nausea. 07/06/18   Varney Biles, MD  ondansetron (ZOFRAN) 4 MG tablet Take 1 tablet (4 mg total) by mouth every 6 (six) hours as needed for nausea or vomiting. 02/19/99   Delora Fuel, MD  oxyCODONE-acetaminophen (PERCOCET) 5-325 MG tablet Take 1  tablet by mouth every 4 (four) hours as needed for moderate pain. 08/19/80   Delora Fuel, MD    Family History Family History  Problem Relation Age of Onset  . Diabetes Other     Social History Social History   Tobacco Use  . Smoking status: Never Smoker  . Smokeless tobacco: Never Used  Substance Use Topics  . Alcohol use: Yes    Comment: occ  . Drug use: No     Allergies   Tomato   Review of Systems Review of Systems  Constitutional: Positive for activity change.  Respiratory: Positive for cough. Negative for shortness of breath.   Cardiovascular: Negative for chest pain.  Gastrointestinal: Positive for abdominal pain and nausea.  Allergic/Immunologic: Negative for immunocompromised state.  Hematological: Does not bruise/bleed easily.  All other systems reviewed and are negative.    Physical Exam Updated Vital Signs BP 125/89   Pulse 69   Temp 98.8 F (37.1 C) (Oral)   Resp 18   Ht 5\' 2"  (1.575 m)   Wt 61.2 kg (135 lb)   LMP 06/29/2018  SpO2 97%   BMI 24.69 kg/m   Physical Exam  Constitutional: She is oriented to person, place, and time. She appears well-developed.  HENT:  Head: Normocephalic and atraumatic.  Eyes: EOM are normal.  Neck: Normal range of motion. Neck supple.  Cardiovascular: Normal rate.  Pulmonary/Chest: Effort normal. She has no wheezes.  Abdominal: Soft. Bowel sounds are normal. There is tenderness. There is no rebound and no guarding.  Neurological: She is alert and oriented to person, place, and time.  Skin: Skin is warm and dry.  Nursing note and vitals reviewed.    ED Treatments / Results  Labs (all labs ordered are listed, but only abnormal results are displayed) Labs Reviewed  PREGNANCY, URINE    EKG None  Radiology Dg Chest 2 View  Result Date: 07/06/2018 CLINICAL DATA:  Hematemesis, left abdominal pain EXAM: CHEST - 2 VIEW COMPARISON:  12/29/2016 FINDINGS: The heart size and mediastinal contours are within  normal limits. Both lungs are clear. The visualized skeletal structures are unremarkable. IMPRESSION: No active cardiopulmonary disease. Electronically Signed   By: Jerilynn Mages.  Shick M.D.   On: 07/06/2018 14:50    Procedures Procedures (including critical care time)  Medications Ordered in ED Medications  ondansetron (ZOFRAN-ODT) disintegrating tablet 8 mg (8 mg Oral Given 07/06/18 1456)     Initial Impression / Assessment and Plan / ED Course  I have reviewed the triage vital signs and the nursing notes.  Pertinent labs & imaging results that were available during my care of the patient were reviewed by me and considered in my medical decision making (see chart for details).  Clinical Course as of Jul 07 1551  Sun Jul 06, 2018  1550 Pt reassessed. Pt's VSS and WNL. Pt's cap refill < 3 seconds. Pt has been hydrated in the ER and now passed po challenge. We will discharge with antiemetic. Strict ER return precautions have been discussed and pt will return if he is unable to tolerate fluids and symptoms are getting worse.    [AN]    Clinical Course User Index [AN] Varney Biles, MD    27 year old female comes in with chief complaint of abdominal discomfort, nausea, one episode of emesis and cough with blood tinged phlegm.  Abdominal exam is not peritoneal.  Patient is immunocompetent, she does not need a CT abdomen or any labs.  We will get urine pregnancy test.  Additionally, patient does not have any focal lung finding, however given her hemoptysis we will get x-ray of her chest.  Final Clinical Impressions(s) / ED Diagnoses   Final diagnoses:  Non-intractable vomiting with nausea, unspecified vomiting type    ED Discharge Orders        Ordered    ondansetron (ZOFRAN ODT) 8 MG disintegrating tablet  Every 8 hours PRN     07/06/18 1551       Varney Biles, MD 07/06/18 [place], Oceanna Arruda, MD 07/06/18 1552

## 2018-08-13 ENCOUNTER — Encounter: Payer: Self-pay | Admitting: Obstetrics and Gynecology

## 2018-08-22 ENCOUNTER — Encounter: Payer: Self-pay | Admitting: Obstetrics and Gynecology

## 2018-09-01 ENCOUNTER — Emergency Department (HOSPITAL_COMMUNITY)
Admission: EM | Admit: 2018-09-01 | Discharge: 2018-09-02 | Disposition: A | Discharge status: Home / Self Care | Payer: Self-pay | Attending: Emergency Medicine | Admitting: Emergency Medicine

## 2018-09-01 ENCOUNTER — Other Ambulatory Visit: Payer: Self-pay

## 2018-09-01 ENCOUNTER — Encounter (HOSPITAL_COMMUNITY): Payer: Self-pay | Admitting: Emergency Medicine

## 2018-09-01 DIAGNOSIS — R112 Nausea with vomiting, unspecified: Secondary | ICD-10-CM | POA: Insufficient documentation

## 2018-09-01 DIAGNOSIS — R1030 Lower abdominal pain, unspecified: Secondary | ICD-10-CM | POA: Insufficient documentation

## 2018-09-01 DIAGNOSIS — J45909 Unspecified asthma, uncomplicated: Secondary | ICD-10-CM | POA: Insufficient documentation

## 2018-09-01 DIAGNOSIS — R197 Diarrhea, unspecified: Secondary | ICD-10-CM | POA: Insufficient documentation

## 2018-09-01 DIAGNOSIS — R5383 Other fatigue: Secondary | ICD-10-CM | POA: Insufficient documentation

## 2018-09-01 LAB — CBC
HCT: 41.5 % (ref 36.0–46.0)
Hemoglobin: 13.4 g/dL (ref 12.0–15.0)
MCH: 31.7 pg (ref 26.0–34.0)
MCHC: 32.3 g/dL (ref 30.0–36.0)
MCV: 98.1 fL (ref 78.0–100.0)
PLATELETS: 224 10*3/uL (ref 150–400)
RBC: 4.23 MIL/uL (ref 3.87–5.11)
RDW: 12.8 % (ref 11.5–15.5)
WBC: 6.4 10*3/uL (ref 4.0–10.5)

## 2018-09-01 LAB — I-STAT BETA HCG BLOOD, ED (MC, WL, AP ONLY)

## 2018-09-01 NOTE — ED Triage Notes (Signed)
Pt reports lower abdominal pain, worsening today. Pt reports she is currently on her period. Pt reports having an ultrasound done and dx with "blood blockage". Pt reports she is supposed to have a biopsy done to test for ovarian cancer. Pt reports nausea and vomiting.

## 2018-09-02 LAB — COMPREHENSIVE METABOLIC PANEL
ALBUMIN: 3.6 g/dL (ref 3.5–5.0)
ALK PHOS: 56 U/L (ref 38–126)
ALT: 12 U/L (ref 0–44)
AST: 15 U/L (ref 15–41)
Anion gap: 9 (ref 5–15)
BILIRUBIN TOTAL: 0.3 mg/dL (ref 0.3–1.2)
BUN: 16 mg/dL (ref 6–20)
CALCIUM: 9.1 mg/dL (ref 8.9–10.3)
CO2: 22 mmol/L (ref 22–32)
CREATININE: 1.09 mg/dL — AB (ref 0.44–1.00)
Chloride: 108 mmol/L (ref 98–111)
GFR calc Af Amer: >60 mL/min (ref >60)
GFR calc non Af Amer: >60 mL/min (ref >60)
GLUCOSE: 105 mg/dL — AB (ref 70–99)
Potassium: 3.8 mmol/L (ref 3.5–5.1)
SODIUM: 139 mmol/L (ref 135–145)
Total Protein: 6.8 g/dL (ref 6.5–8.1)

## 2018-09-02 LAB — URINALYSIS, ROUTINE W REFLEX MICROSCOPIC
BILIRUBIN URINE: NEGATIVE
Bacteria, UA: NONE SEEN
Glucose, UA: NEGATIVE mg/dL
Ketones, ur: NEGATIVE mg/dL
LEUKOCYTES UA: NEGATIVE
NITRITE: NEGATIVE
PH: 5 (ref 5.0–8.0)
Protein, ur: NEGATIVE mg/dL
SPECIFIC GRAVITY, URINE: 1.018 (ref 1.005–1.030)

## 2018-09-02 LAB — LIPASE, BLOOD: Lipase: 62 U/L — ABNORMAL HIGH (ref 11–51)

## 2018-09-02 MED ORDER — MELOXICAM 7.5 MG PO TABS: 7.5000 mg | ORAL_TABLET | Freq: Every day | ORAL | 0 refills | Status: AC

## 2018-09-02 MED ORDER — ACETAMINOPHEN 325 MG PO TABS
650.0000 mg | ORAL_TABLET | Freq: Once | ORAL | Status: AC
  Administered 2018-09-02: 650 mg via ORAL
  Filled 2018-09-02: qty 2

## 2018-09-02 MED ORDER — ONDANSETRON 4 MG PO TBDP
4.0000 mg | ORAL_TABLET | Freq: Once | ORAL | Status: AC
  Administered 2018-09-02: 4 mg via ORAL
  Filled 2018-09-02: qty 1

## 2018-09-02 NOTE — Discharge Instructions (Signed)
Please follow up with OBGYN on September 10, 2018. If your symptoms worsen or you experience any severe abdominal pain, vaginal bleeding or shortness of breath return to the ED for reevaluation.

## 2018-09-02 NOTE — ED Provider Notes (Signed)
Patient seen/examined in the Emergency Department in conjunction with Midlevel Provider [place] Patient reports lower abd pain, similar to prior episodes Exam : sleeping but arousable, no distress, abd soft/nontender Plan: Discharge home, she reports she has follow-up next week with OB/GYN for possible biopsy.  Patient appropriate discharge    Ripley Fraise, MD 09/02/18 [phone]

## 2018-09-02 NOTE — ED Notes (Signed)
Patient would not sign discharge note.

## 2018-09-02 NOTE — ED Provider Notes (Signed)
[facility] EMERGENCY DEPARTMENT Provider Note   CSN: [id number] Arrival date & time: 09/01/18  2247     History   Chief Complaint Chief Complaint  Patient presents with  . Abdominal Pain    HPI Barbara Lam is a 27 y.o. female.  27 y/o female with a PMH Asthma and chronic headaches presents to the ED with a chief complaint of abdominal pain x 1 month.Patient was seen at [facility] in July and had US done which showed "blockage in uterus and size of uterus 14 mm".She reports her abdominal pain as constant sharp with no radiation. She also reports 3-4 episodes of diarrhea today and couple episodes of vomiting.  She also reports some nausea along with weakness and is feeling very tired.  Patient also reports that her blood pressure has been high at the last couple of doctor's visits but they have not address this, she states "I am not leaving until they lower my blood pressure ".  She denies any fever, chest pain, shortness of breath, urinary symptoms, vaginal discharge.     Past Medical History:  Diagnosis Date  . Asthma   . Chronic headaches     There are no active problems to display for this patient.   Past Surgical History:  Procedure Laterality Date  . EYE SURGERY       OB History    Gravida  1   Para  1   Term  1   Preterm      AB      Living  1     SAB      TAB      Ectopic      Multiple      Live Births               Home Medications    Prior to Admission medications   Medication Sig Start Date End Date Taking? Authorizing Provider  medroxyPROGESTERone (PROVERA) 10 MG tablet Take 10 mg by mouth See admin instructions. Take 10 mg by mouth two times a day until bleeding stops then take remaining tablets once a day until finished 06/26/18  Yes [provider]  PROVENTIL HFA 108 (90 Base) MCG/ACT inhaler Inhale 1-2 puffs into the lungs every 6 (six) hours as needed for wheezing or shortness of breath.  07/09/18   Yes [provider]  ondansetron (ZOFRAN ODT) 8 MG disintegrating tablet Take 1 tablet (8 mg total) by mouth every 8 (eight) hours as needed for nausea. Patient not taking: Reported on 09/02/2018 07/06/18   Varney Biles, MD  ondansetron (ZOFRAN) 4 MG tablet Take 1 tablet (4 mg total) by mouth every 6 (six) hours as needed for nausea or vomiting. Patient not taking: Reported on 03/07/253 01/23/05   Delora Fuel, MD  oxyCODONE-acetaminophen (PERCOCET) 5-325 MG tablet Take 1 tablet by mouth every 4 (four) hours as needed for moderate pain. Patient not taking: Reported on 2/37/6283 12/22/15   Delora Fuel, MD    Family History Family History  Problem Relation Age of Onset  . Diabetes Other     Social History Social History   Tobacco Use  . Smoking status: Never Smoker  . Smokeless tobacco: Never Used  Substance Use Topics  . Alcohol use: Yes    Comment: occ  . Drug use: No     Allergies   Tomato   Review of Systems Review of Systems  Constitutional: Negative for fever.  HENT: Negative for sinus  pain and sore throat.   Respiratory: Negative for cough, shortness of breath and wheezing.   Cardiovascular: Negative for chest pain.  Gastrointestinal: Positive for abdominal pain, nausea and vomiting.  Genitourinary: Negative for dysuria, flank pain, hematuria, menstrual problem, pelvic pain, urgency, vaginal bleeding, vaginal discharge and vaginal pain.  Musculoskeletal: Negative for back pain and myalgias.  Skin: Negative for rash and wound.  Neurological: Positive for weakness. Negative for speech difficulty and headaches.  All other systems reviewed and are negative.    Physical Exam Updated Vital Signs BP (!) 132/106 (BP Location: Right Arm)   Pulse 87   Temp 98.8 F (37.1 C) (Oral)   Resp 17   SpO2 100%   Physical Exam  Constitutional: She appears well-developed and well-nourished.  HENT:  Head: Normocephalic and atraumatic.  Cardiovascular: Normal heart  sounds.  Pulmonary/Chest: Effort normal and breath sounds normal. She has no wheezes.  Abdominal: Soft. Normal appearance and bowel sounds are normal. There is tenderness in the suprapubic area. There is no rebound, no guarding, no CVA tenderness, no tenderness at McBurney's point and negative Murphy's sign.  Skin: Skin is warm and dry.  Nursing note and vitals reviewed.    ED Treatments / Results  Labs (all labs ordered are listed, but only abnormal results are displayed) Labs Reviewed  LIPASE, BLOOD - Abnormal; Notable for the following components:      Result Value   Lipase 62 (*)    All other components within normal limits  COMPREHENSIVE METABOLIC PANEL - Abnormal; Notable for the following components:   Glucose, Bld 105 (*)    Creatinine, Ser 1.09 (*)    All other components within normal limits  URINALYSIS, ROUTINE W REFLEX MICROSCOPIC - Abnormal; Notable for the following components:   Hgb urine dipstick SMALL (*)    All other components within normal limits  CBC  I-STAT BETA HCG BLOOD, ED ([facility], [facility], [facility] ONLY)    EKG None  Radiology No results found.  Procedures Procedures (including critical care time)  Medications Ordered in ED Medications  ondansetron (ZOFRAN-ODT) disintegrating tablet 4 mg (4 mg Oral Given 09/02/18 0700)  acetaminophen (TYLENOL) tablet 650 mg (650 mg Oral Given 09/02/18 0659)     Initial Impression / Assessment and Plan / ED Course  I have reviewed the triage vital signs and the nursing notes.  Pertinent labs & imaging results that were available during my care of the patient were reviewed by me and considered in my medical decision making (see chart for details).  Clinical Course as of Sep 02 733  Tue Sep 02, 2018  0624 Creatinine(!): 1.09 [JS]    Clinical Course User Index [JS] Janeece Fitting, [place]    Patient presents to the ED with abd pain is 1 month.  Patient's primary care is the [facility] who actually had  order an ultrasound back in July which show her Dmitriy and measures 14 mm.  Patient states she has an appointment with OB/GYN on September 25.  Patient is also hypertensive upon arrival, she reports her blood pressure has been high and her last doctor's visits and states "I am not leaving today until you lower my blood pressure ".  I have explained to patient the risks of hypoperfusion to the brain with lowering blood pressure.  CMP showed no electrolyte abnormality, LFTs are within normal limits, making this less likely a hepatic pathology.  CBC showed no leukocytosis, patient is afebrile.  UA shows small amount of  blood, patient is currently on her cycle.  Lipase was slightly elevated but patient does not exhibit any upper lower quadrant pain.  At this time patient has a appointment with OB/GYN next week and will follow up with them for her ultrasound.  Patient's pain has improved after Tylenol.  I have advised patient that she needs to talk to her PCP about her blood pressure being high and management of her blood pressure.  She and agrees with plan.  Dr. Christy Gentles has seen this patient with me.  Vitals stable, patient stable for discharge currently resting in bed.  Return precautions provided.  Final Clinical Impressions(s) / ED Diagnoses   Final diagnoses:  Lower abdominal pain    ED Discharge Orders    None       Janeece Fitting, PA-C 09/02/18 [place], Donald, MD 09/02/18 [phone]

## 2018-09-02 NOTE — ED Notes (Signed)
Pt ambulatory to [facility] for urine sample

## 2018-09-10 ENCOUNTER — Ambulatory Visit (INDEPENDENT_AMBULATORY_CARE_PROVIDER_SITE_OTHER): Payer: Self-pay | Admitting: Obstetrics and Gynecology

## 2018-09-10 ENCOUNTER — Encounter: Payer: Self-pay | Admitting: Obstetrics and Gynecology

## 2018-09-10 VITALS — BP 144/102 | HR 87 | Ht 62.0 in | Wt 148.8 lb

## 2018-09-10 DIAGNOSIS — I10 Essential (primary) hypertension: Secondary | ICD-10-CM

## 2018-09-10 DIAGNOSIS — N939 Abnormal uterine and vaginal bleeding, unspecified: Secondary | ICD-10-CM

## 2018-09-10 MED ORDER — LISINOPRIL 10 MG PO TABS: 10.0000 mg | ORAL_TABLET | Freq: Every day | ORAL | 5 refills | Status: DC

## 2018-09-10 NOTE — Progress Notes (Signed)
Patient ID: Barbara Lam, female   DOB: June 19, 1991, 27 y.o.   MRN: [id number]    [facility] Visit  @DATE @            Patient name: Barbara Lam MRN [id number]  Date of birth: October 16, 1991  CC & HPI:  Barbara Lam is a 27 y.o. female presenting today for menorrhagia She did birth control and made her periods heavier, was given megace to stop periods, her tubes aren't tied and has no plans for children with none of her own. Doctor told her that she had blood blockage in her uterus. She still takes provera when she has light spotting that is not associated with her period, was taking only provera with natural period with no other meds. Her BP has recently become elevated and can tell when it is elevated she notes that she becomes sluggish. She has BP cuff at home and would like BP pill  Had u/s 06/26/18 showed no fibroids, no masses, no focal abnormality, ovaries normal.  ROS:  ROS +menorrhagia -fever -chills All systems are negative except as noted in the HPI and PMH.   Pertinent History Reviewed:   Reviewed: Significant for none Medical         Past Medical History:  Diagnosis Date  . Asthma   . Chronic headaches   . Hypertension                               Surgical Hx:    Past Surgical History:  Procedure Laterality Date  . EYE SURGERY     Medications: Reviewed & Updated - see associated section                       Current Outpatient Medications:  .  medroxyPROGESTERone (PROVERA) 10 MG tablet, Take 10 mg by mouth See admin instructions. Take 10 mg by mouth two times a day until bleeding stops then take remaining tablets once a day until finished, Disp: , Rfl: 0 .  meloxicam (MOBIC) 7.5 MG tablet, Take 1 tablet (7.5 mg total) by mouth daily for 10 days., Disp: 10 tablet, Rfl: 0 .  ondansetron (ZOFRAN ODT) 8 MG disintegrating tablet, Take 1 tablet (8 mg total) by mouth every 8 (eight) hours as needed for nausea. (Patient not taking: Reported on 09/02/2018), Disp:  20 tablet, Rfl: 0 .  ondansetron (ZOFRAN) 4 MG tablet, Take 1 tablet (4 mg total) by mouth every 6 (six) hours as needed for nausea or vomiting. (Patient not taking: Reported on 09/02/2018), Disp: 12 tablet, Rfl: 0 .  oxyCODONE-acetaminophen (PERCOCET) 5-325 MG tablet, Take 1 tablet by mouth every 4 (four) hours as needed for moderate pain. (Patient not taking: Reported on 09/02/2018), Disp: 6 tablet, Rfl: 0 .  PROVENTIL HFA 108 (90 Base) MCG/ACT inhaler, Inhale 1-2 puffs into the lungs every 6 (six) hours as needed for wheezing or shortness of breath. , Disp: , Rfl: 0   Social History: Reviewed -  reports that she has never smoked. She has never used smokeless tobacco.  Objective Findings:  Vitals: Blood pressure (!) 144/102, pulse 87, height 5\' 2"  (1.575 m), weight 148 lb 12.8 oz (67.5 kg).  PHYSICAL EXAMINATION General appearance - alert, well appearing, and in no distress and oriented to person, place, and time Mental status - alert, oriented to person, place, and time, normal mood, behavior, speech, dress, motor activity, and  thought processes, affect appropriate to mood  PELVIC NOT DONE  Assessment & Plan:   A:  1. Menorrhagia 2. HTN, essential  P:  1. Rx lisinopril 10  2. Consider IUD, research costs   Discussion: 1. Discussed with pt benefits of IUD for menstrual control 2 pt to f/u after pricing IUD's  At end of discussion, pt had opportunity to ask questions and has no further questions at this time.   Specific discussion of IUD as noted above. Greater than 50% was spent in counseling and coordination of care with the patient.   Total time greater than: 15 minutes.    By signing my name below, I, Samul Dada, attest that this documentation has been prepared under the direction and in the presence of Jonnie Kind, MD. Electronically Signed: [place]. 09/10/18. 11:45 AM.  I personally performed the services described in this documentation,  which was SCRIBED in my presence. The recorded information has been reviewed and considered accurate. It has been edited as necessary during review. Jonnie Kind, MD

## 2018-09-18 ENCOUNTER — Telehealth: Payer: Self-pay | Admitting: *Deleted

## 2018-09-18 MED ORDER — AMLODIPINE BESYLATE 10 MG PO TABS: 10.0000 mg | ORAL_TABLET | Freq: Every day | ORAL | 6 refills | Status: DC

## 2018-09-18 NOTE — Telephone Encounter (Signed)
Mailbox full, sent rx for norvasc

## 2018-09-18 NOTE — Telephone Encounter (Signed)
Called patient back and heard voicemail message that mailbox is full.

## 2018-09-18 NOTE — Telephone Encounter (Signed)
Pt called back and stated that she is breaking out on her arms, stomach and back and it is very itchy. She has used benadryl. Only new medication is lisinopril. She would like new bp med called to [facility].

## 2018-09-28 ENCOUNTER — Ambulatory Visit (HOSPITAL_COMMUNITY)
Admission: EM | Admit: 2018-09-28 | Discharge: 2018-09-28 | Disposition: A | Discharge status: Home / Self Care | Payer: Self-pay | Attending: Family Medicine | Admitting: Family Medicine

## 2018-09-28 ENCOUNTER — Ambulatory Visit (INDEPENDENT_AMBULATORY_CARE_PROVIDER_SITE_OTHER): Payer: Self-pay

## 2018-09-28 ENCOUNTER — Encounter (HOSPITAL_COMMUNITY): Payer: Self-pay | Admitting: Emergency Medicine

## 2018-09-28 DIAGNOSIS — J4521 Mild intermittent asthma with (acute) exacerbation: Secondary | ICD-10-CM

## 2018-09-28 DIAGNOSIS — J069 Acute upper respiratory infection, unspecified: Secondary | ICD-10-CM

## 2018-09-28 DIAGNOSIS — B9789 Other viral agents as the cause of diseases classified elsewhere: Secondary | ICD-10-CM

## 2018-09-28 MED ORDER — ALBUTEROL SULFATE HFA 108 (90 BASE) MCG/ACT IN AERS
INHALATION_SPRAY | RESPIRATORY_TRACT | Status: AC
  Filled 2018-09-28: qty 6.7

## 2018-09-28 MED ORDER — ALBUTEROL SULFATE HFA 108 (90 BASE) MCG/ACT IN AERS
2.0000 | INHALATION_SPRAY | Freq: Once | RESPIRATORY_TRACT | Status: AC
  Administered 2018-09-28: 2 via RESPIRATORY_TRACT

## 2018-09-28 MED ORDER — PREDNISONE 20 MG PO TABS: 20.0000 mg | ORAL_TABLET | Freq: Two times a day (BID) | ORAL | 0 refills | Status: AC

## 2018-09-28 NOTE — ED Provider Notes (Signed)
[facility]   [id number] 09/28/18 Arrival Time: 5809  CC: Cough  SUBJECTIVE:  Barbara Lam is a 27 y.o. female hx of asthma, tobacco use, who presents with worsening cough x 1 week.  Denies positive sick exposure or precipitating event.  Describes cough as constant and productive with yellow sputum.  Has tried mucinex D without relief.  Symptoms are made worse at night.  Reports previous symptoms in the past.  Reports associated fatigue, SOB, and wheezing. Denies fever, chills, sinus pain, rhinorrhea, sore throat, chest pain, nausea, changes in bowel or bladder habits.    Denies recent travel, surgery, calf pain, hemoptysis or estrogen use.  Denies past hx of blood clots.    ROS: As per HPI.  Past Medical History:  Diagnosis Date  . Asthma   . Chronic headaches   . Hypertension    Past Surgical History:  Procedure Laterality Date  . EYE SURGERY     Allergies  Allergen Reactions  . Lisinopril   . Tomato Hives   No current facility-administered medications on file prior to encounter.    Current Outpatient Medications on File Prior to Encounter  Medication Sig Dispense Refill  . amLODipine (NORVASC) 10 MG tablet Take 1 tablet (10 mg total) by mouth daily. 30 tablet 6  . medroxyPROGESTERone (PROVERA) 10 MG tablet Take 10 mg by mouth See admin instructions. Take 10 mg by mouth two times a day until bleeding stops then take remaining tablets once a day until finished  0  . PROVENTIL HFA 108 (90 Base) MCG/ACT inhaler Inhale 1-2 puffs into the lungs every 6 (six) hours as needed for wheezing or shortness of breath.   0    Social History   Socioeconomic History  . Marital status: Single    Spouse name: Not on file  . Number of children: Not on file  . Years of education: Not on file  . Highest education level: Not on file  Occupational History  . Not on file  Social Needs  . Financial resource strain: Not on file  . Food insecurity:    Worry: Not on file   Inability: Not on file  . Transportation needs:    Medical: Not on file    Non-medical: Not on file  Tobacco Use  . Smoking status: Never Smoker  . Smokeless tobacco: Never Used  Substance and Sexual Activity  . Alcohol use: Yes    Comment: occ  . Drug use: No  . Sexual activity: Not Currently    Birth control/protection: None  Lifestyle  . Physical activity:    Days per week: Not on file    Minutes per session: Not on file  . Stress: Not on file  Relationships  . Social connections:    Talks on phone: Not on file    Gets together: Not on file    Attends religious service: Not on file    Active member of club or organization: Not on file    Attends meetings of clubs or organizations: Not on file    Relationship status: Not on file  . Intimate partner violence:    Fear of current or ex partner: Not on file    Emotionally abused: Not on file    Physically abused: Not on file    Forced sexual activity: Not on file  Other Topics Concern  . Not on file  Social History Narrative  . Not on file   Family History  Problem Relation Age of  Onset  . Diabetes Other      OBJECTIVE:  Vitals:   09/28/18 1738 09/28/18 1819  BP: (!) 120/101 124/83  Pulse: (!) 122 (!) 111  Resp: 18   Temp: 98.8 F (37.1 C)   SpO2: 100% 98%     General appearance: Alert; appears fatigued, but nontoxic HEENT: NCAT; Ears: EACs clear, TMs pearly gray; Eyes: PERRL.  EOM grossly intact. Nose: mild clear rhinorrhea; tonsils nonerythematous, uvula midline Neck: supple without LAD Lungs: clear to auscultation bilaterally without adventitious breath sounds Heart: tachycardic.  Radial pulses 2+ symmetrical bilaterally Skin: warm and dry Neuro: CN 2-12 intact; strength and sensation intact and symmetrical about the upper and lower extremities; negative pronator drift; finger to nose without difficulty Psychological: alert and cooperative; normal mood and affect  DIAGNOSTIC STUDIES:  Dg Chest 2  View  Result Date: 09/28/2018 CLINICAL DATA:  Progressive cough.  Shortness of breath. EXAM: CHEST - 2 VIEW COMPARISON:  07/06/2018 FINDINGS: The heart size and mediastinal contours are within normal limits. Both lungs are clear. The visualized skeletal structures are unremarkable. IMPRESSION: Normal exam. Electronically Signed   By: Lorriane Shire M.D.   On: 09/28/2018 18:10    ASSESSMENT & PLAN:  1. Viral URI with cough   2. Mild intermittent asthma with exacerbation    Discussed patient case with Dr. Joseph Art.    Meds ordered this encounter  Medications  . predniSONE (DELTASONE) 20 MG tablet    Sig: Take 1 tablet (20 mg total) by mouth 2 (two) times daily with a meal for 3 days.    Dispense:  6 tablet    Refill:  0    Order Specific Question:   Supervising Provider    Answer:   Wynona Luna [phone]  . albuterol (PROVENTIL HFA;VENTOLIN HFA) 108 (90 Base) MCG/ACT inhaler 2 puff   Inhaler given in office.  Use as needed for shortness of breath or wheezing Get plenty of rest and push fluids Prescribed prednisone.  Take as directed and to completion Follow up with PCP if symptoms persist Return or go to ER if you have any new or worsening symptoms such as worsening cough, fever, chills, nausea, vomiting, chest pain, etc...  Reviewed expectations re: course of current medical issues. Questions answered. Outlined signs and symptoms indicating need for more acute intervention. Patient verbalized understanding. After Visit Summary given.          Shunte, [place], PA-C 09/28/18 1842

## 2018-09-28 NOTE — ED Triage Notes (Signed)
Pt c/o productive cough x1 week.

## 2018-09-28 NOTE — Discharge Instructions (Signed)
Inhaler given in office.  Use as needed for shortness of breath or wheezing Get plenty of rest and push fluids Prescribed prednisone.  Take as directed and to completion Follow up with PCP if symptoms persist Return or go to ER if you have any new or worsening symptoms such as worsening cough, fever, chills, nausea, vomiting, chest pain, etc..Marland Kitchen

## 2018-10-07 ENCOUNTER — Encounter (HOSPITAL_COMMUNITY): Payer: Self-pay | Admitting: Emergency Medicine

## 2018-10-07 ENCOUNTER — Ambulatory Visit (HOSPITAL_COMMUNITY)
Admission: EM | Admit: 2018-10-07 | Discharge: 2018-10-07 | Disposition: A | Discharge status: Home / Self Care | Payer: Self-pay | Attending: Family Medicine | Admitting: Family Medicine

## 2018-10-07 DIAGNOSIS — R112 Nausea with vomiting, unspecified: Secondary | ICD-10-CM

## 2018-10-07 DIAGNOSIS — R197 Diarrhea, unspecified: Secondary | ICD-10-CM

## 2018-10-07 MED ORDER — DICYCLOMINE HCL 20 MG PO TABS: 20.0000 mg | ORAL_TABLET | Freq: Two times a day (BID) | ORAL | 0 refills | Status: DC

## 2018-10-07 MED ORDER — ONDANSETRON 4 MG PO TBDP: 4.0000 mg | ORAL_TABLET | Freq: Three times a day (TID) | ORAL | 0 refills | Status: DC | PRN

## 2018-10-07 NOTE — Discharge Instructions (Signed)
Zofran for nausea and vomiting as needed. Bentyl for stomach pain. Keep hydrated, you urine should be clear to pale yellow in color. Bland diet, advance as tolerated. Monitor for any worsening of symptoms, nausea or vomiting not controlled by medication, worsening abdominal pain, fever, follow-up for reevaluation.

## 2018-10-07 NOTE — ED Triage Notes (Signed)
Pt states "I think I ate something bad" c/o vomiting and diarrhea x2 days.

## 2018-10-07 NOTE — ED Provider Notes (Signed)
[facility]    CSN: [id number] Arrival date & time: 10/07/18  1231     History   Chief Complaint Chief Complaint  Patient presents with  . Emesis  . Diarrhea    HPI KLOI BRODMAN is a 27 y.o. female.   28 year old female comes in for 2 day history of nausea, vomiting, diarrhea. States she had fast foot prior to symptom onset and thinks it is due to that. She has had 2-3 episodes of nonbilious nonbloody vomiting. Has had 3 episodes of watery diarrhea per day. Denies melena, hematochezia. States abdominal pain that is mostly to the lower abdomen, intermittent, can be sharp in nature, worse while moving her bowels. She has not eaten, but has been tolerating fluids. Denies fever, chills, night sweats.      Past Medical History:  Diagnosis Date  . Asthma   . Chronic headaches   . Hypertension     There are no active problems to display for this patient.   Past Surgical History:  Procedure Laterality Date  . EYE SURGERY      OB History    Gravida  0   Para  0   Term  0   Preterm      AB      Living        SAB      TAB      Ectopic      Multiple      Live Births               Home Medications    Prior to Admission medications   Medication Sig Start Date End Date Taking? Authorizing Provider  amLODipine (NORVASC) 10 MG tablet Take 1 tablet (10 mg total) by mouth daily. 09/18/18   Estill Dooms, NP  dicyclomine (BENTYL) 20 MG tablet Take 1 tablet (20 mg total) by mouth 2 (two) times daily. 10/07/18   Tasia Catchings, Amy V, PA-C  medroxyPROGESTERone (PROVERA) 10 MG tablet Take 10 mg by mouth See admin instructions. Take 10 mg by mouth two times a day until bleeding stops then take remaining tablets once a day until finished 06/26/18   [provider]  ondansetron (ZOFRAN ODT) 4 MG disintegrating tablet Take 1 tablet (4 mg total) by mouth every 8 (eight) hours as needed for nausea or vomiting. 10/07/18   Ok Edwards, PA-C  PROVENTIL HFA  [phone] Base) MCG/ACT inhaler Inhale 1-2 puffs into the lungs every 6 (six) hours as needed for wheezing or shortness of breath.  07/09/18   [provider]    Family History Family History  Problem Relation Age of Onset  . Diabetes Other     Social History Social History   Tobacco Use  . Smoking status: Never Smoker  . Smokeless tobacco: Never Used  Substance Use Topics  . Alcohol use: Yes    Comment: occ  . Drug use: No     Allergies   Lisinopril and Tomato   Review of Systems Review of Systems  Reason unable to perform ROS: See HPI as above.     Physical Exam Triage Vital Signs ED Triage Vitals  Enc Vitals Group     BP 10/07/18 1249 115/70     Pulse Rate 10/07/18 1249 (!) 101     Resp 10/07/18 1249 18     Temp 10/07/18 1249 98.7 F (37.1 C)     Temp src --      SpO2  10/07/18 1249 99 %     Weight --      Height --      Head Circumference --      Peak Flow --      Pain Score 10/07/18 1250 0     Pain Loc --      Pain Edu? --      Excl. in [place]? --    No data found.  Updated Vital Signs BP 115/70   Pulse (!) 101   Temp 98.7 F (37.1 C)   Resp 18   LMP 09/08/2018 (Exact Date) Comment: denies pregnancy  SpO2 99%   Physical Exam  Constitutional: She is oriented to person, place, and time. She appears well-developed and well-nourished. No distress.  HENT:  Head: Normocephalic and atraumatic.  Eyes: Pupils are equal, round, and reactive to light. Conjunctivae are normal.  Cardiovascular: Normal rate, regular rhythm and normal heart sounds. Exam reveals no gallop and no friction rub.  No murmur heard. Pulmonary/Chest: Effort normal and breath sounds normal. She has no wheezes. She has no rales.  Abdominal: Soft. She exhibits no mass. Bowel sounds are increased. There is no tenderness. There is no rigidity, no rebound, no guarding and no CVA tenderness.  Neurological: She is alert and oriented to person, place, and time.  Skin: Skin is warm and  dry.  Psychiatric: She has a normal mood and affect. Her behavior is normal. Judgment normal.     UC Treatments / Results  Labs (all labs ordered are listed, but only abnormal results are displayed) Labs Reviewed - No data to display  EKG None  Radiology No results found.  Procedures Procedures (including critical care time)  Medications Ordered in UC Medications - No data to display  Initial Impression / Assessment and Plan / UC Course  I have reviewed the triage vital signs and the nursing notes.  Pertinent labs & imaging results that were available during my care of the patient were reviewed by me and considered in my medical decision making (see chart for details).    Discussed with patient no alarming signs on exam. Zofran for nausea. Bentyl as directed. Push fluids. Bland diet, advance as tolerated. Return precautions given.  Final Clinical Impressions(s) / UC Diagnoses   Final diagnoses:  Nausea vomiting and diarrhea    ED Prescriptions    Medication Sig Dispense Auth. Provider   ondansetron (ZOFRAN ODT) 4 MG disintegrating tablet Take 1 tablet (4 mg total) by mouth every 8 (eight) hours as needed for nausea or vomiting. 20 tablet Yu, Amy V, PA-C   dicyclomine (BENTYL) 20 MG tablet Take 1 tablet (20 mg total) by mouth 2 (two) times daily. 20 tablet Tobin Chad, [place] 10/07/18 1334

## 2018-10-08 ENCOUNTER — Telehealth: Payer: Self-pay | Admitting: Obstetrics and Gynecology

## 2018-10-08 ENCOUNTER — Encounter: Payer: Self-pay | Admitting: *Deleted

## 2018-10-08 ENCOUNTER — Ambulatory Visit: Payer: Self-pay | Admitting: Obstetrics and Gynecology

## 2018-10-08 NOTE — Telephone Encounter (Signed)
Both patient and mother's phone numbers are blocked from receiving calls so that no confirmation of appt can be made. Unable to reach pt to see what her future plans are re: IUD.

## 2018-10-10 ENCOUNTER — Telehealth: Payer: Self-pay | Admitting: Obstetrics & Gynecology

## 2018-10-10 NOTE — Telephone Encounter (Signed)
Attempted to call patient but phone has calling restrictions.  Unable to leave message.

## 2018-10-10 NOTE — Telephone Encounter (Signed)
Patient called, requesting something be called in for her to stop her cycle.  She stated that she has had her cycle for two months now.  Kristopher Oppenheim on [address]  [phone]

## 2018-10-19 ENCOUNTER — Other Ambulatory Visit: Payer: Self-pay

## 2018-10-19 ENCOUNTER — Emergency Department (HOSPITAL_COMMUNITY)
Admission: EM | Admit: 2018-10-19 | Discharge: 2018-10-20 | Disposition: A | Discharge status: Home / Self Care | Payer: Self-pay | Attending: Emergency Medicine | Admitting: Emergency Medicine

## 2018-10-19 DIAGNOSIS — M5441 Lumbago with sciatica, right side: Secondary | ICD-10-CM | POA: Insufficient documentation

## 2018-10-19 DIAGNOSIS — I10 Essential (primary) hypertension: Secondary | ICD-10-CM | POA: Insufficient documentation

## 2018-10-19 DIAGNOSIS — J45909 Unspecified asthma, uncomplicated: Secondary | ICD-10-CM | POA: Insufficient documentation

## 2018-10-19 DIAGNOSIS — Z79899 Other long term (current) drug therapy: Secondary | ICD-10-CM | POA: Insufficient documentation

## 2018-10-19 DIAGNOSIS — X500XXA Overexertion from strenuous movement or load, initial encounter: Secondary | ICD-10-CM | POA: Insufficient documentation

## 2018-10-19 NOTE — ED Triage Notes (Signed)
Pt c/o right lower back pain that radiates down right leg that started this am, denies any injury,

## 2018-10-20 ENCOUNTER — Encounter (HOSPITAL_COMMUNITY): Payer: Self-pay | Admitting: *Deleted

## 2018-10-20 MED ORDER — TRAMADOL HCL 50 MG PO TABS: 50.0000 mg | ORAL_TABLET | Freq: Four times a day (QID) | ORAL | 0 refills | Status: DC | PRN

## 2018-10-20 MED ORDER — CYCLOBENZAPRINE HCL 10 MG PO TABS
10.0000 mg | ORAL_TABLET | Freq: Once | ORAL | Status: AC
  Administered 2018-10-20: 10 mg via ORAL
  Filled 2018-10-20: qty 1

## 2018-10-20 MED ORDER — IBUPROFEN 800 MG PO TABS
800.0000 mg | ORAL_TABLET | Freq: Once | ORAL | Status: AC
  Administered 2018-10-20: 800 mg via ORAL
  Filled 2018-10-20: qty 1

## 2018-10-20 MED ORDER — CYCLOBENZAPRINE HCL 10 MG PO TABS: 10.0000 mg | ORAL_TABLET | Freq: Three times a day (TID) | ORAL | 0 refills | Status: DC | PRN

## 2018-10-20 NOTE — Discharge Instructions (Addendum)
Alternate ice and heat to your lower back.  Follow-up with your primary doctor for recheck if not improving.

## 2018-10-20 NOTE — ED Provider Notes (Signed)
[facility] EMERGENCY DEPARTMENT Provider Note   CSN: [id number] Arrival date & time: 10/19/18  2340     History   Chief Complaint Chief Complaint  Patient presents with  . Back Pain    HPI Barbara Lam is a 27 y.o. female.  HPI   Barbara Lam is a 27 y.o. female who presents to the Emergency Department complaining of right sided low back pain that began one day prior to arrival.  States she was moving heavy boxes yesterday at her job and felt a pain to her lower back that radiates into her upper right leg at times.  She woke with worse pain this morning.  She denies fall, fever, chills, abdominal pain, dysuria, urine or bowel changes, numbness or weakness of the lower extremities.  She took Tylenol earlier today with minimal relief.  Pain is worsens with standing and walking and proves at rest while lying on her left side  Past Medical History:  Diagnosis Date  . Asthma   . Chronic headaches   . Hypertension     There are no active problems to display for this patient.   Past Surgical History:  Procedure Laterality Date  . EYE SURGERY       OB History    Gravida  0   Para  0   Term  0   Preterm      AB      Living        SAB      TAB      Ectopic      Multiple      Live Births               Home Medications    Prior to Admission medications   Medication Sig Start Date End Date Taking? Authorizing Provider  amLODipine (NORVASC) 10 MG tablet Take 1 tablet (10 mg total) by mouth daily. 09/18/18   Estill Dooms, NP  dicyclomine (BENTYL) 20 MG tablet Take 1 tablet (20 mg total) by mouth 2 (two) times daily. 10/07/18   Tasia Catchings, Amy V, PA-C  medroxyPROGESTERone (PROVERA) 10 MG tablet Take 10 mg by mouth See admin instructions. Take 10 mg by mouth two times a day until bleeding stops then take remaining tablets once a day until finished 06/26/18   [provider]  ondansetron (ZOFRAN ODT) 4 MG disintegrating tablet Take 1 tablet (4 mg  total) by mouth every 8 (eight) hours as needed for nausea or vomiting. 10/07/18   Ok Edwards, PA-C  PROVENTIL HFA 108 [phone] Base) MCG/ACT inhaler Inhale 1-2 puffs into the lungs every 6 (six) hours as needed for wheezing or shortness of breath.  07/09/18   [provider]    Family History Family History  Problem Relation Age of Onset  . Diabetes Other     Social History Social History   Tobacco Use  . Smoking status: Never Smoker  . Smokeless tobacco: Never Used  Substance Use Topics  . Alcohol use: Yes    Comment: occ  . Drug use: No     Allergies   Lisinopril and Tomato   Review of Systems Review of Systems  Constitutional: Negative for fever.  Respiratory: Negative for shortness of breath.   Gastrointestinal: Negative for abdominal pain, constipation and vomiting.  Genitourinary: Negative for decreased urine volume, difficulty urinating, dysuria, flank pain and hematuria.  Musculoskeletal: Positive for back pain. Negative for joint swelling.  Skin: Negative for rash.  Neurological: Negative for weakness and numbness.  All other systems reviewed and are negative.    Physical Exam Updated Vital Signs BP 106/71   Pulse 100   Temp 98.5 F (36.9 C) (Oral)   Resp 18   Ht 5\' 2"  (1.575 m)   Wt 68 kg   LMP 10/05/2018   SpO2 98%   BMI 27.44 kg/m   Physical Exam  Constitutional: She is oriented to person, place, and time. She appears well-developed and well-nourished. No distress.  HENT:  Head: Normocephalic and atraumatic.  Neck: Normal range of motion. Neck supple.  Cardiovascular: Normal rate, regular rhythm and intact distal pulses.  DP pulses are strong and palpable bilaterally  Pulmonary/Chest: Effort normal and breath sounds normal. No respiratory distress.  Abdominal: Soft. She exhibits no distension. There is no tenderness.  Musculoskeletal: She exhibits tenderness. She exhibits no edema.       Lumbar back: She exhibits tenderness and pain. She  exhibits normal range of motion, no swelling, no deformity, no laceration and normal pulse.  ttp of the lower right lumbar paraspinal muscles and SI joint space.  No spinal tenderness.  Positive SLR on right at 30 degrees.  Pt has 5/5 strength against resistance of bilateral lower extremities.     Neurological: She is alert and oriented to person, place, and time. She has normal strength. No sensory deficit. She exhibits normal muscle tone. Coordination and gait normal.  Reflex Scores:      Patellar reflexes are 2+ on the right side and 2+ on the left side.      Achilles reflexes are 2+ on the right side and 2+ on the left side. Skin: Skin is warm and dry. Capillary refill takes less than 2 seconds. No rash noted.  Psychiatric: She has a normal mood and affect.  Nursing note and vitals reviewed.    ED Treatments / Results  Labs (all labs ordered are listed, but only abnormal results are displayed) Labs Reviewed - No data to display  EKG None  Radiology No results found.  Procedures Procedures (including critical care time)  Medications Ordered in ED Medications - No data to display   Initial Impression / Assessment and Plan / ED Course  I have reviewed the triage vital signs and the nursing notes.  Pertinent labs & imaging results that were available during my care of the patient were reviewed by me and considered in my medical decision making (see chart for details).     Right-sided low back pain after a lifting and twisting injury.  No fall to suggest indication for imaging.  Patient is ambulatory with a steady gait, no focal neuro deficits on exam.  No concerning symptoms for emergent process.  Likely musculoskeletal.  Patient appropriate for discharge home, agrees to treatment plan and PCP follow-up if needed.  Final Clinical Impressions(s) / ED Diagnoses   Final diagnoses:  Acute right-sided low back pain with right-sided sciatica    ED Discharge Orders    None         Kem Parkinson, PA-C 10/20/18 0034    Orpah Greek, MD 10/20/18 [phone]

## 2018-10-31 ENCOUNTER — Encounter (HOSPITAL_COMMUNITY): Payer: Self-pay

## 2018-10-31 ENCOUNTER — Other Ambulatory Visit: Payer: Self-pay

## 2018-10-31 ENCOUNTER — Emergency Department (HOSPITAL_COMMUNITY)
Admission: EM | Admit: 2018-10-31 | Discharge: 2018-10-31 | Disposition: A | Discharge status: Home / Self Care | Payer: Self-pay | Attending: Emergency Medicine | Admitting: Emergency Medicine

## 2018-10-31 DIAGNOSIS — N8500 Endometrial hyperplasia, unspecified: Secondary | ICD-10-CM | POA: Insufficient documentation

## 2018-10-31 DIAGNOSIS — Z79899 Other long term (current) drug therapy: Secondary | ICD-10-CM | POA: Insufficient documentation

## 2018-10-31 DIAGNOSIS — J45909 Unspecified asthma, uncomplicated: Secondary | ICD-10-CM | POA: Insufficient documentation

## 2018-10-31 DIAGNOSIS — I10 Essential (primary) hypertension: Secondary | ICD-10-CM | POA: Insufficient documentation

## 2018-10-31 DIAGNOSIS — N939 Abnormal uterine and vaginal bleeding, unspecified: Secondary | ICD-10-CM

## 2018-10-31 LAB — CBC WITH DIFFERENTIAL/PLATELET
ABS IMMATURE GRANULOCYTES: 0.02 10*3/uL (ref 0.00–0.07)
BASOS ABS: 0 10*3/uL (ref 0.0–0.1)
Basophils Relative: 1 %
Eosinophils Absolute: 0.1 10*3/uL (ref 0.0–0.5)
Eosinophils Relative: 1 %
HCT: 42.7 % (ref 36.0–46.0)
Hemoglobin: 13.9 g/dL (ref 12.0–15.0)
Immature Granulocytes: 0 %
LYMPHS PCT: 29 %
Lymphs Abs: 1.9 10*3/uL (ref 0.7–4.0)
MCH: 31.7 pg (ref 26.0–34.0)
MCHC: 32.6 g/dL (ref 30.0–36.0)
MCV: 97.5 fL (ref 80.0–100.0)
MONOS PCT: 6 %
Monocytes Absolute: 0.4 10*3/uL (ref 0.1–1.0)
NEUTROS ABS: 4.1 10*3/uL (ref 1.7–7.7)
NEUTROS PCT: 63 %
NRBC: 0 % (ref 0.0–0.2)
Platelets: 262 10*3/uL (ref 150–400)
RBC: 4.38 MIL/uL (ref 3.87–5.11)
RDW: 12.6 % (ref 11.5–15.5)
WBC: 6.4 10*3/uL (ref 4.0–10.5)

## 2018-10-31 LAB — BASIC METABOLIC PANEL
Anion gap: 9 (ref 5–15)
BUN: 11 mg/dL (ref 6–20)
CALCIUM: 9.1 mg/dL (ref 8.9–10.3)
CO2: 20 mmol/L — AB (ref 22–32)
Chloride: 109 mmol/L (ref 98–111)
Creatinine, Ser: 0.75 mg/dL (ref 0.44–1.00)
Glucose, Bld: 77 mg/dL (ref 70–99)
POTASSIUM: 3.4 mmol/L — AB (ref 3.5–5.1)
Sodium: 138 mmol/L (ref 135–145)

## 2018-10-31 LAB — HEPATIC FUNCTION PANEL
ALT: 14 U/L (ref 0–44)
AST: 16 U/L (ref 15–41)
Albumin: 4.1 g/dL (ref 3.5–5.0)
Alkaline Phosphatase: 60 U/L (ref 38–126)
BILIRUBIN DIRECT: 0.1 mg/dL (ref 0.0–0.2)
Indirect Bilirubin: 0.6 mg/dL (ref 0.3–0.9)
TOTAL PROTEIN: 8.4 g/dL — AB (ref 6.5–8.1)
Total Bilirubin: 0.7 mg/dL (ref 0.3–1.2)

## 2018-10-31 MED ORDER — HYDROCODONE-ACETAMINOPHEN 5-325 MG PO TABS
2.0000 | ORAL_TABLET | Freq: Once | ORAL | Status: AC
  Administered 2018-10-31: 2 via ORAL
  Filled 2018-10-31: qty 2

## 2018-10-31 MED ORDER — TRAMADOL HCL 50 MG PO TABS: ORAL_TABLET | ORAL | 0 refills | Status: DC

## 2018-10-31 MED ORDER — IBUPROFEN 400 MG PO TABS: 400.0000 mg | ORAL_TABLET | Freq: Four times a day (QID) | ORAL | 0 refills | Status: DC

## 2018-10-31 MED ORDER — ONDANSETRON HCL 4 MG PO TABS
4.0000 mg | ORAL_TABLET | Freq: Once | ORAL | Status: AC
  Administered 2018-10-31: 4 mg via ORAL
  Filled 2018-10-31: qty 1

## 2018-10-31 NOTE — ED Provider Notes (Signed)
[facility] EMERGENCY DEPARTMENT Provider Note   CSN: [id number] Arrival date & time: 10/31/18  1725     History   Chief Complaint Chief Complaint  Patient presents with  . Vaginal Bleeding    HPI Barbara Lam is a 27 y.o. female.  Patient is a 27 year old female who presents to the emergency department because of vaginal bleeding.  The patient states that she has been having some vaginal bleeding for several months.  Most recently she has had 3 weeks of bleeding with plaque passing clots.  She is saturating about 4 tampons daily.  She complains now of feeling tired and weak at times.  The patient states that she has been seen by the local health department, she has been placed on Megace from July 2019 up until nail.  She has been seen by GYN, and there was a determination made to put her on a special type of intrauterine device, this device is very expensive and the patient has not been able to find resources to help her with pain for this as of yet.  She had an ultrasound while at the GYN office, and was told her uterus was enlarged.  The patient states she has been taking some iron products, but she is not been consistent with this. Patient was told to come to the emergency department if her symptoms changed.  The history is provided by the patient.  Vaginal Bleeding  Primary symptoms include vaginal bleeding.  Primary symptoms include no dysuria. There has been no fever. Pertinent negatives include no abdominal pain, no frequency and no dizziness.    Past Medical History:  Diagnosis Date  . Asthma   . Chronic headaches   . Hypertension     There are no active problems to display for this patient.   Past Surgical History:  Procedure Laterality Date  . EYE SURGERY       OB History    Gravida  0   Para  0   Term  0   Preterm      AB      Living        SAB      TAB      Ectopic      Multiple      Live Births               Home Medications      Prior to Admission medications   Medication Sig Start Date End Date Taking? Authorizing Provider  amLODipine (NORVASC) 10 MG tablet Take 1 tablet (10 mg total) by mouth daily. 09/18/18   Estill Dooms, NP  cyclobenzaprine (FLEXERIL) 10 MG tablet Take 1 tablet (10 mg total) by mouth 3 (three) times daily as needed. 10/20/18   Triplett, Tammy, PA-C  dicyclomine (BENTYL) 20 MG tablet Take 1 tablet (20 mg total) by mouth 2 (two) times daily. 10/07/18   Tasia Catchings, Amy V, PA-C  medroxyPROGESTERone (PROVERA) 10 MG tablet Take 10 mg by mouth See admin instructions. Take 10 mg by mouth two times a day until bleeding stops then take remaining tablets once a day until finished 06/26/18   [provider]  ondansetron (ZOFRAN ODT) 4 MG disintegrating tablet Take 1 tablet (4 mg total) by mouth every 8 (eight) hours as needed for nausea or vomiting. 10/07/18   Tasia Catchings, Amy V, PA-C  PROVENTIL HFA 108 (90 Base) MCG/ACT inhaler Inhale 1-2 puffs into the lungs every 6 (six) hours as needed for wheezing or  shortness of breath.  07/09/18   [provider]  traMADol (ULTRAM) 50 MG tablet Take 1 tablet (50 mg total) by mouth every 6 (six) hours as needed. 10/20/18   Kem Parkinson, PA-C    Family History Family History  Problem Relation Age of Onset  . Diabetes Other     Social History Social History   Tobacco Use  . Smoking status: Never Smoker  . Smokeless tobacco: Never Used  Substance Use Topics  . Alcohol use: Yes    Comment: occ  . Drug use: No     Allergies   Lisinopril and Tomato   Review of Systems Review of Systems  Constitutional: Positive for fatigue. Negative for activity change.       All ROS Neg except as noted in HPI  HENT: Negative for nosebleeds.   Eyes: Negative for photophobia and discharge.  Respiratory: Negative for cough, shortness of breath and wheezing.   Cardiovascular: Negative for chest pain and palpitations.  Gastrointestinal: Negative for abdominal pain  and blood in stool.  Genitourinary: Positive for vaginal bleeding. Negative for dysuria, frequency and hematuria.  Musculoskeletal: Negative for arthralgias, back pain and neck pain.  Skin: Negative.   Neurological: Negative for dizziness, seizures and speech difficulty.  Psychiatric/Behavioral: Negative for confusion and hallucinations.     Physical Exam Updated Vital Signs BP 124/81 (BP Location: Right Arm)   Pulse (!) 104   Temp 98.9 F (37.2 C) (Oral)   Resp 18   Wt 68 kg   LMP 10/05/2018   SpO2 100%   BMI 27.42 kg/m   Physical Exam  Constitutional: She is oriented to person, place, and time. She appears well-developed and well-nourished.  Non-toxic appearance.  HENT:  Head: Normocephalic.  Right Ear: Tympanic membrane and external ear normal.  Left Ear: Tympanic membrane and external ear normal.  Eyes: Pupils are equal, round, and reactive to light. EOM and lids are normal.  Neck: Normal range of motion. Neck supple. Carotid bruit is not present.  Cardiovascular: Normal rate, regular rhythm, normal heart sounds, intact distal pulses and normal pulses.  Pulmonary/Chest: Breath sounds normal. No respiratory distress.  Abdominal: Soft. Bowel sounds are normal. There is no tenderness. There is no guarding.  Musculoskeletal: Normal range of motion.  Lymphadenopathy:       Head (right side): No submandibular adenopathy present.       Head (left side): No submandibular adenopathy present.    She has no cervical adenopathy.  Neurological: She is alert and oriented to person, place, and time. She has normal strength. No cranial nerve deficit or sensory deficit.  Skin: Skin is warm and dry.  Psychiatric: She has a normal mood and affect. Her speech is normal.  Nursing note and vitals reviewed.    ED Treatments / Results  Labs (all labs ordered are listed, but only abnormal results are displayed) Labs Reviewed  CBC WITH DIFFERENTIAL/PLATELET  BASIC METABOLIC PANEL   HEPATIC FUNCTION PANEL  SAMPLE TO BLOOD BANK    EKG None  Radiology No results found.  Procedures Procedures (including critical care time)  Medications Ordered in ED Medications  HYDROcodone-acetaminophen (NORCO/VICODIN) 5-325 MG per tablet 2 tablet (2 tablets Oral Given 10/31/18 1839)  ondansetron (ZOFRAN) tablet 4 mg (4 mg Oral Given 10/31/18 1839)     Initial Impression / Assessment and Plan / ED Course  I have reviewed the triage vital signs and the nursing notes.  Pertinent labs & imaging results that were available during my  care of the patient were reviewed by me and considered in my medical decision making (see chart for details).       Final Clinical Impressions(s) / ED Diagnoses MDM  Patient is awake and alert.  In no distress at this time.  Vital signs reviewed.  No significant orthostatic changes noted on orthostatic check.  Patient states she is having significant amount of pain from her uterus area.  Patient treated with Norco.  Complete blood count is well within normal limits.  The hemoglobin is 13.9, the hematocrit is 42.7, platelets 262,000.  The basic metabolic panel shows the potassium to be slightly below the normal at 3.4, otherwise within normal limits.  The anion gap is normal at 9.  The hepatic functions study shows the protein to be slightly elevated at 8.4, otherwise all hepatic functions are within normal limits.  I discussed these findings with the patient in terms of which he understands.  I have asked the patient to discuss her continued bleeding with her GYN physician Dr. Glo Herring or a member of his team.  It is of note that the patient was seen earlier this month with a complaint of pain, and was treated for this.  I have asked the patient to address any additional pain management issue with her physicians at the health department or with her GYN physician.  Prescription for 12 tablets of Ultram given to the patient.  Questions were answered.   Feel that it is safe for the patient to be discharged home.   Final diagnoses:  Vaginal bleeding  Endometrial hyperplasia    ED Discharge Orders         Ordered    ibuprofen (ADVIL,MOTRIN) 400 MG tablet  4 times daily     10/31/18 2026    traMADol (ULTRAM) 50 MG tablet     10/31/18 2026           Lily Kocher, PA-C 10/31/18 2040    Dorie Rank, MD 10/31/18 2325

## 2018-10-31 NOTE — Discharge Instructions (Addendum)
Your vital signs have been reviewed.  Your lab work is negative for acute problems at this time.  Please increase fluids.  Please continue your iron as previously suggested.  Please continue your Megace as previously suggested.  Please use 400 mg of ibuprofen with breakfast, lunch, dinner, and at bedtime.  Use Ultram every 6 hours as needed for more severe pain. This medication may cause drowsiness. Please do not drink, drive, or participate in activity that requires concentration while taking this medication.  You were treated in the emergency department earlier this month for pain.  Please see your physicians at the health department or your GYN physician/Dr. Glo Herring for any additional pain management needs.  You may also want to discuss your continued bleeding with them.

## 2018-10-31 NOTE — ED Triage Notes (Addendum)
Pt reports vaginal bleeding and complains of uterus pain. Passing clots. Changing tampon at least 4 times per day. Reports bleeding for 2 months. Pt reports taking megace and bleeding seems to have gotten worse. Followed by [facility]

## 2018-11-01 LAB — SAMPLE TO BLOOD BANK

## 2018-11-04 ENCOUNTER — Encounter: Payer: Self-pay | Admitting: *Deleted

## 2018-11-04 ENCOUNTER — Ambulatory Visit: Payer: Self-pay | Admitting: Advanced Practice Midwife

## 2018-11-16 ENCOUNTER — Encounter (HOSPITAL_COMMUNITY): Payer: Self-pay | Admitting: *Deleted

## 2018-11-16 ENCOUNTER — Emergency Department (HOSPITAL_COMMUNITY): Payer: Self-pay

## 2018-11-16 ENCOUNTER — Emergency Department (HOSPITAL_COMMUNITY)
Admission: EM | Admit: 2018-11-16 | Discharge: 2018-11-17 | Disposition: A | Discharge status: Home / Self Care | Payer: Self-pay | Attending: Emergency Medicine | Admitting: Emergency Medicine

## 2018-11-16 DIAGNOSIS — E876 Hypokalemia: Secondary | ICD-10-CM | POA: Insufficient documentation

## 2018-11-16 DIAGNOSIS — Z79899 Other long term (current) drug therapy: Secondary | ICD-10-CM | POA: Insufficient documentation

## 2018-11-16 DIAGNOSIS — J45909 Unspecified asthma, uncomplicated: Secondary | ICD-10-CM | POA: Insufficient documentation

## 2018-11-16 DIAGNOSIS — Z720 Tobacco use: Secondary | ICD-10-CM

## 2018-11-16 DIAGNOSIS — R0789 Other chest pain: Secondary | ICD-10-CM | POA: Insufficient documentation

## 2018-11-16 DIAGNOSIS — R05 Cough: Secondary | ICD-10-CM | POA: Insufficient documentation

## 2018-11-16 DIAGNOSIS — R059 Cough, unspecified: Secondary | ICD-10-CM

## 2018-11-16 DIAGNOSIS — I10 Essential (primary) hypertension: Secondary | ICD-10-CM | POA: Insufficient documentation

## 2018-11-16 DIAGNOSIS — F17200 Nicotine dependence, unspecified, uncomplicated: Secondary | ICD-10-CM | POA: Insufficient documentation

## 2018-11-16 LAB — I-STAT TROPONIN, ED: Troponin i, poc: 0 ng/mL (ref 0.00–0.08)

## 2018-11-16 LAB — I-STAT BETA HCG BLOOD, ED (MC, WL, AP ONLY): I-stat hCG, quantitative: <5 m[IU]/mL (ref <5)

## 2018-11-16 NOTE — ED Triage Notes (Signed)
Pt is here for cough for a few days.  Pt report sharp chest pain with cough and deep breathing and certain movements.  Pt is alert and oriented.

## 2018-11-16 NOTE — ED Notes (Signed)
Spoke with Lab regarding pt blood that was sent down, lab is working on it

## 2018-11-16 NOTE — ED Notes (Signed)
PA at the bedside with pt

## 2018-11-16 NOTE — ED Notes (Signed)
Additional warm blankets given  

## 2018-11-16 NOTE — ED Notes (Signed)
Patient transported to X-ray 

## 2018-11-16 NOTE — ED Provider Notes (Signed)
[facility] EMERGENCY DEPARTMENT Provider Note   CSN: [id number] Arrival date & time: 11/16/18  2243     History   Chief Complaint Chief Complaint  Patient presents with  . Cough    HPI Barbara Lam is a 27 y.o. female with past medical history of hypertension, remote asthma, who presents today for evaluation of chest pain cough and shortness of breath.  She reports that for approximately 1 week she has been having mid substernal chest pain.  Her pain is only present when she coughs, moves, or takes a deep breath.  She reports that she has had a mild cold with slight nasal congestion.  She has had a productive cough with yellow sputum. She reports feeling short of breath when she walks.  She is taking megace for vaginal bleeding but reports it is not working.  She smokes one cigar a day.     She reports she does have an inhaler at home, has not needed to use it but hers is running low.     HPI  Past Medical History:  Diagnosis Date  . Asthma   . Chronic headaches   . Hypertension     There are no active problems to display for this patient.   Past Surgical History:  Procedure Laterality Date  . EYE SURGERY       OB History    Gravida  0   Para  0   Term  0   Preterm      AB      Living        SAB      TAB      Ectopic      Multiple      Live Births               Home Medications    Prior to Admission medications   Medication Sig Start Date End Date Taking? Authorizing Provider  albuterol (PROVENTIL HFA;VENTOLIN HFA) 108 (90 Base) MCG/ACT inhaler Inhale 2 puffs into the lungs every 4 (four) hours as needed for wheezing or shortness of breath. 11/17/18   Lorin Glass, PA-C  amLODipine (NORVASC) 10 MG tablet Take 1 tablet (10 mg total) by mouth daily. 09/18/18   Estill Dooms, NP  benzonatate (TESSALON) 100 MG capsule Take 1 capsule (100 mg total) by mouth at bedtime as needed for cough. 11/17/18   Lorin Glass, PA-C  cyclobenzaprine (FLEXERIL) 10 MG tablet Take 1 tablet (10 mg total) by mouth 3 (three) times daily as needed. 10/20/18   Triplett, Tammy, PA-C  dicyclomine (BENTYL) 20 MG tablet Take 1 tablet (20 mg total) by mouth 2 (two) times daily. 10/07/18   Tasia Catchings, Amy V, PA-C  ibuprofen (ADVIL,MOTRIN) 400 MG tablet Take 1 tablet (400 mg total) by mouth 4 (four) times daily. 10/31/18   Lily Kocher, PA-C  medroxyPROGESTERone (PROVERA) 10 MG tablet Take 10 mg by mouth See admin instructions. Take 10 mg by mouth two times a day until bleeding stops then take remaining tablets once a day until finished 06/26/18   [provider]  ondansetron (ZOFRAN ODT) 4 MG disintegrating tablet Take 1 tablet (4 mg total) by mouth every 8 (eight) hours as needed for nausea or vomiting. 10/07/18   Ok Edwards, PA-C  traMADol Veatrice Bourbon) 50 MG tablet 1 or 2 po q6h prn pain 10/31/18   Lily Kocher, PA-C    Family History Family History  Problem Relation Age  of Onset  . Diabetes Other     Social History Social History   Tobacco Use  . Smoking status: Never Smoker  . Smokeless tobacco: Never Used  Substance Use Topics  . Alcohol use: Yes    Comment: occ  . Drug use: No     Allergies   Lisinopril and Tomato   Review of Systems Review of Systems  Constitutional: Negative for chills, fatigue and fever.  HENT: Positive for congestion.   Respiratory: Positive for cough and shortness of breath.   Cardiovascular: Positive for chest pain. Negative for palpitations and leg swelling.  Gastrointestinal: Negative for abdominal pain, diarrhea, nausea and vomiting.  Genitourinary: Positive for vaginal bleeding (Unchanged, followed by OB/GYN).  Skin: Negative for color change and rash.  Neurological: Negative for weakness, light-headedness and headaches.  All other systems reviewed and are negative.    Physical Exam Updated Vital Signs BP 93/70   Pulse 98   Temp 99.1 F (37.3 C) (Oral)   Resp (!) 21    Ht 5\' 2"  (1.575 m)   Wt 68 kg   SpO2 100%   BMI 27.44 kg/m   Physical Exam  Constitutional: She is oriented to person, place, and time. She appears well-developed and well-nourished. No distress.  HENT:  Head: Normocephalic and atraumatic.  Mouth/Throat: Oropharynx is clear and moist. No oropharyngeal exudate.  Eyes: Conjunctivae are normal.  Neck: Normal range of motion. Neck supple.  Cardiovascular: Normal rate, regular rhythm, normal heart sounds and intact distal pulses.  No murmur heard. Pulmonary/Chest: Effort normal and breath sounds normal. No respiratory distress. She exhibits no tenderness.  Anterior chest is TTP over lower sterno-costal joints.    Abdominal: Soft. Bowel sounds are normal. She exhibits no distension. There is no tenderness. There is no guarding.  Musculoskeletal: She exhibits no edema.  No edema to BLE.    Lymphadenopathy:    She has no cervical adenopathy.  Neurological: She is alert and oriented to person, place, and time.  Skin: Skin is warm and dry.  Psychiatric: She has a normal mood and affect.  Nursing note and vitals reviewed.    ED Treatments / Results  Labs (all labs ordered are listed, but only abnormal results are displayed) Labs Reviewed  COMPREHENSIVE METABOLIC PANEL - Abnormal; Notable for the following components:      Result Value   Sodium 134 (*)    Potassium 3.2 (*)    All other components within normal limits  CBC WITH DIFFERENTIAL/PLATELET  D-DIMER, QUANTITATIVE (NOT AT [facility])  I-STAT TROPONIN, ED  I-STAT BETA HCG BLOOD, ED ([facility], [facility], [facility] ONLY)    EKG EKG Interpretation  Date/Time:  Sunday November 16 2018 22:56:14 EST Ventricular Rate:  95 PR Interval:    QRS Duration: 83 QT Interval:  317 QTC Calculation: 399 R Axis:   76 Text Interpretation:  Sinus rhythm nonspecific t wave changes on previous ekg have resolved Confirmed by Pryor Curia [phone]) on 11/17/2018 12:23:40 AM   Radiology Dg Chest 2 View  Result Date:  11/16/2018 CLINICAL DATA:  Cough. EXAM: CHEST - 2 VIEW COMPARISON:  September 28, 2018 FINDINGS: The heart, hila, mediastinum, and pleura are normal. No suspicious infiltrate identified. IMPRESSION: No focal infiltrate noted. Electronically Signed   By: Dorise Bullion III M.D   On: 11/16/2018 23:24    Procedures Procedures (including critical care time)  Medications Ordered in ED Medications  ibuprofen (ADVIL,MOTRIN) tablet 600 mg (600 mg Oral Given 11/17/18 0118)  albuterol (PROVENTIL  HFA;VENTOLIN HFA) 108 (90 Base) MCG/ACT inhaler 2 puff (2 puffs Inhalation Given 11/17/18 0118)     Initial Impression / Assessment and Plan / ED Course  I have reviewed the triage vital signs and the nursing notes.  Pertinent labs & imaging results that were available during my care of the patient were reviewed by me and considered in my medical decision making (see chart for details).  Clinical Course as of Nov 17 138  Mon Nov 17, 2018  0012 Not elevated  D-Dimer, Quant: 0.27 [EH]  0013 Not elevated  Troponin i, poc: 0.00 [EH]  0013 Not anemic  Hemoglobin: 13.2 [EH]    Clinical Course User Index [EH] Lorin Glass, PA-C   Bonney Aid presents today for evaluaiton of chest pain and shortness of breath for one week.  Her pain is pleuritic in nature, worse with positional changes, movement and coughing.  She had recent URI type symptoms.  Unable to apply [facility] critiera as patient is on megace by OB/GYN.  D-dimer is not elevated.  EKG without acute abnormalities.  Chest x-ray does not show evidence of pneumonia, pneumothorax, or other abnormality.    Labs reviewed, patient is not anemic.  Her troponin is not elevated.  Heartscore 1, do not suspect ACS given pain for one week with normal EKG and troponin.  Suspect her pain is MSK related due to cough, TTP of anterior chest wall.  Recommended conservative measures, OTC pain medications.  Given albuterol inhaler while in the department.    Potassium  mildly low.  Recommenced high potassium foods, PCP follow up.   Return precautions were discussed with patient who states their understanding.  At the time of discharge patient denied any unaddressed complaints or concerns.  Patient is agreeable for discharge home.   Final Clinical Impressions(s) / ED Diagnoses   Final diagnoses:  Atypical chest pain  Cough  Hypokalemia  Tobacco use    ED Discharge Orders         Ordered    benzonatate (TESSALON) 100 MG capsule  At bedtime PRN     11/17/18 0022    albuterol (PROVENTIL HFA;VENTOLIN HFA) 108 (90 Base) MCG/ACT inhaler  Every 4 hours PRN     11/17/18 0022           Lorin Glass, PA-C 11/17/18 0141    Ward, Delice Bison, DO 11/17/18 0149

## 2018-11-17 LAB — CBC WITH DIFFERENTIAL/PLATELET
ABS IMMATURE GRANULOCYTES: 0.02 10*3/uL (ref 0.00–0.07)
Basophils Absolute: 0 10*3/uL (ref 0.0–0.1)
Basophils Relative: 1 %
Eosinophils Absolute: 0.2 10*3/uL (ref 0.0–0.5)
Eosinophils Relative: 2 %
HCT: 39.8 % (ref 36.0–46.0)
HEMOGLOBIN: 13.2 g/dL (ref 12.0–15.0)
Immature Granulocytes: 0 %
LYMPHS PCT: 51 %
Lymphs Abs: 3.4 10*3/uL (ref 0.7–4.0)
MCH: 32 pg (ref 26.0–34.0)
MCHC: 33.2 g/dL (ref 30.0–36.0)
MCV: 96.6 fL (ref 80.0–100.0)
Monocytes Absolute: 0.6 10*3/uL (ref 0.1–1.0)
Monocytes Relative: 9 %
Neutro Abs: 2.5 10*3/uL (ref 1.7–7.7)
Neutrophils Relative %: 37 %
Platelets: 234 10*3/uL (ref 150–400)
RBC: 4.12 MIL/uL (ref 3.87–5.11)
RDW: 12.5 % (ref 11.5–15.5)
WBC: 6.7 10*3/uL (ref 4.0–10.5)
nRBC: 0 % (ref 0.0–0.2)

## 2018-11-17 LAB — COMPREHENSIVE METABOLIC PANEL
ALT: 17 U/L (ref 0–44)
AST: 18 U/L (ref 15–41)
Albumin: 3.7 g/dL (ref 3.5–5.0)
Alkaline Phosphatase: 53 U/L (ref 38–126)
Anion gap: 7 (ref 5–15)
BUN: 12 mg/dL (ref 6–20)
CO2: 22 mmol/L (ref 22–32)
Calcium: 8.9 mg/dL (ref 8.9–10.3)
Chloride: 105 mmol/L (ref 98–111)
Creatinine, Ser: 0.83 mg/dL (ref 0.44–1.00)
GFR calc Af Amer: >60 mL/min (ref >60)
GFR calc non Af Amer: >60 mL/min (ref >60)
GLUCOSE: 88 mg/dL (ref 70–99)
Potassium: 3.2 mmol/L — ABNORMAL LOW (ref 3.5–5.1)
SODIUM: 134 mmol/L — AB (ref 135–145)
Total Bilirubin: 0.5 mg/dL (ref 0.3–1.2)
Total Protein: 7.6 g/dL (ref 6.5–8.1)

## 2018-11-17 LAB — D-DIMER, QUANTITATIVE: D-Dimer, Quant: 0.27 ug/mL-FEU (ref 0.00–0.50)

## 2018-11-17 MED ORDER — IBUPROFEN 400 MG PO TABS
600.0000 mg | ORAL_TABLET | Freq: Once | ORAL | Status: AC
  Administered 2018-11-17: 600 mg via ORAL
  Filled 2018-11-17: qty 1

## 2018-11-17 MED ORDER — ALBUTEROL SULFATE HFA 108 (90 BASE) MCG/ACT IN AERS: 2.0000 | INHALATION_SPRAY | RESPIRATORY_TRACT | 1 refills | Status: DC | PRN

## 2018-11-17 MED ORDER — BENZONATATE 100 MG PO CAPS: 100.0000 mg | ORAL_CAPSULE | Freq: Every evening | ORAL | 0 refills | Status: DC | PRN

## 2018-11-17 MED ORDER — ALBUTEROL SULFATE HFA 108 (90 BASE) MCG/ACT IN AERS
2.0000 | INHALATION_SPRAY | Freq: Once | RESPIRATORY_TRACT | Status: AC
  Administered 2018-11-17: 2 via RESPIRATORY_TRACT
  Filled 2018-11-17: qty 6.7

## 2018-11-17 NOTE — Discharge Instructions (Addendum)
Please completely stop smoking.  When you smoke your lungs are not able to get the mucus out and you are more likely to get pneumonia.  Please take Ibuprofen (Advil, motrin) and Tylenol (acetaminophen) to relieve your pain.  You may take up to 600 MG (3 pills) of normal strength ibuprofen every 8 hours as needed.  In between doses of ibuprofen you make take tylenol, up to 1,000 mg (two extra strength pills).  Do not take more than 3,000 mg tylenol in a 24 hour period.  Please check all medication labels as many medications such as pain and cold medications may contain tylenol.  Do not drink alcohol while taking these medications.  Do not take other NSAID'S while taking ibuprofen (such as aleve or naproxen).  Please take ibuprofen with food to decrease stomach upset.

## 2018-11-25 ENCOUNTER — Ambulatory Visit (HOSPITAL_COMMUNITY)
Admission: EM | Admit: 2018-11-25 | Discharge: 2018-11-25 | Disposition: A | Discharge status: Home / Self Care | Payer: Self-pay | Attending: Physician Assistant | Admitting: Physician Assistant

## 2018-11-25 ENCOUNTER — Encounter (HOSPITAL_COMMUNITY): Payer: Self-pay | Admitting: Emergency Medicine

## 2018-11-25 DIAGNOSIS — R112 Nausea with vomiting, unspecified: Secondary | ICD-10-CM | POA: Insufficient documentation

## 2018-11-25 DIAGNOSIS — R1032 Left lower quadrant pain: Secondary | ICD-10-CM | POA: Insufficient documentation

## 2018-11-25 DIAGNOSIS — R197 Diarrhea, unspecified: Secondary | ICD-10-CM | POA: Insufficient documentation

## 2018-11-25 MED ORDER — DICYCLOMINE HCL 20 MG PO TABS: 20.0000 mg | ORAL_TABLET | Freq: Two times a day (BID) | ORAL | 0 refills | Status: DC

## 2018-11-25 NOTE — ED Triage Notes (Signed)
PT reports LLQ pain with diarrhea and nausea for 2 days. PT has taken zofran.

## 2018-11-25 NOTE — ED Provider Notes (Signed)
[facility]    CSN: [id number] Arrival date & time: 11/25/18  1350     History   Chief Complaint Chief Complaint  Patient presents with  . Abdominal Pain    HPI Barbara Lam is a 27 y.o. female.   27 year old female comes in for 2-day history of left lower quadrant pain, diarrhea, nausea.  States left lower quadrant pain has been intermittent, slowly worsening.  No obvious aggravating or alleviating factor.  She has avoided eating due to nausea.  Has had 4-5 episodes of loose/watery stools.  Denies melena, hematochezia.  She took Zofran with good relief of her nausea.  She is also had some URI symptoms such as cough, rhinorrhea, nasal congestion.  She is worried about her appendix.     Past Medical History:  Diagnosis Date  . Asthma   . Chronic headaches   . Hypertension     There are no active problems to display for this patient.   Past Surgical History:  Procedure Laterality Date  . EYE SURGERY      OB History    Gravida  0   Para  0   Term  0   Preterm      AB      Living        SAB      TAB      Ectopic      Multiple      Live Births               Home Medications    Prior to Admission medications   Medication Sig Start Date End Date Taking? Authorizing Provider  amLODipine (NORVASC) 10 MG tablet Take 1 tablet (10 mg total) by mouth daily. 09/18/18  Yes Derrek Monaco A, NP  medroxyPROGESTERone (PROVERA) 10 MG tablet Take 10 mg by mouth See admin instructions. Take 10 mg by mouth two times a day until bleeding stops then take remaining tablets once a day until finished 06/26/18  Yes [provider]  ondansetron (ZOFRAN ODT) 4 MG disintegrating tablet Take 1 tablet (4 mg total) by mouth every 8 (eight) hours as needed for nausea or vomiting. 10/07/18  Yes Lovely Kerins V, PA-C  albuterol (PROVENTIL HFA;VENTOLIN HFA) 108 (90 Base) MCG/ACT inhaler Inhale 2 puffs into the lungs every 4 (four) hours as needed for wheezing  or shortness of breath. 11/17/18   Lorin Glass, PA-C  benzonatate (TESSALON) 100 MG capsule Take 1 capsule (100 mg total) by mouth at bedtime as needed for cough. 11/17/18   Lorin Glass, PA-C  cyclobenzaprine (FLEXERIL) 10 MG tablet Take 1 tablet (10 mg total) by mouth 3 (three) times daily as needed. 10/20/18   Triplett, Tammy, PA-C  dicyclomine (BENTYL) 20 MG tablet Take 1 tablet (20 mg total) by mouth 2 (two) times daily. 11/25/18   Tasia Catchings, Yaslene Lindamood V, PA-C  ibuprofen (ADVIL,MOTRIN) 400 MG tablet Take 1 tablet (400 mg total) by mouth 4 (four) times daily. 10/31/18   Lily Kocher, PA-C  traMADol Veatrice Bourbon) 50 MG tablet 1 or 2 po q6h prn pain 10/31/18   Lily Kocher, PA-C    Family History Family History  Problem Relation Age of Onset  . Diabetes Other     Social History Social History   Tobacco Use  . Smoking status: Never Smoker  . Smokeless tobacco: Never Used  Substance Use Topics  . Alcohol use: Yes    Comment: occ  . Drug use: No  Allergies   Lisinopril and Tomato   Review of Systems Review of Systems  Reason unable to perform ROS: See HPI as above.     Physical Exam Triage Vital Signs ED Triage Vitals [11/25/18 1438]  Enc Vitals Group     BP 121/82     Pulse Rate 82     Resp 16     Temp 98.4 F (36.9 C)     Temp Source Oral     SpO2 100 %     Weight      Height      Head Circumference      Peak Flow      Pain Score 8     Pain Loc      Pain Edu?      Excl. in [place]?    No data found.  Updated Vital Signs BP 121/82   Pulse 82   Temp 98.4 F (36.9 C) (Oral)   Resp 16   LMP 11/19/2018   SpO2 100%   Visual Acuity Right Eye Distance:   Left Eye Distance:   Bilateral Distance:    Right Eye Near:   Left Eye Near:    Bilateral Near:     Physical Exam  Constitutional: She is oriented to person, place, and time. She appears well-developed and well-nourished.  Non-toxic appearance. She does not appear ill. No distress.  HENT:  Head:  Normocephalic and atraumatic.  Right Ear: Tympanic membrane, external ear and ear canal normal. Tympanic membrane is not erythematous and not bulging.  Left Ear: Tympanic membrane, external ear and ear canal normal. Tympanic membrane is not erythematous and not bulging.  Nose: Nose normal. Right sinus exhibits no maxillary sinus tenderness and no frontal sinus tenderness. Left sinus exhibits no maxillary sinus tenderness and no frontal sinus tenderness.  Mouth/Throat: Uvula is midline, oropharynx is clear and moist and mucous membranes are normal.  Eyes: Pupils are equal, round, and reactive to light. Conjunctivae are normal.  Neck: Normal range of motion. Neck supple.  Cardiovascular: Normal rate, regular rhythm and normal heart sounds. Exam reveals no gallop and no friction rub.  No murmur heard. Pulmonary/Chest: Effort normal and breath sounds normal. She has no decreased breath sounds. She has no wheezes. She has no rhonchi. She has no rales.  Abdominal: Soft. She exhibits no mass. Bowel sounds are increased. There is no tenderness. There is no rigidity, no rebound, no guarding, no CVA tenderness and no tenderness at McBurney's point.  Lymphadenopathy:    She has no cervical adenopathy.  Neurological: She is alert and oriented to person, place, and time.  Skin: Skin is warm and dry.  Psychiatric: She has a normal mood and affect. Her behavior is normal. Judgment normal.     UC Treatments / Results  Labs (all labs ordered are listed, but only abnormal results are displayed) Labs Reviewed - No data to display  EKG None  Radiology No results found.  Procedures Procedures (including critical care time)  Medications Ordered in UC Medications - No data to display  Initial Impression / Assessment and Plan / UC Course  I have reviewed the triage vital signs and the nursing notes.  Pertinent labs & imaging results that were available during my care of the patient were reviewed by me  and considered in my medical decision making (see chart for details).    No alarming signs on exam.  Patient abdomen nontender to palpation.  Specifically nontender at McBurney's point.  Reassurance provided.  Patient to continue Zofran for nausea/vomiting.  Bentyl for abdominal cramping.  Push fluids.  Bland diet, advance as tolerated.  Return precautions given.  Patient expresses understanding and agrees to plan.  Final Clinical Impressions(s) / UC Diagnoses   Final diagnoses:  Left lower quadrant abdominal pain  Nausea vomiting and diarrhea    ED Prescriptions    Medication Sig Dispense Auth. Provider   dicyclomine (BENTYL) 20 MG tablet Take 1 tablet (20 mg total) by mouth 2 (two) times daily. 20 tablet Tobin Chad, [place] 11/25/18 1913

## 2018-11-25 NOTE — Discharge Instructions (Signed)
You can continue Zofran for nausea and vomiting as needed. Bentyl as directed. Keep hydrated, you urine should be clear to pale yellow in color. Bland diet, advance as tolerated. Monitor for any worsening of symptoms, nausea or vomiting not controlled by medication, worsening abdominal pain, fever, cannot walk/jump due to pain, go to the emergency department for further evaluation needed.

## 2018-12-09 ENCOUNTER — Encounter (HOSPITAL_COMMUNITY): Payer: Self-pay | Admitting: Emergency Medicine

## 2018-12-09 ENCOUNTER — Emergency Department (HOSPITAL_COMMUNITY)
Admission: EM | Admit: 2018-12-09 | Discharge: 2018-12-10 | Disposition: A | Discharge status: Home / Self Care | Payer: Self-pay | Attending: Emergency Medicine | Admitting: Emergency Medicine

## 2018-12-09 DIAGNOSIS — R197 Diarrhea, unspecified: Secondary | ICD-10-CM | POA: Insufficient documentation

## 2018-12-09 DIAGNOSIS — R112 Nausea with vomiting, unspecified: Secondary | ICD-10-CM | POA: Insufficient documentation

## 2018-12-09 DIAGNOSIS — D259 Leiomyoma of uterus, unspecified: Secondary | ICD-10-CM | POA: Insufficient documentation

## 2018-12-09 DIAGNOSIS — I10 Essential (primary) hypertension: Secondary | ICD-10-CM | POA: Insufficient documentation

## 2018-12-09 DIAGNOSIS — R1031 Right lower quadrant pain: Secondary | ICD-10-CM | POA: Insufficient documentation

## 2018-12-09 NOTE — ED Triage Notes (Signed)
Pt reports RLQ pain, N/V/D X few days.

## 2018-12-09 NOTE — ED Provider Notes (Signed)
[facility] EMERGENCY DEPARTMENT Provider Note   CSN: [id number] Arrival date & time: 12/09/18  2250     History   Chief Complaint Chief Complaint  Patient presents with  . Abdominal Pain    HPI Barbara Lam is a 27 y.o. female with a hx of asthma, chronic headache, HTN presents to the Emergency Department complaining of gradual, persistent, progressively worsening lower abdominal pain onset 1.5 weeks ago.  Patient reports that her pain initially began in the left lower quadrant but 5 days ago the pain moved to her right lower quadrant.  Patient denies a history of abdominal surgeries.  She reports she is sexually active with one female however her last sexual encounter was in October 2019.  She denies vaginal discharge, history of PID or vaginal pain.  Patient reports she is completing her menstrual cycle at this time.  She states movement and walking make her pain worse.  Lying makes it better.  She denies fever, chills, headache, neck pain, chest pain, shortness of breath, weakness, dizziness, syncope.  Patient reports severe abdominal pain with urination but no specific dysuria, frequency or urgency.  Patient also reports watery diarrhea for the last 7 days.  She has had intermittent episodes of vomiting including this morning.  No melena or hematochezia.  Emesis is nonbloody nonbilious.   The history is provided by the patient and medical records. No language interpreter was used.    Past Medical History:  Diagnosis Date  . Asthma   . Chronic headaches   . Hypertension     There are no active problems to display for this patient.   Past Surgical History:  Procedure Laterality Date  . EYE SURGERY       OB History    Gravida  0   Para  0   Term  0   Preterm      AB      Living        SAB      TAB      Ectopic      Multiple      Live Births               Home Medications    Prior to Admission medications   Medication Sig  Start Date End Date Taking? Authorizing Provider  albuterol (PROVENTIL HFA;VENTOLIN HFA) 108 (90 Base) MCG/ACT inhaler Inhale 2 puffs into the lungs every 4 (four) hours as needed for wheezing or shortness of breath. 11/17/18   Lorin Glass, PA-C  amLODipine (NORVASC) 10 MG tablet Take 1 tablet (10 mg total) by mouth daily. 09/18/18   Estill Dooms, NP  benzonatate (TESSALON) 100 MG capsule Take 1 capsule (100 mg total) by mouth at bedtime as needed for cough. 11/17/18   Lorin Glass, PA-C  cyclobenzaprine (FLEXERIL) 10 MG tablet Take 1 tablet (10 mg total) by mouth 3 (three) times daily as needed. 10/20/18   Triplett, Tammy, PA-C  dicyclomine (BENTYL) 20 MG tablet Take 1 tablet (20 mg total) by mouth 2 (two) times daily. 11/25/18   Tasia Catchings, Amy V, PA-C  ibuprofen (ADVIL,MOTRIN) 400 MG tablet Take 1 tablet (400 mg total) by mouth 4 (four) times daily. 10/31/18   Lily Kocher, PA-C  medroxyPROGESTERone (PROVERA) 10 MG tablet Take 10 mg by mouth See admin instructions. Take 10 mg by mouth two times a day until bleeding stops then take remaining tablets once a day until finished 06/26/18   [provider]  ondansetron (ZOFRAN ODT) 4 MG disintegrating tablet 4mg  ODT q4 hours prn nausea/vomit 12/10/18   Yuleni Burich, Jarrett Soho, PA-C  traMADol (ULTRAM) 50 MG tablet 1 or 2 po q6h prn pain 10/31/18   Lily Kocher, PA-C    Family History Family History  Problem Relation Age of Onset  . Diabetes Other     Social History Social History   Tobacco Use  . Smoking status: Never Smoker  . Smokeless tobacco: Never Used  Substance Use Topics  . Alcohol use: Yes    Comment: Socially   . Drug use: No     Allergies   Lisinopril and Tomato   Review of Systems Review of Systems  Constitutional: Negative for appetite change, diaphoresis, fatigue, fever and unexpected weight change.  HENT: Negative for mouth sores.   Eyes: Negative for visual disturbance.  Respiratory: Negative  for cough, chest tightness, shortness of breath and wheezing.   Cardiovascular: Negative for chest pain.  Gastrointestinal: Positive for abdominal pain, diarrhea, nausea and vomiting. Negative for constipation.  Endocrine: Negative for polydipsia, polyphagia and polyuria.  Genitourinary: Negative for dysuria, frequency, hematuria and urgency.  Musculoskeletal: Negative for back pain and neck stiffness.  Skin: Negative for rash.  Allergic/Immunologic: Negative for immunocompromised state.  Neurological: Negative for syncope, light-headedness and headaches.  Hematological: Does not bruise/bleed easily.  Psychiatric/Behavioral: Negative for sleep disturbance. The patient is not nervous/anxious.      Physical Exam Updated Vital Signs BP (!) 136/91 (BP Location: Right Arm)   Pulse 95   Temp 98.2 F (36.8 C) (Oral)   Resp 18   Ht 5\' 2"  (1.575 m)   Wt 68 kg   LMP 11/19/2018   SpO2 100%   BMI 27.44 kg/m   Physical Exam Vitals signs and nursing note reviewed.  Constitutional:      General: She is not in acute distress.    Appearance: She is well-developed. She is not diaphoretic.     Comments: Awake, alert, nontoxic appearance  HENT:     Head: Normocephalic and atraumatic.     Mouth/Throat:     Pharynx: No oropharyngeal exudate.  Eyes:     General: No scleral icterus.    Conjunctiva/sclera: Conjunctivae normal.  Neck:     Musculoskeletal: Normal range of motion and neck supple.  Cardiovascular:     Rate and Rhythm: Normal rate and regular rhythm.  Pulmonary:     Effort: Pulmonary effort is normal. No respiratory distress.     Breath sounds: Normal breath sounds. No wheezing.  Abdominal:     General: Bowel sounds are normal.     Palpations: Abdomen is soft. There is no mass.     Tenderness: There is abdominal tenderness in the right lower quadrant. There is guarding. There is no right CVA tenderness, left CVA tenderness or rebound. McBurney's point: mild.  Musculoskeletal:  Normal range of motion.  Skin:    General: Skin is warm and dry.  Neurological:     Mental Status: She is alert.     Comments: Speech is clear and goal oriented Moves extremities without ataxia      ED Treatments / Results  Labs (all labs ordered are listed, but only abnormal results are displayed) Labs Reviewed  CBC WITH DIFFERENTIAL/PLATELET - Abnormal; Notable for the following components:      Result Value   RBC 3.86 (*)    All other components within normal limits  URINALYSIS, ROUTINE W REFLEX MICROSCOPIC - Abnormal; Notable for the following  components:   Hgb urine dipstick MODERATE (*)    Bacteria, UA RARE (*)    All other components within normal limits  COMPREHENSIVE METABOLIC PANEL - Abnormal; Notable for the following components:   Potassium 5.4 (*)    Calcium 8.3 (*)    Total Protein 6.1 (*)    Albumin 3.1 (*)    All other components within normal limits  HCG, QUANTITATIVE, PREGNANCY  LIPASE, BLOOD     Radiology US Transvaginal Non-ob  Result Date: 12/10/2018 CLINICAL DATA:  Acute onset of right lower quadrant abdominal pain. EXAM: TRANSABDOMINAL AND TRANSVAGINAL ULTRASOUND OF PELVIS DOPPLER ULTRASOUND OF OVARIES TECHNIQUE: Both transabdominal and transvaginal ultrasound examinations of the pelvis were performed. Transabdominal technique was performed for global imaging of the pelvis including uterus, ovaries, adnexal regions, and pelvic cul-de-sac. It was necessary to proceed with endovaginal exam following the transabdominal exam to visualize the endometrium and ovaries in greater detail. Color and duplex Doppler ultrasound was utilized to evaluate blood flow to the ovaries. COMPARISON:  CT of the abdomen and pelvis performed earlier today at 2:08 a.m. FINDINGS: Uterus Measurements: 10.0 x 4.4 x 5.9 cm = volume: 138.4 mL. A 3.6 cm vaguely heterogeneous mass at the uterine fundus likely reflects a fibroid, though difficult to fully characterize. Endometrium  Thickness: 0.5 cm.  No focal abnormality visualized. Right ovary Measurements: 3.6 x 2.0 x 1.6 cm = volume: 6.6 mL. Normal appearance/no adnexal mass. Left ovary Measurements: 3.1 x 1.4 x 2.6 cm = volume: 6.5 mL. Normal appearance/no adnexal mass. Pulsed Doppler evaluation of both ovaries demonstrates normal low-resistance arterial and venous waveforms. Other findings Trace free fluid is seen within the pelvic cul-de-sac. IMPRESSION: 1. Likely uterine fibroid noted at the uterine fundus. 2. No evidence for ovarian torsion. Electronically Signed   By: Garald Balding M.D.   On: 12/10/2018 04:32   US Pelvis Complete  Result Date: 12/10/2018 CLINICAL DATA:  Acute onset of right lower quadrant abdominal pain. EXAM: TRANSABDOMINAL AND TRANSVAGINAL ULTRASOUND OF PELVIS DOPPLER ULTRASOUND OF OVARIES TECHNIQUE: Both transabdominal and transvaginal ultrasound examinations of the pelvis were performed. Transabdominal technique was performed for global imaging of the pelvis including uterus, ovaries, adnexal regions, and pelvic cul-de-sac. It was necessary to proceed with endovaginal exam following the transabdominal exam to visualize the endometrium and ovaries in greater detail. Color and duplex Doppler ultrasound was utilized to evaluate blood flow to the ovaries. COMPARISON:  CT of the abdomen and pelvis performed earlier today at 2:08 a.m. FINDINGS: Uterus Measurements: 10.0 x 4.4 x 5.9 cm = volume: 138.4 mL. A 3.6 cm vaguely heterogeneous mass at the uterine fundus likely reflects a fibroid, though difficult to fully characterize. Endometrium Thickness: 0.5 cm.  No focal abnormality visualized. Right ovary Measurements: 3.6 x 2.0 x 1.6 cm = volume: 6.6 mL. Normal appearance/no adnexal mass. Left ovary Measurements: 3.1 x 1.4 x 2.6 cm = volume: 6.5 mL. Normal appearance/no adnexal mass. Pulsed Doppler evaluation of both ovaries demonstrates normal low-resistance arterial and venous waveforms. Other findings Trace free  fluid is seen within the pelvic cul-de-sac. IMPRESSION: 1. Likely uterine fibroid noted at the uterine fundus. 2. No evidence for ovarian torsion. Electronically Signed   By: Garald Balding M.D.   On: 12/10/2018 04:32   Ct Abdomen Pelvis W Contrast  Result Date: 12/10/2018 CLINICAL DATA:  Right lower quadrant pain EXAM: CT ABDOMEN AND PELVIS WITH CONTRAST TECHNIQUE: Multidetector CT imaging of the abdomen and pelvis was performed using the standard protocol following bolus  administration of intravenous contrast. CONTRAST:  189mL OMNIPAQUE IOHEXOL 300 MG/ML  SOLN COMPARISON:  03/22/2018 FINDINGS: Lower chest: No acute abnormality. Hepatobiliary: No focal liver abnormality is seen. No gallstones, gallbladder wall thickening, or biliary dilatation. Pancreas: Unremarkable. No pancreatic ductal dilatation or surrounding inflammatory changes. Spleen: Normal in size without focal abnormality. Adrenals/Urinary Tract: The adrenal glands are within normal limits bilaterally. Kidneys are well visualize without renal calculi or obstructive changes. Bladder is decompressed. Stomach/Bowel: The appendix is not well visualized. No inflammatory changes to suggest appendicitis are seen. No obstructive or inflammatory changes of the bowel are seen. Vascular/Lymphatic: No significant vascular findings are present. No enlarged abdominal or pelvic lymph nodes. Reproductive: Uterus is within normal limits. Ovaries are stable in appearance from the prior exam. Other: No abdominal wall hernia or abnormality. No abdominopelvic ascites. Musculoskeletal: No acute or significant osseous findings. IMPRESSION: No acute abnormality noted. Electronically Signed   By: Inez Catalina M.D.   On: 12/10/2018 02:21   [place] Art/ven Flow Abd Pelv Doppler  Result Date: 12/10/2018 CLINICAL DATA:  Acute onset of right lower quadrant abdominal pain. EXAM: TRANSABDOMINAL AND TRANSVAGINAL ULTRASOUND OF PELVIS DOPPLER ULTRASOUND OF OVARIES TECHNIQUE: Both  transabdominal and transvaginal ultrasound examinations of the pelvis were performed. Transabdominal technique was performed for global imaging of the pelvis including uterus, ovaries, adnexal regions, and pelvic cul-de-sac. It was necessary to proceed with endovaginal exam following the transabdominal exam to visualize the endometrium and ovaries in greater detail. Color and duplex Doppler ultrasound was utilized to evaluate blood flow to the ovaries. COMPARISON:  CT of the abdomen and pelvis performed earlier today at 2:08 a.m. FINDINGS: Uterus Measurements: 10.0 x 4.4 x 5.9 cm = volume: 138.4 mL. A 3.6 cm vaguely heterogeneous mass at the uterine fundus likely reflects a fibroid, though difficult to fully characterize. Endometrium Thickness: 0.5 cm.  No focal abnormality visualized. Right ovary Measurements: 3.6 x 2.0 x 1.6 cm = volume: 6.6 mL. Normal appearance/no adnexal mass. Left ovary Measurements: 3.1 x 1.4 x 2.6 cm = volume: 6.5 mL. Normal appearance/no adnexal mass. Pulsed Doppler evaluation of both ovaries demonstrates normal low-resistance arterial and venous waveforms. Other findings Trace free fluid is seen within the pelvic cul-de-sac. IMPRESSION: 1. Likely uterine fibroid noted at the uterine fundus. 2. No evidence for ovarian torsion. Electronically Signed   By: Garald Balding M.D.   On: 12/10/2018 04:32    Procedures Procedures (including critical care time)  Medications Ordered in ED Medications  morphine 4 MG/ML injection 4 mg (4 mg Intravenous Given 12/10/18 0140)  iohexol (OMNIPAQUE) 300 MG/ML solution 100 mL (100 mLs Intravenous Contrast Given 12/10/18 0202)  sodium chloride 0.9 % bolus 1,000 mL (1,000 mLs Intravenous New Bag/Given 12/10/18 0300)     Initial Impression / Assessment and Plan / ED Course  I have reviewed the triage vital signs and the nursing notes.  Pertinent labs & imaging results that were available during my care of the patient were reviewed by me and  considered in my medical decision making (see chart for details).  Clinical Course as of Dec 10 499  Wed Dec 10, 2018  0136 Pt with increasing pain.  Morphine ordered   [HM]  1950 Pt refusing pelvic at this time.  She reports she will see her OB/GYN for this.  She understands that her pain is possibly from a pelvic infection and this cannot be ruled out without a pelvic exam.     [HM]    Clinical Course User  Index [HM] Darian Ace, Jarrett Soho, PA-C    Presents with lower abdominal pain that has been ongoing for several weeks initially in the left now on the right.  Labs are generally reassuring.  Patient is noted to have hyperkalemia however lab notes that the sample is hemolyzed.  Patient adamantly refuse lab redraw and RN is unable to draw back on her IV.  She is here without acute abnormality including no evidence of colitis or appendicitis.  Urinalysis without evidence of urinary tract infection.  Patient refuses pelvic exam.  Discussed with her the possibility of pelvic infection including PID.  Patient reports she will follow-up with OB/GYN for this.  There is blood in patient's urine however patient reports she is completing her menstrual cycle at this time.  Abdominal and transvaginal ultrasound performed to rule out torsion.  No evidence of ovarian torsion or ovarian cyst.  Patient is noted to have uterine fibroid.  Repeat exam her abdomen is soft and nontender.  Patient is eating and drinking in the emergency department without emesis.  At this time I do not believe she has an emergent medical condition.  She will need close follow-up with OB/GYN for further evaluation and pelvic exam.  Discussed reasons to return immediately to the emergency department.  Patient states understanding and is in agreement with the plan.  Final Clinical Impressions(s) / ED Diagnoses   Final diagnoses:  Right lower quadrant abdominal pain  Uterine leiomyoma, unspecified location    ED Discharge Orders           Ordered    ondansetron (ZOFRAN ODT) 4 MG disintegrating tablet     12/10/18 0500           Ekam Bonebrake, Jarrett Soho, PA-C 12/10/18 0501    Ripley Fraise, MD 12/10/18 [phone]

## 2018-12-10 ENCOUNTER — Emergency Department (HOSPITAL_COMMUNITY): Payer: Self-pay

## 2018-12-10 LAB — URINALYSIS, ROUTINE W REFLEX MICROSCOPIC
Bilirubin Urine: NEGATIVE
Glucose, UA: NEGATIVE mg/dL
Ketones, ur: NEGATIVE mg/dL
Leukocytes, UA: NEGATIVE
Nitrite: NEGATIVE
Protein, ur: NEGATIVE mg/dL
Specific Gravity, Urine: 1.012 (ref 1.005–1.030)
pH: 6 (ref 5.0–8.0)

## 2018-12-10 LAB — COMPREHENSIVE METABOLIC PANEL
ALBUMIN: 3.1 g/dL — AB (ref 3.5–5.0)
ALT: 10 U/L (ref 0–44)
AST: 35 U/L (ref 15–41)
Alkaline Phosphatase: 47 U/L (ref 38–126)
Anion gap: 5 (ref 5–15)
BUN: 12 mg/dL (ref 6–20)
CHLORIDE: 107 mmol/L (ref 98–111)
CO2: 25 mmol/L (ref 22–32)
Calcium: 8.3 mg/dL — ABNORMAL LOW (ref 8.9–10.3)
Creatinine, Ser: 0.93 mg/dL (ref 0.44–1.00)
GFR calc Af Amer: >60 mL/min (ref >60)
GFR calc non Af Amer: >60 mL/min (ref >60)
Glucose, Bld: 92 mg/dL (ref 70–99)
Potassium: 5.4 mmol/L — ABNORMAL HIGH (ref 3.5–5.1)
SODIUM: 137 mmol/L (ref 135–145)
Total Bilirubin: 1.1 mg/dL (ref 0.3–1.2)
Total Protein: 6.1 g/dL — ABNORMAL LOW (ref 6.5–8.1)

## 2018-12-10 LAB — HCG, QUANTITATIVE, PREGNANCY: hCG, Beta Chain, Quant, S: <1 m[IU]/mL (ref <5)

## 2018-12-10 LAB — CBC WITH DIFFERENTIAL/PLATELET
Abs Immature Granulocytes: 0.01 10*3/uL (ref 0.00–0.07)
Basophils Absolute: 0 10*3/uL (ref 0.0–0.1)
Basophils Relative: 0 %
Eosinophils Absolute: 0.1 10*3/uL (ref 0.0–0.5)
Eosinophils Relative: 1 %
HEMATOCRIT: 37.2 % (ref 36.0–46.0)
HEMOGLOBIN: 12.2 g/dL (ref 12.0–15.0)
Immature Granulocytes: 0 %
LYMPHS PCT: 45 %
Lymphs Abs: 3.4 10*3/uL (ref 0.7–4.0)
MCH: 31.6 pg (ref 26.0–34.0)
MCHC: 32.8 g/dL (ref 30.0–36.0)
MCV: 96.4 fL (ref 80.0–100.0)
Monocytes Absolute: 0.4 10*3/uL (ref 0.1–1.0)
Monocytes Relative: 6 %
Neutro Abs: 3.6 10*3/uL (ref 1.7–7.7)
Neutrophils Relative %: 48 %
Platelets: 265 10*3/uL (ref 150–400)
RBC: 3.86 MIL/uL — ABNORMAL LOW (ref 3.87–5.11)
RDW: 13.1 % (ref 11.5–15.5)
WBC: 7.7 10*3/uL (ref 4.0–10.5)
nRBC: 0 % (ref 0.0–0.2)

## 2018-12-10 LAB — LIPASE, BLOOD: Lipase: 43 U/L (ref 11–51)

## 2018-12-10 MED ORDER — MORPHINE SULFATE (PF) 4 MG/ML IV SOLN
4.0000 mg | Freq: Once | INTRAVENOUS | Status: AC
  Administered 2018-12-10: 4 mg via INTRAVENOUS
  Filled 2018-12-10: qty 1

## 2018-12-10 MED ORDER — IOHEXOL 300 MG/ML  SOLN
100.0000 mL | Freq: Once | INTRAMUSCULAR | Status: AC | PRN
  Administered 2018-12-10: 100 mL via INTRAVENOUS

## 2018-12-10 MED ORDER — ONDANSETRON 4 MG PO TBDP: ORAL_TABLET | ORAL | 0 refills | Status: DC

## 2018-12-10 MED ORDER — SODIUM CHLORIDE 0.9 % IV BOLUS
1000.0000 mL | Freq: Once | INTRAVENOUS | Status: AC
  Administered 2018-12-10: 1000 mL via INTRAVENOUS

## 2018-12-10 NOTE — ED Notes (Signed)
Pt refusing chem 8 for lab work. Order discontinued and provider aware

## 2018-12-10 NOTE — ED Notes (Signed)
Delay in lab draw,  Pt currently in ultrasound.

## 2018-12-10 NOTE — Discharge Instructions (Signed)
1. Medications: zofran, usual home medications °2. Treatment: rest, drink plenty of fluids, advance diet slowly °3. Follow Up: Please followup with your primary doctor in 2 days for discussion of your diagnoses and further evaluation after today's visit; if you do not have a primary care doctor use the resource guide provided to find one; Please return to the ER for persistent vomiting, high fevers or worsening symptoms ° °

## 2018-12-10 NOTE — ED Notes (Signed)
Patient verbalizes understanding of discharge instructions. Opportunity for questioning and answers were provided. Armband removed by staff, pt discharged from ED ambulatory.   

## 2018-12-14 ENCOUNTER — Encounter (HOSPITAL_COMMUNITY): Payer: Self-pay

## 2018-12-14 ENCOUNTER — Ambulatory Visit (HOSPITAL_COMMUNITY)
Admission: EM | Admit: 2018-12-14 | Discharge: 2018-12-14 | Disposition: A | Discharge status: Home / Self Care | Payer: Self-pay | Attending: Family Medicine | Admitting: Family Medicine

## 2018-12-14 DIAGNOSIS — A084 Viral intestinal infection, unspecified: Secondary | ICD-10-CM | POA: Insufficient documentation

## 2018-12-14 MED ORDER — ONDANSETRON 8 MG PO TBDP: 8.0000 mg | ORAL_TABLET | Freq: Three times a day (TID) | ORAL | 0 refills | Status: DC | PRN

## 2018-12-14 NOTE — ED Provider Notes (Signed)
Barbara Lam    CSN: [id number] Arrival date & time: 12/14/18  1213     History   Chief Complaint Chief Complaint  Patient presents with  . Nausea  . Emesis  . Diarrhea    HPI Barbara Lam is a 27 y.o. female.   27 year old woman, established patient, who presents with nausea, vomiting and diarrhea.  The diarrhea began on Friday night.  Patient works at [employer].  She started feeling bad on Friday night but then went to work yesterday and did all right.  Earlier this morning she started feeling sick again and vomited several times.  Patient's had just a few cramps but no persistent abdominal pain.  Patient has not had a fever.  She cannot recall anything she might have eaten that could have caused this.  She has had some chills but no fever.     Past Medical History:  Diagnosis Date  . Asthma   . Chronic headaches   . Hypertension     There are no active problems to display for this patient.   Past Surgical History:  Procedure Laterality Date  . EYE SURGERY      OB History    Gravida  0   Para  0   Term  0   Preterm      AB      Living        SAB      TAB      Ectopic      Multiple      Live Births               Home Medications    Prior to Admission medications   Medication Sig Start Date End Date Taking? Authorizing Provider  albuterol (PROVENTIL HFA;VENTOLIN HFA) 108 (90 Base) MCG/ACT inhaler Inhale 2 puffs into the lungs every 4 (four) hours as needed for wheezing or shortness of breath. 11/17/18   Lorin Glass, PA-C  amLODipine (NORVASC) 10 MG tablet Take 1 tablet (10 mg total) by mouth daily. 09/18/18   Estill Dooms, NP  cyclobenzaprine (FLEXERIL) 10 MG tablet Take 1 tablet (10 mg total) by mouth 3 (three) times daily as needed. 10/20/18   Triplett, Tammy, PA-C  dicyclomine (BENTYL) 20 MG tablet Take 1 tablet (20 mg total) by mouth 2 (two) times daily. 11/25/18   Tasia Catchings, Amy V, PA-C    ibuprofen (ADVIL,MOTRIN) 400 MG tablet Take 1 tablet (400 mg total) by mouth 4 (four) times daily. 10/31/18   Lily Kocher, PA-C  medroxyPROGESTERone (PROVERA) 10 MG tablet Take 10 mg by mouth See admin instructions. Take 10 mg by mouth two times a day until bleeding stops then take remaining tablets once a day until finished 06/26/18   [provider]  ondansetron (ZOFRAN-ODT) 8 MG disintegrating tablet Take 1 tablet (8 mg total) by mouth every 8 (eight) hours as needed for nausea. 12/14/18   Robyn Haber, MD  traMADol Veatrice Bourbon) 50 MG tablet 1 or 2 po q6h prn pain 10/31/18   Lily Kocher, PA-C    Family History Family History  Problem Relation Age of Onset  . Diabetes Other     Social History Social History   Tobacco Use  . Smoking status: Never Smoker  . Smokeless tobacco: Never Used  Substance Use Topics  . Alcohol use: Yes    Comment: Socially   . Drug use: No     Allergies   Lisinopril and Tomato  Review of Systems Review of Systems   Physical Exam Triage Vital Signs ED Triage Vitals  Enc Vitals Group     BP 12/14/18 1355 122/75     Pulse Rate 12/14/18 1355 (!) 106     Resp 12/14/18 1355 20     Temp 12/14/18 1355 98.9 F (37.2 C)     Temp Source 12/14/18 1355 Oral     SpO2 12/14/18 1355 100 %     Weight --      Height --      Head Circumference --      Peak Flow --      Pain Score 12/14/18 1356 7     Pain Loc --      Pain Edu? --      Excl. in [place]? --    No data found.  Updated Vital Signs BP 122/75 (BP Location: Right Arm)   Pulse (!) 106   Temp 98.9 F (37.2 C) (Oral)   Resp 20   LMP 11/19/2018   SpO2 100%    Physical Exam Vitals signs and nursing note reviewed.  Constitutional:      Appearance: Normal appearance.  HENT:     Head: Normocephalic.     Nose: Nose normal.     Mouth/Throat:     Mouth: Mucous membranes are moist.     Pharynx: Oropharynx is clear.  Eyes:     Conjunctiva/sclera: Conjunctivae normal.      Pupils: Pupils are equal, round, and reactive to light.  Neck:     Musculoskeletal: Normal range of motion and neck supple.  Cardiovascular:     Rate and Rhythm: Normal rate.     Heart sounds: Normal heart sounds.  Pulmonary:     Effort: Pulmonary effort is normal.     Breath sounds: Normal breath sounds.  Abdominal:     General: Bowel sounds are normal.     Palpations: Abdomen is soft.     Tenderness: There is no abdominal tenderness.  Musculoskeletal: Normal range of motion.  Skin:    General: Skin is warm and dry.  Neurological:     General: No focal deficit present.     Mental Status: She is alert.  Psychiatric:        Mood and Affect: Mood normal.      UC Treatments / Results  Labs (all labs ordered are listed, but only abnormal results are displayed) Labs Reviewed - No data to display  EKG None  Radiology No results found.  Procedures Procedures (including critical care time)  Medications Ordered in UC Medications - No data to display  Initial Impression / Assessment and Plan / UC Course  I have reviewed the triage vital signs and the nursing notes.  Pertinent labs & imaging results that were available during my care of the patient were reviewed by me and considered in my medical decision making (see chart for details).     Final Clinical Impressions(s) / UC Diagnoses   Final diagnoses:  Viral gastroenteritis   Discharge Instructions   None    ED Prescriptions    Medication Sig Dispense Auth. Provider   ondansetron (ZOFRAN-ODT) 8 MG disintegrating tablet Take 1 tablet (8 mg total) by mouth every 8 (eight) hours as needed for nausea. 12 tablet Robyn Haber, MD     Controlled Substance Prescriptions [place] Controlled Substance Registry consulted? Not Applicable   Robyn Haber, MD 12/14/18 [phone]

## 2018-12-14 NOTE — ED Triage Notes (Signed)
Pt presents with nausea, vomiting and diarrhea.

## 2019-01-05 ENCOUNTER — Encounter: Payer: Self-pay | Admitting: Obstetrics & Gynecology

## 2019-01-05 ENCOUNTER — Ambulatory Visit (INDEPENDENT_AMBULATORY_CARE_PROVIDER_SITE_OTHER): Payer: Self-pay | Admitting: Obstetrics & Gynecology

## 2019-01-05 VITALS — BP 131/100 | HR 88 | Ht 62.0 in | Wt 157.4 lb

## 2019-01-05 DIAGNOSIS — Z113 Encounter for screening for infections with a predominantly sexual mode of transmission: Secondary | ICD-10-CM

## 2019-01-05 DIAGNOSIS — R102 Pelvic and perineal pain: Secondary | ICD-10-CM

## 2019-01-05 NOTE — Progress Notes (Signed)
Subjective:     Barbara Lam is a 28 y.o. female G0 here for c/o pelvic pain. Pt was seen in the ED for pain. She was prev seen by Dr. Glo Herring who recommended a LnIUD for the pain.  Pt reports prev having regular monthly cycles that subsequently became irreg after taking 1 month of OCPs.   LMP Dec 9th-Jan 14th.   Pt does have an IUD ordered at the [facility] but, came her for eval due to the [place] finding in Dec that reported a suspected fibroid. Pt has not been sexually active in 6 months due to the pain.       Gynecologic History Patient's last menstrual period was 12/17/2018 (within days). Contraception: partner- female  Last Pap: <3 years. Results were: normal Last mammogram: n/a.   Obstetric History OB History  Gravida Para Term Preterm AB Living  0 0 0        SAB TAB Ectopic Multiple Live Births              The following portions of the patient's history were reviewed and updated as appropriate: allergies, current medications, past family history, past medical history, past social history, past surgical history and problem list.  Review of Systems Pertinent items are noted in HPI.    Objective:  BP (!) 131/100   Pulse 88   Ht 5\' 2"  (1.575 m)   Wt 157 lb 6.4 oz (71.4 kg)   LMP 12/17/2018 (Within Days)   BMI 28.79 kg/m    CONSTITUTIONAL: Well-developed, well-nourished female in no acute distress. Lying down when I arrived. Pt yawning often during exam HENT:  Normocephalic, atraumatic EYES: Conjunctivae and EOM are normal. No scleral icterus.  NECK: Normal range of motion SKIN: Skin is warm and dry. No rash noted. Not diaphoretic.No pallor. [place]: Alert and oriented to person, place, and time. Normal coordination.  Lungs: CTA CV:  RRR Abd: soft, NT, ND GU: EGBUS: no lesions Vagina: no blood in vault Cervix: no lesion; no mucopurulent d/c: no CMT Uterus: small, mobile Adnexa: no masses; + tenderness bilaterally > on Left side; no CMT   06/26/2018 CLINICAL DATA:   Dysmenorrhea.  Menorrhagia.  EXAM: TRANSABDOMINAL AND TRANSVAGINAL ULTRASOUND OF PELVIS  TECHNIQUE: Both transabdominal and transvaginal ultrasound examinations of the pelvis were performed. Transabdominal technique was performed for global imaging of the pelvis including uterus, ovaries, adnexal regions, and pelvic cul-de-sac. It was necessary to proceed with endovaginal exam following the transabdominal exam to visualize the endometrium.  COMPARISON:  None  FINDINGS: Uterus  Measurements: 9.9 x 4.1 x 5.2 cm. No fibroids or other mass visualized.  Endometrium  Thickness: 14 mm.  No focal abnormality visualized.  Right ovary  Measurements: 2.3 x 4.1 x 2.9 cm. Normal appearance/no adnexal mass.  Left ovary  Measurements: 2.6 x 4.2 x 2.7 cm. Within the left ovary there is a 1.8 x 1.3 x 1.6 cm cyst.  Other findings  Small amount of free fluid in the pelvis.  IMPRESSION: Endometrium measures 14 mm. If bleeding remains unresponsive to hormonal or medical therapy, sonohysterogram should be considered for focal lesion work-up. (Ref: Radiological Reasoning: Algorithmic Workup of Abnormal Vaginal Bleeding with Endovaginal Sonography and Sonohysterography. AJR 2008; 366:Q94-76)  12/10/2018 CLINICAL DATA:  Acute onset of right lower quadrant abdominal pain.  EXAM: TRANSABDOMINAL AND TRANSVAGINAL ULTRASOUND OF PELVIS  DOPPLER ULTRASOUND OF OVARIES  TECHNIQUE: Both transabdominal and transvaginal ultrasound examinations of the pelvis were performed. Transabdominal technique was performed for global imaging of  the pelvis including uterus, ovaries, adnexal regions, and pelvic cul-de-sac.  It was necessary to proceed with endovaginal exam following the transabdominal exam to visualize the endometrium and ovaries in greater detail. Color and duplex Doppler ultrasound was utilized to evaluate blood flow to the ovaries.  COMPARISON:  CT of the abdomen  and pelvis performed earlier today at 2:08 a.m.  FINDINGS: Uterus  Measurements: 10.0 x 4.4 x 5.9 cm = volume: 138.4 mL. A 3.6 cm vaguely heterogeneous mass at the uterine fundus likely reflects a fibroid, though difficult to fully characterize.  Endometrium  Thickness: 0.5 cm.  No focal abnormality visualized.  Right ovary  Measurements: 3.6 x 2.0 x 1.6 cm = volume: 6.6 mL. Normal appearance/no adnexal mass.  Left ovary  Measurements: 3.1 x 1.4 x 2.6 cm = volume: 6.5 mL. Normal appearance/no adnexal mass.  Pulsed Doppler evaluation of both ovaries demonstrates normal low-resistance arterial and venous waveforms.  Other findings  Trace free fluid is seen within the pelvic cul-de-sac.  IMPRESSION: 1. Likely uterine fibroid noted at the uterine fundus. 2. No evidence for ovarian torsion.  Assessment:   Pelvic pain and AUB with normal [place] rec cx to r/o STI (last cx done in 2016) - pt has already had an LnIUD ordered through a grant at the [facility].  Possible uterine fibroid- I have reviewed the [place] with the pt. It is not likely that her pain and AUB is related to the fibroid. This [place] finding was not noted on the prev [place] in 06/2018 when pt was having similar sx.     Plan:    Follow up in: 3 months.    F/u cx Pt will get LnIUD at the HD Pt will send me a note on MyChart 6 weeks after her LnIUD is placed to update me on her progress  Total face-to-face time with patient was 20 min.  Greater than 50% was spent in counseling and coordination of care with the patient.   Lallie Strahm L. Harraway-Smith, M.D., Cherlynn June

## 2019-01-06 LAB — GC/CHLAMYDIA PROBE AMP (~~LOC~~) NOT AT ARMC
CHLAMYDIA, DNA PROBE: NEGATIVE
Neisseria Gonorrhea: NEGATIVE

## 2019-02-02 ENCOUNTER — Ambulatory Visit (HOSPITAL_COMMUNITY)
Admission: EM | Admit: 2019-02-02 | Discharge: 2019-02-02 | Disposition: A | Discharge status: Home / Self Care | Payer: Self-pay | Attending: Family Medicine | Admitting: Family Medicine

## 2019-02-02 ENCOUNTER — Encounter (HOSPITAL_COMMUNITY): Payer: Self-pay | Admitting: Emergency Medicine

## 2019-02-02 DIAGNOSIS — R21 Rash and other nonspecific skin eruption: Secondary | ICD-10-CM

## 2019-02-02 DIAGNOSIS — J321 Chronic frontal sinusitis: Secondary | ICD-10-CM

## 2019-02-02 MED ORDER — AZITHROMYCIN 250 MG PO TABS: 250.0000 mg | ORAL_TABLET | Freq: Every day | ORAL | 0 refills | Status: DC

## 2019-02-02 MED ORDER — TRIAMCINOLONE ACETONIDE 0.1 % EX CREA: 1.0000 "application " | TOPICAL_CREAM | Freq: Two times a day (BID) | CUTANEOUS | 0 refills | Status: DC

## 2019-02-02 NOTE — Discharge Instructions (Signed)
Please try things such as zyrtec-D or allegra-D which is an antihistamine and decongestant.  Please try afrin which will help with nasal congestion but use for only three days.  Please also try using a netti pot on a regular occasion. Please try honey, vick's vapor rub, lozenges and humidifer for cough and sore throat  Please try the steroid on the area that itch.  Please follow up if your symptoms fail to improve.

## 2019-02-02 NOTE — ED Provider Notes (Signed)
[place]    CSN: [id number] Arrival date & time: 02/02/19  1939     History   Chief Complaint Chief Complaint  Patient presents with  . URI  . Rash    HPI Barbara Lam is a 28 y.o. female.   She is presenting with frontal congestion and a rash.  Her condition has been present for about a week.  She is felt hot but denies any temperatures.  Feels like her symptoms of stayed the same.  Has been around other people at work with similar symptoms.  Has not had any improvement with modalities to date.  The rash started a few days ago.  She is having it on her upper extremities.  It is pruritic in nature.  Does not take anything for it.  HPI  Past Medical History:  Diagnosis Date  . Asthma   . Chronic headaches   . Hypertension     There are no active problems to display for this patient.   Past Surgical History:  Procedure Laterality Date  . EYE SURGERY      OB History    Gravida  0   Para  0   Term  0   Preterm      AB      Living        SAB      TAB      Ectopic      Multiple      Live Births               Home Medications    Prior to Admission medications   Medication Sig Start Date End Date Taking? Authorizing Provider  acetaminophen-codeine (TYLENOL #2) 300-15 MG tablet Take 1 tablet by mouth every 4 (four) hours as needed for moderate pain.    [provider]  albuterol (PROVENTIL HFA;VENTOLIN HFA) 108 (90 Base) MCG/ACT inhaler Inhale 2 puffs into the lungs every 4 (four) hours as needed for wheezing or shortness of breath. 11/17/18   Lorin Glass, PA-C  amLODipine (NORVASC) 10 MG tablet Take 1 tablet (10 mg total) by mouth daily. 09/18/18   Estill Dooms, NP  azithromycin (ZITHROMAX) 250 MG tablet Take 1 tablet (250 mg total) by mouth daily. Take first 2 tablets together, then 1 every day until finished. 02/02/19   Rosemarie Ax, MD  cyclobenzaprine (FLEXERIL) 10 MG tablet Take 1 tablet (10 mg total)  by mouth 3 (three) times daily as needed. 10/20/18   Triplett, Tammy, PA-C  dicyclomine (BENTYL) 20 MG tablet Take 1 tablet (20 mg total) by mouth 2 (two) times daily. 11/25/18   Tasia Catchings, Amy V, PA-C  ibuprofen (ADVIL,MOTRIN) 400 MG tablet Take 1 tablet (400 mg total) by mouth 4 (four) times daily. 10/31/18   Lily Kocher, PA-C  medroxyPROGESTERone (PROVERA) 10 MG tablet Take 10 mg by mouth See admin instructions. Take 10 mg by mouth two times a day until bleeding stops then take remaining tablets once a day until finished 06/26/18   [provider]  traMADol Veatrice Bourbon) 50 MG tablet 1 or 2 po q6h prn pain 10/31/18   Lily Kocher, PA-C  triamcinolone cream (KENALOG) 0.1 % Apply 1 application topically 2 (two) times daily. 02/02/19   Rosemarie Ax, MD    Family History Family History  Problem Relation Age of Onset  . Diabetes Other     Social History Social History   Tobacco Use  . Smoking status: Current Some  Day Smoker    Years: 3.00    Types: Cigars  . Smokeless tobacco: Never Used  Substance Use Topics  . Alcohol use: Yes    Comment: Socially   . Drug use: No     Allergies   Lisinopril and Tomato   Review of Systems Review of Systems  Constitutional: Negative for fever.  HENT: Positive for congestion and sinus pressure.   Respiratory: Positive for cough.   Cardiovascular: Negative for chest pain.  Gastrointestinal: Negative for abdominal pain.  Musculoskeletal: Negative for back pain.  Skin: Positive for rash.  Neurological: Negative for weakness.  Hematological: Negative for adenopathy.  Psychiatric/Behavioral: Negative for agitation.     Physical Exam Triage Vital Signs ED Triage Vitals  Enc Vitals Group     BP 02/02/19 2026 (!) 131/96     Pulse Rate 02/02/19 2026 75     Resp 02/02/19 2026 16     Temp 02/02/19 2026 98.2 F (36.8 C)     Temp Source 02/02/19 2026 Oral     SpO2 02/02/19 2026 100 %     Weight --      Height --      Head  Circumference --      Peak Flow --      Pain Score 02/02/19 2031 2     Pain Loc --      Pain Edu? --      Excl. in [place]? --    No data found.  Updated Vital Signs BP (!) 131/96 (BP Location: Left Arm)   Pulse 75   Temp 98.2 F (36.8 C) (Oral)   Resp 16   SpO2 100%   Visual Acuity Right Eye Distance:   Left Eye Distance:   Bilateral Distance:    Right Eye Near:   Left Eye Near:    Bilateral Near:     Physical Exam Gen: NAD, alert, cooperative with exam,  Eye: normal EOM, normal conjunctiva and lids CV:  no edema, +2 pedal pulses, regular rate and rhythm, S1-S2   Resp: no accessory muscle use, non-labored, clear to auscultation bilaterally, no crackles or wheezes Skin: Maculopapular rash occurring on bilateral lower extremities, no areas of induration  Neuro: normal tone, normal sensation to touch Psych:  normal insight, alert and oriented MSK: Normal gait, normal strength    UC Treatments / Results  Labs (all labs ordered are listed, but only abnormal results are displayed) Labs Reviewed - No data to display  EKG None  Radiology No results found.  Procedures Procedures (including critical care time)  Medications Ordered in UC Medications - No data to display  Initial Impression / Assessment and Plan / UC Course  I have reviewed the triage vital signs and the nursing notes.  Pertinent labs & imaging results that were available during my care of the patient were reviewed by me and considered in my medical decision making (see chart for details).     Barbara Lam is a 28 year old female is presenting with acute sinusitis and rash.  Symptoms been present for a week with a change in sinusitis with no fevers.  Counseled on supportive care and provided azithromycin.  Rash is eczematous in nature.  Provided Kenalog and counseled on emollients.  Given indications to follow-up.  Final Clinical Impressions(s) / UC Diagnoses   Final diagnoses:  Rash  Chronic frontal  sinusitis     Discharge Instructions     Please try things such as zyrtec-D or allegra-D which is an antihistamine  and decongestant.  Please try afrin which will help with nasal congestion but use for only three days.  Please also try using a netti pot on a regular occasion. Please try honey, vick's vapor rub, lozenges and humidifer for cough and sore throat  Please try the steroid on the area that itch.  Please follow up if your symptoms fail to improve.     ED Prescriptions    Medication Sig Dispense Auth. Provider   azithromycin (ZITHROMAX) 250 MG tablet Take 1 tablet (250 mg total) by mouth daily. Take first 2 tablets together, then 1 every day until finished. 6 tablet Rosemarie Ax, MD   triamcinolone cream (KENALOG) 0.1 % Apply 1 application topically 2 (two) times daily. 30 g Rosemarie Ax, MD     Controlled Substance Prescriptions [place] Controlled Substance Registry consulted? Not Applicable   Rosemarie Ax, MD 02/02/19 2102

## 2019-02-02 NOTE — ED Triage Notes (Signed)
Pt sts URI and rash to arms that is itchy

## 2019-02-22 ENCOUNTER — Other Ambulatory Visit: Payer: Self-pay

## 2019-02-22 ENCOUNTER — Ambulatory Visit (HOSPITAL_COMMUNITY)
Admission: EM | Admit: 2019-02-22 | Discharge: 2019-02-22 | Disposition: A | Discharge status: Home / Self Care | Payer: Self-pay | Attending: Family Medicine | Admitting: Family Medicine

## 2019-02-22 ENCOUNTER — Encounter (HOSPITAL_COMMUNITY): Payer: Self-pay

## 2019-02-22 DIAGNOSIS — J069 Acute upper respiratory infection, unspecified: Secondary | ICD-10-CM

## 2019-02-22 MED ORDER — BENZONATATE 100 MG PO CAPS: 100.0000 mg | ORAL_CAPSULE | Freq: Three times a day (TID) | ORAL | 0 refills | Status: DC | PRN

## 2019-02-22 MED ORDER — ONDANSETRON 8 MG PO TBDP: 8.0000 mg | ORAL_TABLET | Freq: Three times a day (TID) | ORAL | 0 refills | Status: DC | PRN

## 2019-02-22 NOTE — Discharge Instructions (Addendum)
Expect symptoms to clear over the next 48 hours.  If you get worse, please return or see your primary care provider.

## 2019-02-22 NOTE — ED Provider Notes (Signed)
[place]    CSN: [id number] Arrival date & time: 02/22/19  1117     History   Chief Complaint Chief Complaint  Patient presents with  . Chills    HPI Barbara Lam is a 28 y.o. female.   Chills, weakness, productive cough,  aching in chest, vomiting x 1 today, nausea, dizziness and left sided headache.  Gradual onset 4 days ago.  Works for [employer].       Past Medical History:  Diagnosis Date  . Asthma   . Chronic headaches   . Hypertension     There are no active problems to display for this patient.   Past Surgical History:  Procedure Laterality Date  . EYE SURGERY      OB History    Gravida  0   Para  0   Term  0   Preterm      AB      Living        SAB      TAB      Ectopic      Multiple      Live Births               Home Medications    Prior to Admission medications   Medication Sig Start Date End Date Taking? Authorizing Provider  albuterol (PROVENTIL HFA;VENTOLIN HFA) 108 (90 Base) MCG/ACT inhaler Inhale 2 puffs into the lungs every 4 (four) hours as needed for wheezing or shortness of breath. 11/17/18  Yes Lorin Glass, PA-C  amLODipine (NORVASC) 10 MG tablet Take 1 tablet (10 mg total) by mouth daily. 09/18/18  Yes Derrek Monaco A, NP  acetaminophen-codeine (TYLENOL #2) 300-15 MG tablet Take 1 tablet by mouth every 4 (four) hours as needed for moderate pain.    [provider]  benzonatate (TESSALON) 100 MG capsule Take 1-2 capsules (100-200 mg total) by mouth 3 (three) times daily as needed for cough. 02/22/19   Robyn Haber, MD  cyclobenzaprine (FLEXERIL) 10 MG tablet Take 1 tablet (10 mg total) by mouth 3 (three) times daily as needed. 10/20/18   Triplett, Tammy, PA-C  ibuprofen (ADVIL,MOTRIN) 400 MG tablet Take 1 tablet (400 mg total) by mouth 4 (four) times daily. 10/31/18   Lily Kocher, PA-C  medroxyPROGESTERone (PROVERA) 10 MG tablet Take 10 mg by mouth See admin  instructions. Take 10 mg by mouth two times a day until bleeding stops then take remaining tablets once a day until finished 06/26/18   [provider]  ondansetron (ZOFRAN-ODT) 8 MG disintegrating tablet Take 1 tablet (8 mg total) by mouth every 8 (eight) hours as needed for nausea. 02/22/19   Robyn Haber, MD  triamcinolone cream (KENALOG) 0.1 % Apply 1 application topically 2 (two) times daily. 02/02/19   Rosemarie Ax, MD    Family History Family History  Problem Relation Age of Onset  . Diabetes Other     Social History Social History   Tobacco Use  . Smoking status: Current Some Day Smoker    Years: 3.00    Types: Cigars  . Smokeless tobacco: Never Used  Substance Use Topics  . Alcohol use: Yes    Comment: Socially   . Drug use: No     Allergies   Lisinopril and Tomato   Review of Systems Review of Systems  Constitutional: Positive for chills and fatigue. Negative for fever.  HENT: Positive for sinus pain. Negative for sore throat.  Gastrointestinal: Positive for abdominal pain, nausea and vomiting. Negative for diarrhea.  Genitourinary: Negative for dysuria and hematuria.  Musculoskeletal: Positive for myalgias.  Skin: Negative for rash.     Physical Exam Triage Vital Signs ED Triage Vitals  Enc Vitals Group     BP 02/22/19 1214 128/84     Pulse Rate 02/22/19 1214 93     Resp 02/22/19 1214 17     Temp 02/22/19 1214 98.3 F (36.8 C)     Temp Source 02/22/19 1214 Oral     SpO2 02/22/19 1214 96 %     Weight --      Height --      Head Circumference --      Peak Flow --      Pain Score 02/22/19 1215 8     Pain Loc --      Pain Edu? --      Excl. in [place]? --    Orthostatic VS for the past 24 hrs:  BP- Lying Pulse- Lying BP- Sitting Pulse- Sitting BP- Standing at 0 minutes Pulse- Standing at 0 minutes  02/22/19 1316 116/82 82 125/84 90 120/87 91    Updated Vital Signs BP 128/84 (BP Location: Left Arm)   Pulse 93   Temp 98.3 F (36.8  C) (Oral)   Resp 17   LMP 02/21/2019 (Exact Date)   SpO2 97%   Physical Exam Vitals signs and nursing note reviewed.  Constitutional:      Appearance: She is ill-appearing.  HENT:     Head:     Comments: Tender left supra-orbital face    Right Ear: External ear normal. There is impacted cerumen.     Left Ear: External ear normal. There is impacted cerumen.     Nose:     Comments: Redness nasal passages    Mouth/Throat:     Mouth: Mucous membranes are moist.     Pharynx: No oropharyngeal exudate.  Eyes:     Pupils: Pupils are equal, round, and reactive to light.  Neck:     Musculoskeletal: Normal range of motion and neck supple.  Cardiovascular:     Rate and Rhythm: Normal rate and regular rhythm.  Pulmonary:     Effort: Pulmonary effort is normal.     Breath sounds: Normal breath sounds.  Abdominal:     General: Bowel sounds are normal.     Tenderness: There is abdominal tenderness.     Comments: epigastrium  Neurological:     Mental Status: She is alert and oriented to person, place, and time.  Psychiatric:        Mood and Affect: Mood normal.        Behavior: Behavior normal.        Thought Content: Thought content normal.        Judgment: Judgment normal.      UC Treatments / Results  Labs (all labs ordered are listed, but only abnormal results are displayed) Labs Reviewed - No data to display  EKG None  Radiology No results found.  Procedures Procedures (including critical care time)  Medications Ordered in UC Medications - No data to display  Initial Impression / Assessment and Plan / UC Course  I have reviewed the triage vital signs and the nursing notes.  Pertinent labs & imaging results that were available during my care of the patient were reviewed by me and considered in my medical decision making (see chart for details).     Final Clinical  Impressions(s) / UC Diagnoses   Final diagnoses:  Viral upper respiratory tract infection      Discharge Instructions     Expect symptoms to clear over the next 48 hours.  If you get worse, please return or see your primary care provider.    ED Prescriptions    Medication Sig Dispense Auth. Provider   benzonatate (TESSALON) 100 MG capsule Take 1-2 capsules (100-200 mg total) by mouth 3 (three) times daily as needed for cough. 40 capsule Robyn Haber, MD   ondansetron (ZOFRAN-ODT) 8 MG disintegrating tablet Take 1 tablet (8 mg total) by mouth every 8 (eight) hours as needed for nausea. 12 tablet Robyn Haber, MD     Controlled Substance Prescriptions [place] Controlled Substance Registry consulted? Not Applicable   Robyn Haber, MD 02/22/19 1323

## 2019-02-22 NOTE — ED Notes (Signed)
Pt discharged by provider.

## 2019-02-22 NOTE — ED Triage Notes (Signed)
Pt presents to [facility] for cold symptoms x4 days, pt complains of vomiting today, chills, and cough and congestion, Pt OTC medications but pt has no relief

## 2019-03-08 ENCOUNTER — Encounter (HOSPITAL_COMMUNITY): Payer: Self-pay | Admitting: Emergency Medicine

## 2019-03-08 ENCOUNTER — Emergency Department (HOSPITAL_COMMUNITY)
Admission: EM | Admit: 2019-03-08 | Discharge: 2019-03-09 | Disposition: A | Discharge status: Home / Self Care | Payer: Self-pay | Attending: Emergency Medicine | Admitting: Emergency Medicine

## 2019-03-08 ENCOUNTER — Other Ambulatory Visit: Payer: Self-pay

## 2019-03-08 DIAGNOSIS — Y9289 Other specified places as the place of occurrence of the external cause: Secondary | ICD-10-CM | POA: Insufficient documentation

## 2019-03-08 DIAGNOSIS — Y990 Civilian activity done for income or pay: Secondary | ICD-10-CM | POA: Insufficient documentation

## 2019-03-08 DIAGNOSIS — I10 Essential (primary) hypertension: Secondary | ICD-10-CM | POA: Insufficient documentation

## 2019-03-08 DIAGNOSIS — Z79899 Other long term (current) drug therapy: Secondary | ICD-10-CM | POA: Insufficient documentation

## 2019-03-08 DIAGNOSIS — X503XXA Overexertion from repetitive movements, initial encounter: Secondary | ICD-10-CM | POA: Insufficient documentation

## 2019-03-08 DIAGNOSIS — F17290 Nicotine dependence, other tobacco product, uncomplicated: Secondary | ICD-10-CM | POA: Insufficient documentation

## 2019-03-08 DIAGNOSIS — Y9389 Activity, other specified: Secondary | ICD-10-CM | POA: Insufficient documentation

## 2019-03-08 DIAGNOSIS — S39012A Strain of muscle, fascia and tendon of lower back, initial encounter: Secondary | ICD-10-CM | POA: Insufficient documentation

## 2019-03-08 NOTE — ED Notes (Signed)
ED Provider at bedside. 

## 2019-03-08 NOTE — ED Triage Notes (Signed)
C/o lower back pain since Friday.  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

## 2019-03-09 MED ORDER — IBUPROFEN 800 MG PO TABS: 800.0000 mg | ORAL_TABLET | Freq: Four times a day (QID) | ORAL | 0 refills | Status: DC | PRN

## 2019-03-09 MED ORDER — METHOCARBAMOL 500 MG PO TABS: 500.0000 mg | ORAL_TABLET | Freq: Three times a day (TID) | ORAL | 0 refills | Status: DC | PRN

## 2019-03-09 NOTE — ED Provider Notes (Signed)
[facility] EMERGENCY DEPARTMENT Provider Note   CSN: [id number] Arrival date & time: 03/08/19  2325    History   Chief Complaint Chief Complaint  Patient presents with  . Back Pain    HPI Barbara Lam is a 28 y.o. female.     Patient presents to the emergency department for evaluation of back pain.  Symptoms began 2 days ago while at work.  She had to leave work early.  She does lift heavy boxes while at work.  She is unaware of any specific instance that caused an injury, however.  She reports that pain worsened yesterday and tonight she was sent to the ER for evaluation by her job because of continued pain.  She does have some aching in the tops of her legs but pain does not radiate down through the legs.  No weakness, numbness, tingling in lower extremities.  No change in bowel or bladder function.     Past Medical History:  Diagnosis Date  . Asthma   . Chronic headaches   . Hypertension     There are no active problems to display for this patient.   Past Surgical History:  Procedure Laterality Date  . EYE SURGERY       OB History    Gravida  0   Para  0   Term  0   Preterm      AB      Living        SAB      TAB      Ectopic      Multiple      Live Births               Home Medications    Prior to Admission medications   Medication Sig Start Date End Date Taking? Authorizing Provider  acetaminophen-codeine (TYLENOL #2) 300-15 MG tablet Take 1 tablet by mouth every 4 (four) hours as needed for moderate pain.    [provider]  albuterol (PROVENTIL HFA;VENTOLIN HFA) 108 (90 Base) MCG/ACT inhaler Inhale 2 puffs into the lungs every 4 (four) hours as needed for wheezing or shortness of breath. 11/17/18   Lorin Glass, PA-C  amLODipine (NORVASC) 10 MG tablet Take 1 tablet (10 mg total) by mouth daily. 09/18/18   Estill Dooms, NP  benzonatate (TESSALON) 100 MG capsule Take 1-2 capsules (100-200 mg  total) by mouth 3 (three) times daily as needed for cough. 02/22/19   Robyn Haber, MD  cyclobenzaprine (FLEXERIL) 10 MG tablet Take 1 tablet (10 mg total) by mouth 3 (three) times daily as needed. 10/20/18   Triplett, Tammy, PA-C  ibuprofen (ADVIL,MOTRIN) 800 MG tablet Take 1 tablet (800 mg total) by mouth every 6 (six) hours as needed for moderate pain. 03/09/19   Orpah Greek, MD  medroxyPROGESTERone (PROVERA) 10 MG tablet Take 10 mg by mouth See admin instructions. Take 10 mg by mouth two times a day until bleeding stops then take remaining tablets once a day until finished 06/26/18   [provider]  methocarbamol (ROBAXIN) 500 MG tablet Take 1 tablet (500 mg total) by mouth every 8 (eight) hours as needed for muscle spasms. 03/09/19   Orpah Greek, MD  ondansetron (ZOFRAN-ODT) 8 MG disintegrating tablet Take 1 tablet (8 mg total) by mouth every 8 (eight) hours as needed for nausea. 02/22/19   Robyn Haber, MD  triamcinolone cream (KENALOG) 0.1 % Apply 1 application topically 2 (two) times  daily. 02/02/19   Rosemarie Ax, MD    Family History Family History  Problem Relation Age of Onset  . Diabetes Other     Social History Social History   Tobacco Use  . Smoking status: Current Some Day Smoker    Years: 3.00    Types: Cigars  . Smokeless tobacco: Never Used  Substance Use Topics  . Alcohol use: Yes    Comment: Socially   . Drug use: No     Allergies   Lisinopril and Tomato   Review of Systems Review of Systems  Musculoskeletal: Positive for back pain.  All other systems reviewed and are negative.    Physical Exam Updated Vital Signs BP 135/88 (BP Location: Right Arm)   Pulse 100   Temp 98.5 F (36.9 C) (Oral)   Resp 18   LMP 03/08/2019   SpO2 99%   Physical Exam Vitals signs and nursing note reviewed.  Constitutional:      General: She is not in acute distress.    Appearance: Normal appearance. She is well-developed.  HENT:      Head: Normocephalic and atraumatic.     Right Ear: Hearing normal.     Left Ear: Hearing normal.     Nose: Nose normal.  Eyes:     Conjunctiva/sclera: Conjunctivae normal.     Pupils: Pupils are equal, round, and reactive to light.  Neck:     Musculoskeletal: Normal range of motion and neck supple.  Cardiovascular:     Rate and Rhythm: Regular rhythm.     Heart sounds: S1 normal and S2 normal. No murmur. No friction rub. No gallop.   Pulmonary:     Effort: Pulmonary effort is normal. No respiratory distress.     Breath sounds: Normal breath sounds.  Chest:     Chest wall: No tenderness.  Abdominal:     General: Bowel sounds are normal.     Palpations: Abdomen is soft.     Tenderness: There is no abdominal tenderness. There is no guarding or rebound. Negative signs include Murphy's sign and McBurney's sign.     Hernia: No hernia is present.  Musculoskeletal: Normal range of motion.     Lumbar back: She exhibits tenderness.       Back:  Skin:    General: Skin is warm and dry.     Findings: No rash.  Neurological:     Mental Status: She is alert and oriented to person, place, and time.     GCS: GCS eye subscore is 4. GCS verbal subscore is 5. GCS motor subscore is 6.     Cranial Nerves: No cranial nerve deficit.     Sensory: No sensory deficit.     Coordination: Coordination normal.  Psychiatric:        Speech: Speech normal.        Behavior: Behavior normal.        Thought Content: Thought content normal.      ED Treatments / Results  Labs (all labs ordered are listed, but only abnormal results are displayed) Labs Reviewed - No data to display  EKG None  Radiology No results found.  Procedures Procedures (including critical care time)  Medications Ordered in ED Medications - No data to display   Initial Impression / Assessment and Plan / ED Course  I have reviewed the triage vital signs and the nursing notes.  Pertinent labs & imaging results that  were available during my care of the patient  were reviewed by me and considered in my medical decision making (see chart for details).        Patient presents to the ER with musculoskeletal back pain. Examination reveals back tenderness without any associated neurologic findings. Patient's strength, sensation and reflexes were normal. There is no evidence of saddle anesthesia. Patient does not have a foot drop. Patient has not experienced any change in bowel or bladder function. As such, patient did not require any imaging or further studies. Patient was treated with analgesia.  Final Clinical Impressions(s) / ED Diagnoses   Final diagnoses:  Strain of lumbar region, initial encounter    ED Discharge Orders         Ordered    methocarbamol (ROBAXIN) 500 MG tablet  Every 8 hours PRN     03/09/19 0019    ibuprofen (ADVIL,MOTRIN) 800 MG tablet  Every 6 hours PRN     03/09/19 0019           Orpah Greek, MD 03/09/19 [phone]

## 2019-03-09 NOTE — ED Notes (Signed)
RN able have pt sign b/c registration is in the chart.

## 2019-04-04 ENCOUNTER — Ambulatory Visit (INDEPENDENT_AMBULATORY_CARE_PROVIDER_SITE_OTHER): Payer: Self-pay

## 2019-04-04 ENCOUNTER — Other Ambulatory Visit: Payer: Self-pay

## 2019-04-04 ENCOUNTER — Ambulatory Visit (HOSPITAL_COMMUNITY)
Admission: EM | Admit: 2019-04-04 | Discharge: 2019-04-04 | Disposition: A | Discharge status: Home / Self Care | Payer: Self-pay | Attending: Family Medicine | Admitting: Family Medicine

## 2019-04-04 DIAGNOSIS — J069 Acute upper respiratory infection, unspecified: Secondary | ICD-10-CM

## 2019-04-04 DIAGNOSIS — B9789 Other viral agents as the cause of diseases classified elsewhere: Secondary | ICD-10-CM

## 2019-04-04 DIAGNOSIS — H00014 Hordeolum externum left upper eyelid: Secondary | ICD-10-CM

## 2019-04-04 MED ORDER — CEPHALEXIN 500 MG PO CAPS: 500.0000 mg | ORAL_CAPSULE | Freq: Four times a day (QID) | ORAL | 0 refills | Status: AC

## 2019-04-04 MED ORDER — BENZONATATE 200 MG PO CAPS: 200.0000 mg | ORAL_CAPSULE | Freq: Three times a day (TID) | ORAL | 0 refills | Status: AC | PRN

## 2019-04-04 MED ORDER — CETIRIZINE HCL 10 MG PO CAPS: 10.0000 mg | ORAL_CAPSULE | Freq: Every day | ORAL | 0 refills | Status: DC

## 2019-04-04 MED ORDER — ALBUTEROL SULFATE HFA 108 (90 BASE) MCG/ACT IN AERS: 1.0000 | INHALATION_SPRAY | Freq: Four times a day (QID) | RESPIRATORY_TRACT | 0 refills | Status: AC | PRN

## 2019-04-04 NOTE — ED Provider Notes (Signed)
[facility]    CSN: [id number] Arrival date & time: 04/04/19  1653     History   Chief Complaint Chief Complaint  Patient presents with  . Cough    HPI LISHA VITALE is a 28 y.o. female history of asthma, hypertension presenting today for evaluation of cough.  Patient has had cough, congestion, chills, sore throat and body aches.  Symptoms have been going on for approximately 1 week.  Sore throat has been off and on.  She has tried multiple over-the-counter medicines including Mucinex, Sudafed and combo cold and flu medicine.  Patient denies any recent travel.  No known direct exposure to COVID-19, but does work at [employer] center where there was a known [place] outbreak.  She denies any fevers, mainly chills and body aches.  She has had some shortness of breath, and feels her asthma has worsened, but is also attributed this to pollen.  Patient has also developed a bump to her left upper eyelid over the past few days.  Feels irritating to her eye when she blinks.  Has history of previous styes.  Denies changes in vision or drainage.  HPI  Past Medical History:  Diagnosis Date  . Asthma   . Chronic headaches   . Hypertension     There are no active problems to display for this patient.   Past Surgical History:  Procedure Laterality Date  . EYE SURGERY      OB History    Gravida  0   Para  0   Term  0   Preterm      AB      Living        SAB      TAB      Ectopic      Multiple      Live Births               Home Medications    Prior to Admission medications   Medication Sig Start Date End Date Taking? Authorizing Provider  acetaminophen-codeine (TYLENOL #2) 300-15 MG tablet Take 1 tablet by mouth every 4 (four) hours as needed for moderate pain.    [provider]  albuterol (VENTOLIN HFA) 108 (90 Base) MCG/ACT inhaler Inhale 1-2 puffs into the lungs every 6 (six) hours as needed for wheezing or shortness of  breath. 04/04/19   Odie Rauen C, PA-C  amLODipine (NORVASC) 10 MG tablet Take 1 tablet (10 mg total) by mouth daily. 09/18/18   Estill Dooms, NP  benzonatate (TESSALON) 200 MG capsule Take 1 capsule (200 mg total) by mouth 3 (three) times daily as needed for up to 7 days for cough. 04/04/19 04/11/19  Dawud Mays C, PA-C  cephALEXin (KEFLEX) 500 MG capsule Take 1 capsule (500 mg total) by mouth 4 (four) times daily for 5 days. 04/04/19 04/09/19  Diavian Furgason C, PA-C  Cetirizine HCl 10 MG CAPS Take 1 capsule (10 mg total) by mouth daily. 04/04/19   Jamani Eley C, PA-C  cyclobenzaprine (FLEXERIL) 10 MG tablet Take 1 tablet (10 mg total) by mouth 3 (three) times daily as needed. 10/20/18   Triplett, Tammy, PA-C  ibuprofen (ADVIL,MOTRIN) 800 MG tablet Take 1 tablet (800 mg total) by mouth every 6 (six) hours as needed for moderate pain. 03/09/19   Orpah Greek, MD  medroxyPROGESTERone (PROVERA) 10 MG tablet Take 10 mg by mouth See admin instructions. Take 10 mg by mouth two times a day until  bleeding stops then take remaining tablets once a day until finished 06/26/18   [provider]  methocarbamol (ROBAXIN) 500 MG tablet Take 1 tablet (500 mg total) by mouth every 8 (eight) hours as needed for muscle spasms. 03/09/19   Orpah Greek, MD  ondansetron (ZOFRAN-ODT) 8 MG disintegrating tablet Take 1 tablet (8 mg total) by mouth every 8 (eight) hours as needed for nausea. 02/22/19   Robyn Haber, MD  triamcinolone cream (KENALOG) 0.1 % Apply 1 application topically 2 (two) times daily. 02/02/19   Rosemarie Ax, MD    Family History Family History  Problem Relation Age of Onset  . Diabetes Other     Social History Social History   Tobacco Use  . Smoking status: Current Some Day Smoker    Years: 3.00    Types: Cigars  . Smokeless tobacco: Never Used  Substance Use Topics  . Alcohol use: Yes    Comment: Socially   . Drug use: No     Allergies    Lisinopril and Tomato   Review of Systems Review of Systems  Constitutional: Positive for appetite change, chills and fatigue. Negative for activity change and fever.  HENT: Positive for congestion, rhinorrhea and sore throat. Negative for ear pain, sinus pressure and trouble swallowing.   Eyes: Negative for discharge and redness.  Respiratory: Positive for cough and shortness of breath. Negative for chest tightness and wheezing.   Cardiovascular: Negative for chest pain.  Gastrointestinal: Positive for diarrhea and nausea. Negative for abdominal pain and vomiting.  Musculoskeletal: Positive for arthralgias and myalgias.  Skin: Negative for rash.  Neurological: Negative for dizziness, light-headedness and headaches.     Physical Exam Triage Vital Signs ED Triage Vitals  Enc Vitals Group     BP 04/04/19 1705 133/78     Pulse Rate 04/04/19 1705 98     Resp 04/04/19 1705 16     Temp 04/04/19 1705 98.4 F (36.9 C)     Temp Source 04/04/19 1705 Oral     SpO2 04/04/19 1705 100 %     Weight --      Height --      Head Circumference --      Peak Flow --      Pain Score 04/04/19 1703 3     Pain Loc --      Pain Edu? --      Excl. in [place]? --    No data found.  Updated Vital Signs BP 133/78 (BP Location: Right Arm)   Pulse 98   Temp 98.4 F (36.9 C) (Oral)   Resp 16   LMP 03/18/2019   SpO2 100%   Visual Acuity Right Eye Distance:   Left Eye Distance:   Bilateral Distance:    Right Eye Near:   Left Eye Near:    Bilateral Near:     Physical Exam Vitals signs and nursing note reviewed.  Constitutional:      General: She is not in acute distress.    Appearance: She is well-developed.  HENT:     Head: Normocephalic and atraumatic.     Ears:     Comments: Bilateral ears without tenderness to palpation of external auricle, tragus and mastoid, EAC's without erythema or swelling, TM's with good bony landmarks and cone of light. Non erythematous.     Nose:     Comments:  Nasal mucosa pink, no rhinorrhea present    Mouth/Throat:     Comments: Oral mucosa pink  and moist, no tonsillar enlargement or exudate. Posterior pharynx patent and nonerythematous, no uvula deviation or swelling. Normal phonation. Eyes:     Conjunctiva/sclera: Conjunctivae normal.     Comments: Right upper eyelid with small area of swelling and erythema  Neck:     Musculoskeletal: Neck supple.  Cardiovascular:     Rate and Rhythm: Normal rate and regular rhythm.     Heart sounds: No murmur.  Pulmonary:     Effort: Pulmonary effort is normal. No respiratory distress.     Breath sounds: Normal breath sounds.     Comments: Breathing comfortably at rest, CTABL, no wheezing, rales or other adventitious sounds auscultated Abdominal:     Palpations: Abdomen is soft.     Tenderness: There is no abdominal tenderness.  Skin:    General: Skin is warm and dry.  Neurological:     Mental Status: She is alert.      UC Treatments / Results  Labs (all labs ordered are listed, but only abnormal results are displayed) Labs Reviewed - No data to display  EKG None  Radiology No results found.  Procedures Procedures (including critical care time)  Medications Ordered in UC Medications - No data to display  Initial Impression / Assessment and Plan / UC Course  I have reviewed the triage vital signs and the nursing notes.  Pertinent labs & imaging results that were available during my care of the patient were reviewed by me and considered in my medical decision making (see chart for details).     28 year old female, vital signs stable, no hypoxia or fever.  Symptoms most likely COVID-19.  Chest x-ray appears negative for pneumonia.  Will call patient if radiology read different.  Will recommend continued symptomatic and supportive care, rest and self quarantine at home until symptoms resolve.  Refilled albuterol inhaler.  Tessalon for cough.  Daily cetirizine to help with congestion and  drainage that could be irritating throat.  Patient does appear to have hordeolum of the left upper eyelid, recommended warm compresses.  Did provide Keflex to initiate if symptoms not resolving with warm compresses in order to prevent patient from returning giving high risk of having COVID.  Continue to monitor all symptoms, breathing and temperature,Discussed strict return precautions. Patient verbalized understanding and is agreeable with plan.  Final Clinical Impressions(s) / UC Diagnoses   Final diagnoses:  Viral URI with cough  Hordeolum externum of left upper eyelid     Discharge Instructions     No pneumonia on chest x-ray, there will be a radiologist that will look at this as well and if they were to see anything different I will call you Your symptoms are most likely coronavirus Please stay home until symptoms fully resolve, limit contact with others Please continue to rest and drink plenty of fluids May use Tessalon every 8 hours as needed for cough Begin daily cetirizine to help with congestion and drainage that may be further irritating throat Continue to use inhaler as needed for shortness of breath, wheezing, chest tightness Please follow-up if your symptoms worsen, develop fevers, worsening shortness of breath, persistent symptoms without improvement in another 4 to 5 days    ED Prescriptions    Medication Sig Dispense Auth. Provider   benzonatate (TESSALON) 200 MG capsule Take 1 capsule (200 mg total) by mouth 3 (three) times daily as needed for up to 7 days for cough. 28 capsule Arnetra Terris C, PA-C   Cetirizine HCl 10 MG CAPS Take 1  capsule (10 mg total) by mouth daily. 15 capsule Mai Longnecker C, PA-C   cephALEXin (KEFLEX) 500 MG capsule Take 1 capsule (500 mg total) by mouth 4 (four) times daily for 5 days. 20 capsule Kiaan Overholser C, PA-C   albuterol (VENTOLIN HFA) 108 (90 Base) MCG/ACT inhaler Inhale 1-2 puffs into the lungs every 6 (six) hours as needed  for wheezing or shortness of breath. 1 Inhaler Junetta Hearn C, PA-C     Controlled Substance Prescriptions [place] Controlled Substance Registry consulted? Not Applicable   Janith Lima, [place] 04/05/19 1007

## 2019-04-04 NOTE — Discharge Instructions (Signed)
No pneumonia on chest x-ray, there will be a radiologist that will look at this as well and if they were to see anything different I will call you Your symptoms are most likely coronavirus Please stay home until symptoms fully resolve, limit contact with others Please continue to rest and drink plenty of fluids May use Tessalon every 8 hours as needed for cough Begin daily cetirizine to help with congestion and drainage that may be further irritating throat Continue to use inhaler as needed for shortness of breath, wheezing, chest tightness Please follow-up if your symptoms worsen, develop fevers, worsening shortness of breath, persistent symptoms without improvement in another 4 to 5 days

## 2019-04-04 NOTE — ED Triage Notes (Signed)
Per pt she has been having cough, congestion, chills, sore throat and body aches for about 1 week. Has been taking otc but no relief with cough. Pt said no fevers  but no energy.

## 2019-04-11 ENCOUNTER — Encounter (HOSPITAL_COMMUNITY): Payer: Self-pay | Admitting: Emergency Medicine

## 2019-04-11 ENCOUNTER — Other Ambulatory Visit: Payer: Self-pay

## 2019-04-11 ENCOUNTER — Ambulatory Visit (HOSPITAL_COMMUNITY)
Admission: EM | Admit: 2019-04-11 | Discharge: 2019-04-11 | Disposition: A | Discharge status: Home / Self Care | Payer: Self-pay | Attending: Family Medicine | Admitting: Family Medicine

## 2019-04-11 DIAGNOSIS — R197 Diarrhea, unspecified: Secondary | ICD-10-CM

## 2019-04-11 DIAGNOSIS — T887XXA Unspecified adverse effect of drug or medicament, initial encounter: Secondary | ICD-10-CM

## 2019-04-11 DIAGNOSIS — R112 Nausea with vomiting, unspecified: Secondary | ICD-10-CM

## 2019-04-11 DIAGNOSIS — Z0289 Encounter for other administrative examinations: Secondary | ICD-10-CM

## 2019-04-11 NOTE — ED Provider Notes (Addendum)
[facility]   [id number] 04/11/19 Arrival Time: 5852  CC: Nausea, vomiting and diarrhea  SUBJECTIVE:  Barbara Lam is a 28 y.o. female hx significant for asthma, and HTN, who presents with nausea, vomiting x 2-3 episodes, and watery diarrhea x 2-3 episodes daily, for the past week.  Was seen at [facility] and presumed to have COVID based on symptoms.  Was not tested.  Given a prescription for keflex for possible upper eyelid infection.  Patient states symptoms began after she began taking keflex. Reports improvement in cough with prescribed medications.    Denies fever, chills, chest pain, SOB, constipation, hematochezia, melena, dysuria, difficulty urinating, increased frequency or urgency, flank pain, loss of bowel or bladder function.  Patient requests work note.    Patient's last menstrual period was 03/18/2019.  ROS: As per HPI.  Past Medical History:  Diagnosis Date  . Asthma   . Chronic headaches   . Hypertension    Past Surgical History:  Procedure Laterality Date  . EYE SURGERY     Allergies  Allergen Reactions  . Lisinopril   . Tomato Hives   No current facility-administered medications on file prior to encounter.    Current Outpatient Medications on File Prior to Encounter  Medication Sig Dispense Refill  . acetaminophen-codeine (TYLENOL #2) 300-15 MG tablet Take 1 tablet by mouth every 4 (four) hours as needed for moderate pain.    Marland Kitchen albuterol (VENTOLIN HFA) 108 (90 Base) MCG/ACT inhaler Inhale 1-2 puffs into the lungs every 6 (six) hours as needed for wheezing or shortness of breath. 1 Inhaler 0  . amLODipine (NORVASC) 10 MG tablet Take 1 tablet (10 mg total) by mouth daily. 30 tablet 6  . benzonatate (TESSALON) 200 MG capsule Take 1 capsule (200 mg total) by mouth 3 (three) times daily as needed for up to 7 days for cough. 28 capsule 0  . Cetirizine HCl 10 MG CAPS Take 1 capsule (10 mg total) by mouth daily. 15 capsule 0  . cyclobenzaprine (FLEXERIL) 10  MG tablet Take 1 tablet (10 mg total) by mouth 3 (three) times daily as needed. 21 tablet 0  . ibuprofen (ADVIL,MOTRIN) 800 MG tablet Take 1 tablet (800 mg total) by mouth every 6 (six) hours as needed for moderate pain. 20 tablet 0  . medroxyPROGESTERone (PROVERA) 10 MG tablet Take 10 mg by mouth See admin instructions. Take 10 mg by mouth two times a day until bleeding stops then take remaining tablets once a day until finished  0  . methocarbamol (ROBAXIN) 500 MG tablet Take 1 tablet (500 mg total) by mouth every 8 (eight) hours as needed for muscle spasms. 20 tablet 0  . ondansetron (ZOFRAN-ODT) 8 MG disintegrating tablet Take 1 tablet (8 mg total) by mouth every 8 (eight) hours as needed for nausea. 12 tablet 0  . triamcinolone cream (KENALOG) 0.1 % Apply 1 application topically 2 (two) times daily. 30 g 0   Social History   Socioeconomic History  . Marital status: Single    Spouse name: Not on file  . Number of children: Not on file  . Years of education: Not on file  . Highest education level: Not on file  Occupational History  . Not on file  Social Needs  . Financial resource strain: Not on file  . Food insecurity:    Worry: Never true    Inability: Often true  . Transportation needs:    Medical: No    Non-medical: No  Tobacco Use  . Smoking status: Current Some Day Smoker    Years: 3.00    Types: Cigars  . Smokeless tobacco: Never Used  Substance and Sexual Activity  . Alcohol use: Yes    Comment: Socially   . Drug use: No  . Sexual activity: Not Currently    Birth control/protection: Pill  Lifestyle  . Physical activity:    Days per week: Not on file    Minutes per session: Not on file  . Stress: Not on file  Relationships  . Social connections:    Talks on phone: Not on file    Gets together: Not on file    Attends religious service: Not on file    Active member of club or organization: Not on file    Attends meetings of clubs or organizations: Not on file     Relationship status: Not on file  . Intimate partner violence:    Fear of current or ex partner: Not on file    Emotionally abused: Not on file    Physically abused: Not on file    Forced sexual activity: Not on file  Other Topics Concern  . Not on file  Social History Narrative  . Not on file   Family History  Problem Relation Age of Onset  . Diabetes Other      OBJECTIVE:  Vitals:   04/11/19 1802 04/11/19 1804  BP:  120/83  Pulse: 100   Resp: 16   Temp: 98.3 F (36.8 C)   SpO2: 97%     General appearance: Alert; appears mildly fatigued laying on exam table HEENT: NCAT.  Oropharynx clear.  Lungs: clear to auscultation bilaterally without adventitious breath sounds Heart: regular rate and rhythm.  Radial pulses 2+ symmetrical bilaterally Abdomen: soft, non-distended; normal active bowel sounds; non-tender to light and deep palpation; nontender at McBurney's point; no guarding Back: no CVA tenderness Extremities: no edema; symmetrical with no gross deformities Skin: warm and dry Neurologic: normal gait Psychological: alert and cooperative; normal mood and affect  ASSESSMENT & PLAN:  1. Nausea vomiting and diarrhea   2. Medication side effect     Push fluids.  Supplement with OTC pedialyte as needed Continue zofran as needed for nausea Continue with keflex as prescribed and to completion Follow up with PCP if diarrhea persists after antibiotic is completed.  You may need to have stool studies done at that time.   CALL or go to the ED if you have any new or worsening symptoms such as fever, chills, nausea, vomiting, persistent diarrhea, bloody or dark tarry stools, constipation, urinary symptoms, worsening abdominal discomfort,  inability to keep fluids down, worsening cough, chest pain, chest pressure, etc...   Reviewed expectations re: course of current medical issues. Questions answered. Outlined signs and symptoms indicating need for more acute intervention.  Patient verbalized understanding. After Visit Summary given.   Garyn, [place], PA-C 04/11/19 Laurie, [place], PA-C 04/11/19 1831

## 2019-04-11 NOTE — Discharge Instructions (Signed)
Push fluids.  Supplement with OTC pedialyte as needed Continue zofran as needed for nausea Continue with keflex as prescribed and to completion Follow up with PCP if diarrhea persists after antibiotic is completed.  You may need to have stool studies done at that time.   CALL or go to the ED if you have any new or worsening symptoms such as fever, chills, nausea, vomiting, persistent diarrhea, bloody or dark tarry stools, constipation, urinary symptoms, worsening abdominal discomfort,  inability to keep fluids down, worsening cough, chest pain, chest pressure, etc..Marland Kitchen

## 2019-04-11 NOTE — ED Triage Notes (Signed)
Pt states shes been vomiting with diarrhea, states sh ewas given zofran that helps but has not tried anything, states shes had diarrhea all this week. Pt states her inhaler and tessalon are helping her cough.

## 2019-04-14 ENCOUNTER — Encounter (HOSPITAL_COMMUNITY): Payer: Self-pay | Admitting: *Deleted

## 2019-04-14 ENCOUNTER — Other Ambulatory Visit: Payer: Self-pay

## 2019-04-14 ENCOUNTER — Emergency Department (HOSPITAL_COMMUNITY)
Admission: EM | Admit: 2019-04-14 | Discharge: 2019-04-14 | Disposition: A | Discharge status: Home / Self Care | Payer: Self-pay | Attending: Emergency Medicine | Admitting: Emergency Medicine

## 2019-04-14 DIAGNOSIS — Z79899 Other long term (current) drug therapy: Secondary | ICD-10-CM | POA: Insufficient documentation

## 2019-04-14 DIAGNOSIS — F17290 Nicotine dependence, other tobacco product, uncomplicated: Secondary | ICD-10-CM | POA: Insufficient documentation

## 2019-04-14 DIAGNOSIS — J45909 Unspecified asthma, uncomplicated: Secondary | ICD-10-CM | POA: Insufficient documentation

## 2019-04-14 DIAGNOSIS — R6889 Other general symptoms and signs: Secondary | ICD-10-CM

## 2019-04-14 DIAGNOSIS — J101 Influenza due to other identified influenza virus with other respiratory manifestations: Secondary | ICD-10-CM | POA: Insufficient documentation

## 2019-04-14 DIAGNOSIS — I10 Essential (primary) hypertension: Secondary | ICD-10-CM | POA: Insufficient documentation

## 2019-04-14 NOTE — ED Triage Notes (Signed)
Pt states she was seen at urgent care last Saturday and was told she has all the sx for covid 19 pt states 2 days ago she still continues to have a headache, with chills and body aches

## 2019-04-14 NOTE — ED Notes (Signed)
Pt c/o headache and bodyaches for week. Was seen by urgent care and told to self quartine.

## 2019-04-14 NOTE — Discharge Instructions (Addendum)
Symptoms now getting to be a little prolonged to be COVID-19 infection but not impossible.  Formal COVID-19 testing is still reserved for people requiring admission to the hospital.  Make the assumption that you may have the infection continue to self quarantine.  Additional work note provided to take you out of work through May 1.  It is important that you not return to work until you have no symptoms for 3 days.  Return for any new or worse symptoms.  Return for any shortness of breath.

## 2019-04-14 NOTE — ED Provider Notes (Signed)
[facility] EMERGENCY DEPARTMENT Provider Note   CSN: [id number] Arrival date & time: 04/14/19  1904    History   Chief Complaint Chief Complaint  Patient presents with  . Influenza    HPI Barbara Lam is a 28 y.o. female.     Patient with persistent flulike symptoms but no shortness of breath or any worse breathing problems.  Patient symptoms started around April 12.  She was seen in urgent care on April 18 and again on April 25.  Had a chest x-ray on April 18 which was negative.  Patient is concerned that she may have COVID-19.  Patient states she still having chills and body ache and having a mild headache.  No longer having fevers.     Past Medical History:  Diagnosis Date  . Asthma   . Chronic headaches   . Hypertension     There are no active problems to display for this patient.   Past Surgical History:  Procedure Laterality Date  . EYE SURGERY       OB History    Gravida  0   Para  0   Term  0   Preterm      AB      Living        SAB      TAB      Ectopic      Multiple      Live Births               Home Medications    Prior to Admission medications   Medication Sig Start Date End Date Taking? Authorizing Provider  acetaminophen-codeine (TYLENOL #2) 300-15 MG tablet Take 1 tablet by mouth every 4 (four) hours as needed for moderate pain.    [provider]  albuterol (VENTOLIN HFA) 108 (90 Base) MCG/ACT inhaler Inhale 1-2 puffs into the lungs every 6 (six) hours as needed for wheezing or shortness of breath. 04/04/19   Wieters, Hallie C, PA-C  amLODipine (NORVASC) 10 MG tablet Take 1 tablet (10 mg total) by mouth daily. 09/18/18   Estill Dooms, NP  Cetirizine HCl 10 MG CAPS Take 1 capsule (10 mg total) by mouth daily. 04/04/19   Wieters, Hallie C, PA-C  cyclobenzaprine (FLEXERIL) 10 MG tablet Take 1 tablet (10 mg total) by mouth 3 (three) times daily as needed. 10/20/18   Triplett, Tammy, PA-C  ibuprofen  (ADVIL,MOTRIN) 800 MG tablet Take 1 tablet (800 mg total) by mouth every 6 (six) hours as needed for moderate pain. 03/09/19   Orpah Greek, MD  medroxyPROGESTERone (PROVERA) 10 MG tablet Take 10 mg by mouth See admin instructions. Take 10 mg by mouth two times a day until bleeding stops then take remaining tablets once a day until finished 06/26/18   [provider]  methocarbamol (ROBAXIN) 500 MG tablet Take 1 tablet (500 mg total) by mouth every 8 (eight) hours as needed for muscle spasms. 03/09/19   Orpah Greek, MD  ondansetron (ZOFRAN-ODT) 8 MG disintegrating tablet Take 1 tablet (8 mg total) by mouth every 8 (eight) hours as needed for nausea. 02/22/19   Robyn Haber, MD  triamcinolone cream (KENALOG) 0.1 % Apply 1 application topically 2 (two) times daily. 02/02/19   Rosemarie Ax, MD    Family History Family History  Problem Relation Age of Onset  . Diabetes Other     Social History Social History   Tobacco Use  . Smoking status: Current Some  Day Smoker    Years: 3.00    Types: Cigars  . Smokeless tobacco: Never Used  Substance Use Topics  . Alcohol use: Yes    Comment: Socially   . Drug use: No     Allergies   Lisinopril and Tomato   Review of Systems Review of Systems  Constitutional: Positive for chills. Negative for fever.  HENT: Negative for congestion, rhinorrhea and sore throat.   Eyes: Negative for visual disturbance.  Respiratory: Negative for cough and shortness of breath.   Cardiovascular: Negative for chest pain and leg swelling.  Gastrointestinal: Negative for abdominal pain, diarrhea, nausea and vomiting.  Genitourinary: Negative for dysuria.  Musculoskeletal: Positive for myalgias. Negative for back pain and neck pain.  Skin: Negative for rash.  Neurological: Positive for headaches. Negative for dizziness and light-headedness.  Hematological: Does not bruise/bleed easily.  Psychiatric/Behavioral: Negative for  confusion.     Physical Exam Updated Vital Signs BP 120/90 (BP Location: Right Arm)   Pulse 97   Temp 98.2 F (36.8 C) (Oral)   Resp 16   Ht 1.6 m (5\' 3" )   Wt 63.5 kg   LMP 03/18/2019   SpO2 99%   BMI 24.80 kg/m   Physical Exam Vitals signs and nursing note reviewed.  Constitutional:      General: She is not in acute distress.    Appearance: Normal appearance. She is well-developed.  HENT:     Head: Normocephalic and atraumatic.     Mouth/Throat:     Mouth: Mucous membranes are moist.  Eyes:     Extraocular Movements: Extraocular movements intact.     Conjunctiva/sclera: Conjunctivae normal.     Pupils: Pupils are equal, round, and reactive to light.  Neck:     Musculoskeletal: Normal range of motion and neck supple. No neck rigidity.  Cardiovascular:     Rate and Rhythm: Normal rate and regular rhythm.     Heart sounds: No murmur.  Pulmonary:     Effort: Pulmonary effort is normal. No respiratory distress.     Breath sounds: Normal breath sounds. No wheezing.  Abdominal:     Palpations: Abdomen is soft.     Tenderness: There is no abdominal tenderness.  Musculoskeletal: Normal range of motion.        General: No swelling.  Skin:    General: Skin is warm and dry.     Capillary Refill: Capillary refill takes less than 2 seconds.  Neurological:     General: No focal deficit present.     Mental Status: She is alert and oriented to person, place, and time.      ED Treatments / Results  Labs (all labs ordered are listed, but only abnormal results are displayed) Labs Reviewed - No data to display  EKG None  Radiology No results found.  Procedures Procedures (including critical care time)  Medications Ordered in ED Medications - No data to display   Initial Impression / Assessment and Plan / ED Course  I have reviewed the triage vital signs and the nursing notes.  Pertinent labs & imaging results that were available during my care of the patient  were reviewed by me and considered in my medical decision making (see chart for details).      Patient symptoms not inconsistent with COVID-19 at this age specifically since the onset of this around April 12.  Patient in no respiratory distress now no hypoxia no fevers.  Still having some flulike symptoms.  Unfortunately were  unable to test unless admission is required.  Patient understands.  Chest x-ray repeat does not seem to be necessary she had a negative one on the 18th she is saying that she really is having no short of breath at all.  We will continue to self quarantine work note provided.  Patient instructed to at least be asymptomatic for 3 days prior to returning to work.  Nontoxic no acute distress.    Final Clinical Impressions(s) / ED Diagnoses   Final diagnoses:  Flu-like symptoms    ED Discharge Orders    None       Fredia Sorrow, MD 04/14/19 2038

## 2019-04-27 ENCOUNTER — Encounter (HOSPITAL_COMMUNITY): Payer: Self-pay | Admitting: *Deleted

## 2019-04-27 ENCOUNTER — Ambulatory Visit (HOSPITAL_COMMUNITY)
Admission: EM | Admit: 2019-04-27 | Discharge: 2019-04-27 | Disposition: A | Discharge status: Home / Self Care | Payer: Self-pay | Attending: Family Medicine | Admitting: Family Medicine

## 2019-04-27 ENCOUNTER — Other Ambulatory Visit: Payer: Self-pay

## 2019-04-27 DIAGNOSIS — J22 Unspecified acute lower respiratory infection: Secondary | ICD-10-CM

## 2019-04-27 DIAGNOSIS — K047 Periapical abscess without sinus: Secondary | ICD-10-CM

## 2019-04-27 MED ORDER — AMOXICILLIN-POT CLAVULANATE 875-125 MG PO TABS: 1.0000 | ORAL_TABLET | Freq: Two times a day (BID) | ORAL | 0 refills | Status: AC

## 2019-04-27 MED ORDER — CETIRIZINE HCL 10 MG PO CAPS: 10.0000 mg | ORAL_CAPSULE | Freq: Every day | ORAL | 0 refills | Status: DC

## 2019-04-27 MED ORDER — AZITHROMYCIN 250 MG PO TABS: 250.0000 mg | ORAL_TABLET | Freq: Every day | ORAL | 0 refills | Status: DC

## 2019-04-27 MED ORDER — BENZONATATE 200 MG PO CAPS: 200.0000 mg | ORAL_CAPSULE | Freq: Three times a day (TID) | ORAL | 0 refills | Status: AC | PRN

## 2019-04-27 NOTE — ED Provider Notes (Signed)
[place]    CSN: [id number] Arrival date & time: 04/27/19  1654     History   Chief Complaint Chief Complaint  Patient presents with  . Cough    HPI ASHAYLA SUBIA is a 28 y.o. female history of asthma, hypertension, presenting today for evaluation of a cough.  Patient has had a cough for the past month.  She notes that she has had intermittent fevers.  Had a fever of 102 203 yesterday, but this is not been persistent until today.  She was seen here approximately 1 month ago and treated for viral URI, possible COVID.  Recommend symptomatic and supportive care.  Has been using albuterol and Tessalon which is helped some, but symptoms have persisted.  Denies worsening of symptoms.  Cough is occasionally productive.  Also notes to have some occasional wheezing and shortness of breath.  Mild sore throat, minimal rhinorrhea.  Tolerating oral intake.  Patient has also noted some facial swelling to her left lower jaw and some dental pain that is developed over the past 2 days.  HPI  Past Medical History:  Diagnosis Date  . Asthma   . Chronic headaches   . Hypertension     There are no active problems to display for this patient.   Past Surgical History:  Procedure Laterality Date  . EYE SURGERY      OB History    Gravida  0   Para  0   Term  0   Preterm      AB      Living        SAB      TAB      Ectopic      Multiple      Live Births               Home Medications    Prior to Admission medications   Medication Sig Start Date End Date Taking? Authorizing Provider  albuterol (VENTOLIN HFA) 108 (90 Base) MCG/ACT inhaler Inhale 1-2 puffs into the lungs every 6 (six) hours as needed for wheezing or shortness of breath. 04/04/19  Yes ,  C, PA-C  amLODipine (NORVASC) 10 MG tablet Take 1 tablet (10 mg total) by mouth daily. 09/18/18  Yes Estill Dooms, NP  medroxyPROGESTERone Acetate (DEPO-PROVERA IM) Inject into the muscle.    Yes [provider]  acetaminophen-codeine (TYLENOL #2) 300-15 MG tablet Take 1 tablet by mouth every 4 (four) hours as needed for moderate pain.    [provider]  amoxicillin-clavulanate (AUGMENTIN) 875-125 MG tablet Take 1 tablet by mouth every 12 (twelve) hours for 7 days. 04/27/19 05/04/19  ,  C, PA-C  azithromycin (ZITHROMAX) 250 MG tablet Take 1 tablet (250 mg total) by mouth daily. Take first 2 tablets together, then 1 every day until finished. 04/27/19   ,  C, PA-C  benzonatate (TESSALON) 200 MG capsule Take 1 capsule (200 mg total) by mouth 3 (three) times daily as needed for up to 7 days for cough. 04/27/19 05/04/19  ,  C, PA-C  Cetirizine HCl 10 MG CAPS Take 1 capsule (10 mg total) by mouth daily for 10 days. 04/27/19 05/07/19  ,  C, PA-C  cyclobenzaprine (FLEXERIL) 10 MG tablet Take 1 tablet (10 mg total) by mouth 3 (three) times daily as needed. 10/20/18   Triplett, Tammy, PA-C  ibuprofen (ADVIL,MOTRIN) 800 MG tablet Take 1 tablet (800 mg total) by mouth every 6 (six) hours as needed  for moderate pain. 03/09/19   Orpah Greek, MD  medroxyPROGESTERone (PROVERA) 10 MG tablet Take 10 mg by mouth See admin instructions. Take 10 mg by mouth two times a day until bleeding stops then take remaining tablets once a day until finished 06/26/18   [provider]  methocarbamol (ROBAXIN) 500 MG tablet Take 1 tablet (500 mg total) by mouth every 8 (eight) hours as needed for muscle spasms. 03/09/19   Orpah Greek, MD  ondansetron (ZOFRAN-ODT) 8 MG disintegrating tablet Take 1 tablet (8 mg total) by mouth every 8 (eight) hours as needed for nausea. 02/22/19   Robyn Haber, MD  triamcinolone cream (KENALOG) 0.1 % Apply 1 application topically 2 (two) times daily. 02/02/19   Rosemarie Ax, MD    Family History Family History  Problem Relation Age of Onset  . Diabetes Other     Social History Social  History   Tobacco Use  . Smoking status: Current Some Day Smoker    Years: 3.00    Types: Cigars  . Smokeless tobacco: Never Used  Substance Use Topics  . Alcohol use: Yes    Comment: Socially   . Drug use: No     Allergies   Lisinopril and Tomato   Review of Systems Review of Systems  Constitutional: Positive for fatigue and fever. Negative for activity change, appetite change and chills.  HENT: Positive for congestion and sore throat. Negative for ear pain, rhinorrhea, sinus pressure and trouble swallowing.   Eyes: Negative for discharge and redness.  Respiratory: Positive for cough and wheezing. Negative for chest tightness and shortness of breath.   Cardiovascular: Negative for chest pain.  Gastrointestinal: Negative for abdominal pain, diarrhea, nausea and vomiting.  Musculoskeletal: Negative for myalgias.  Skin: Negative for rash.  Neurological: Negative for dizziness, light-headedness and headaches.     Physical Exam Triage Vital Signs ED Triage Vitals  Enc Vitals Group     BP 04/27/19 1706 136/87     Pulse Rate 04/27/19 1706 99     Resp --      Temp 04/27/19 1706 98 F (36.7 C)     Temp Source 04/27/19 1706 Temporal     SpO2 04/27/19 1706 100 %     Weight --      Height --      Head Circumference --      Peak Flow --      Pain Score 04/27/19 1708 10     Pain Loc --      Pain Edu? --      Excl. in [place]? --    No data found.  Updated Vital Signs BP 136/87   Pulse 99   Temp 98 F (36.7 C) (Temporal)   SpO2 100%   Visual Acuity Right Eye Distance:   Left Eye Distance:   Bilateral Distance:    Right Eye Near:   Left Eye Near:    Bilateral Near:     Physical Exam Vitals signs and nursing note reviewed.  Constitutional:      General: She is not in acute distress.    Appearance: She is well-developed.  HENT:     Head: Normocephalic and atraumatic.     Comments: Mild facial swelling to left lower jaw, mild tenderness to palpation, no overlying  erythema    Ears:     Comments: Bilateral ears without tenderness to palpation of external auricle, tragus and mastoid, EAC's without erythema or swelling, TM's with good bony landmarks  and cone of light. Non erythematous.    Mouth/Throat:     Comments: Oral mucosa pink and moist, no tonsillar enlargement or exudate. Posterior pharynx patent and nonerythematous, no uvula deviation or swelling. Normal phonation.  Posterior molar and left lower jaw appears slightly impacted with surrounding gingival erythema and tenderness, no soft palate swelling Eyes:     Conjunctiva/sclera: Conjunctivae normal.  Neck:     Musculoskeletal: Neck supple.  Cardiovascular:     Rate and Rhythm: Normal rate and regular rhythm.     Heart sounds: No murmur.  Pulmonary:     Effort: Pulmonary effort is normal. No respiratory distress.     Breath sounds: Normal breath sounds.     Comments: Breathing comfortably at rest, CTABL, no wheezing, rales or other adventitious sounds auscultated Abdominal:     Palpations: Abdomen is soft.     Tenderness: There is no abdominal tenderness.  Skin:    General: Skin is warm and dry.  Neurological:     Mental Status: She is alert.      UC Treatments / Results  Labs (all labs ordered are listed, but only abnormal results are displayed) Labs Reviewed - No data to display  EKG None  Radiology No results found.  Procedures Procedures (including critical care time)  Medications Ordered in UC Medications - No data to display  Initial Impression / Assessment and Plan / UC Course  I have reviewed the triage vital signs and the nursing notes.  Pertinent labs & imaging results that were available during my care of the patient were reviewed by me and considered in my medical decision making (see chart for details).    Patient with cough x1 month, previous chest x-ray negative without recent worsening of symptoms.  Vital signs stable without fever, tachycardia or  hypoxia.  Lungs clear to auscultation.  Placing patient on antibiotics empirically, will defer further x-ray/radiation.  Will initiate on Augmentin to cover for dental infection/underlying pneumonia as well as azithromycin to cover atypicals given length of cough.  Continue Tessalon as needed for cough, daily cetirizine to help with any drainage.  Continue to rest.  Provided contact information for [place] for COVID testing as patient has been out of work over the past month.  Advised to continue to remain home from work until symptom and fever free for 3 days.  Patient stable upon discharge, does not seem to warrant emergent work-up at this time.  Continue to monitor temperature, breathing and cough,Discussed strict return precautions. Patient verbalized understanding and is agreeable with plan.  Final Clinical Impressions(s) / UC Diagnoses   Final diagnoses:  Lower respiratory infection (e.g., bronchitis, pneumonia, pneumonitis, pulmonitis)  Dental infection     Discharge Instructions     Please begin Augmentin twice daily for the next week-this will cover any dental infection causing her facial swelling, but will also cover pneumonia Please begin azithromycin-2 tablets today, 1 tablet for the following 4 days to cover further respiratory infection Continue Tessalon as needed every 8 hours Daily cetirizine to help with any drainage/symptoms related to allergies Continue to rest and drink plenty of fluids  Follow-up if developing fevers, shortness of breath, difficulty breathing, worsening symptoms May return to work when fever free and symptom free for 3 days.   ED Prescriptions    Medication Sig Dispense Auth. Provider   azithromycin (ZITHROMAX) 250 MG tablet Take 1 tablet (250 mg total) by mouth daily. Take first 2 tablets together, then 1 every  day until finished. 6 tablet ,  C, PA-C   amoxicillin-clavulanate (AUGMENTIN) 875-125 MG tablet Take 1  tablet by mouth every 12 (twelve) hours for 7 days. 14 tablet ,  C, PA-C   benzonatate (TESSALON) 200 MG capsule Take 1 capsule (200 mg total) by mouth 3 (three) times daily as needed for up to 7 days for cough. 28 capsule ,  C, PA-C   Cetirizine HCl 10 MG CAPS Take 1 capsule (10 mg total) by mouth daily for 10 days. 10 capsule ,  C, PA-C     Controlled Substance Prescriptions [place] Controlled Substance Registry consulted? Not Applicable   Janith Lima, [place] 04/27/19 1759

## 2019-04-27 NOTE — Discharge Instructions (Signed)
Please begin Augmentin twice daily for the next week-this will cover any dental infection causing her facial swelling, but will also cover pneumonia Please begin azithromycin-2 tablets today, 1 tablet for the following 4 days to cover further respiratory infection Continue Tessalon as needed every 8 hours Daily cetirizine to help with any drainage/symptoms related to allergies Continue to rest and drink plenty of fluids  Follow-up if developing fevers, shortness of breath, difficulty breathing, worsening symptoms May return to work when fever free and symptom free for 3 days.

## 2019-04-27 NOTE — ED Triage Notes (Signed)
C/O mostly dry cough x 2 wks.  Yesterday started with fever up to "102-103" per home thermometer.  C/O nausea.  C/O back discomfort/achiness.

## 2019-05-08 ENCOUNTER — Other Ambulatory Visit: Payer: Self-pay

## 2019-05-08 ENCOUNTER — Encounter (HOSPITAL_COMMUNITY): Payer: Self-pay | Admitting: Emergency Medicine

## 2019-05-08 ENCOUNTER — Ambulatory Visit (HOSPITAL_COMMUNITY)
Admission: EM | Admit: 2019-05-08 | Discharge: 2019-05-08 | Disposition: A | Discharge status: Home / Self Care | Payer: Self-pay | Attending: Internal Medicine | Admitting: Internal Medicine

## 2019-05-08 DIAGNOSIS — H9202 Otalgia, left ear: Secondary | ICD-10-CM

## 2019-05-08 MED ORDER — IBUPROFEN 800 MG PO TABS: 800.0000 mg | ORAL_TABLET | Freq: Four times a day (QID) | ORAL | 0 refills | Status: DC | PRN

## 2019-05-08 NOTE — ED Triage Notes (Signed)
Pt here for left ear pain

## 2019-05-08 NOTE — ED Notes (Signed)
Patient able to ambulate independently  

## 2019-05-08 NOTE — ED Provider Notes (Signed)
[name]    CSN: [id number] Arrival date & time: 05/08/19  1615     History   Chief Complaint Chief Complaint  Patient presents with  . Otalgia    HPI Barbara Lam is a 28 y.o. female was recently treated for pneumonia and dental infection comes to urgent care with complaints of left ear pain.   Patient has been ill with cough for over a month.  She has been without any febrile episodes since her last visit.  She denies any loss of hearing.  Patient apparently has wisdom tooth on the left side which has been giving patient a lot of pain in the past.  This pain is severe.  Is associated with opening her mouth.  Pain is throbbing.  No relieving factors.  No fever, ear discharge, ringing in the ears.  HPI  Past Medical History:  Diagnosis Date  . Asthma   . Chronic headaches   . Hypertension     There are no active problems to display for this patient.   Past Surgical History:  Procedure Laterality Date  . EYE SURGERY      OB History    Gravida  0   Para  0   Term  0   Preterm      AB      Living        SAB      TAB      Ectopic      Multiple      Live Births               Home Medications    Prior to Admission medications   Medication Sig Start Date End Date Taking? Authorizing Provider  acetaminophen-codeine (TYLENOL #2) 300-15 MG tablet Take 1 tablet by mouth every 4 (four) hours as needed for moderate pain.    [provider]  albuterol (VENTOLIN HFA) 108 (90 Base) MCG/ACT inhaler Inhale 1-2 puffs into the lungs every 6 (six) hours as needed for wheezing or shortness of breath. 04/04/19   Wieters, Hallie C, PA-C  amLODipine (NORVASC) 10 MG tablet Take 1 tablet (10 mg total) by mouth daily. 09/18/18   Estill Dooms, NP  azithromycin (ZITHROMAX) 250 MG tablet Take 1 tablet (250 mg total) by mouth daily. Take first 2 tablets together, then 1 every day until finished. 04/27/19   Wieters, Hallie C, PA-C  Cetirizine HCl  10 MG CAPS Take 1 capsule (10 mg total) by mouth daily for 10 days. 04/27/19 05/07/19  Wieters, Hallie C, PA-C  cyclobenzaprine (FLEXERIL) 10 MG tablet Take 1 tablet (10 mg total) by mouth 3 (three) times daily as needed. 10/20/18   Triplett, Tammy, PA-C  ibuprofen (ADVIL) 800 MG tablet Take 1 tablet (800 mg total) by mouth every 6 (six) hours as needed for moderate pain. 05/08/19   Chase Picket, MD  medroxyPROGESTERone (PROVERA) 10 MG tablet Take 10 mg by mouth See admin instructions. Take 10 mg by mouth two times a day until bleeding stops then take remaining tablets once a day until finished 06/26/18   [provider]  medroxyPROGESTERone Acetate (DEPO-PROVERA IM) Inject into the muscle.    [provider]  methocarbamol (ROBAXIN) 500 MG tablet Take 1 tablet (500 mg total) by mouth every 8 (eight) hours as needed for muscle spasms. 03/09/19   Orpah Greek, MD  ondansetron (ZOFRAN-ODT) 8 MG disintegrating tablet Take 1 tablet (8 mg total) by mouth every 8 (eight)  hours as needed for nausea. 02/22/19   Robyn Haber, MD  triamcinolone cream (KENALOG) 0.1 % Apply 1 application topically 2 (two) times daily. 02/02/19   Rosemarie Ax, MD    Family History Family History  Problem Relation Age of Onset  . Diabetes Other     Social History Social History   Tobacco Use  . Smoking status: Current Some Day Smoker    Years: 3.00    Types: Cigars  . Smokeless tobacco: Never Used  Substance Use Topics  . Alcohol use: Yes    Comment: Socially   . Drug use: No     Allergies   Lisinopril and Tomato   Review of Systems Review of Systems  Constitutional: Negative.   HENT: Positive for ear discharge and ear pain. Negative for sinus pressure, sinus pain and sore throat.   Eyes: Negative for pain, discharge and itching.  Respiratory: Negative.   Cardiovascular: Negative.   Gastrointestinal: Negative.   Neurological: Negative.      Physical Exam Triage Vital  Signs ED Triage Vitals  Enc Vitals Group     BP 05/08/19 1643 (!) 123/92     Pulse Rate 05/08/19 1643 (!) 102     Resp 05/08/19 1643 18     Temp 05/08/19 1643 98.5 F (36.9 C)     Temp Source 05/08/19 1643 Oral     SpO2 05/08/19 1643 97 %     Weight --      Height --      Head Circumference --      Peak Flow --      Pain Score 05/08/19 1647 8     Pain Loc --      Pain Edu? --      Excl. in [place]? --    No data found.  Updated Vital Signs BP (!) 123/92 (BP Location: Right Arm)   Pulse (!) 102   Temp 98.5 F (36.9 C) (Oral)   Resp 18   SpO2 97%   Visual Acuity Right Eye Distance:   Left Eye Distance:   Bilateral Distance:    Right Eye Near:   Left Eye Near:    Bilateral Near:     Physical Exam Constitutional:      Appearance: Normal appearance.  HENT:     Right Ear: Tympanic membrane normal.     Left Ear: Tympanic membrane normal.     Nose: Nose normal.     Mouth/Throat:     Mouth: Mucous membranes are moist.     Pharynx: No oropharyngeal exudate or posterior oropharyngeal erythema.  Eyes:     Conjunctiva/sclera: Conjunctivae normal.  Cardiovascular:     Rate and Rhythm: Normal rate and regular rhythm.  Pulmonary:     Effort: Pulmonary effort is normal.     Breath sounds: Normal breath sounds.  Abdominal:     General: Bowel sounds are normal.     Palpations: Abdomen is soft.  Skin:    Capillary Refill: Capillary refill takes less than 2 seconds.  Neurological:     Mental Status: She is alert.      UC Treatments / Results  Labs (all labs ordered are listed, but only abnormal results are displayed) Labs Reviewed - No data to display  EKG None  Radiology No results found.  Procedures Procedures (including critical care time)  Medications Ordered in UC Medications - No data to display  Initial Impression / Assessment and Plan / UC Course  I have reviewed  the triage vital signs and the nursing notes.  Pertinent labs & imaging results that  were available during my care of the patient were reviewed by me and considered in my medical decision making (see chart for details).     1.  Otalgia likely of dental origin: Ibuprofen 800 mg every 6 hours as needed. Patient is advised to follow-up with dentist for evaluation Return to work note given.  Patient to return to work on 05/09/2019.  2.  Recent treatment for community-acquired pneumonia. Final Clinical Impressions(s) / UC Diagnoses   Final diagnoses:  Left ear pain   Discharge Instructions   None    ED Prescriptions    Medication Sig Dispense Auth. Provider   ibuprofen (ADVIL) 800 MG tablet Take 1 tablet (800 mg total) by mouth every 6 (six) hours as needed for moderate pain. 20 tablet Angeleah Labrake, Myrene Galas, MD     Controlled Substance Prescriptions [place] Controlled Substance Registry consulted? No   Chase Picket, MD 05/08/19 [phone]

## 2019-05-24 ENCOUNTER — Ambulatory Visit (HOSPITAL_COMMUNITY)
Admission: EM | Admit: 2019-05-24 | Discharge: 2019-05-24 | Disposition: A | Discharge status: Home / Self Care | Payer: Self-pay | Attending: Family Medicine | Admitting: Family Medicine

## 2019-05-24 ENCOUNTER — Encounter (HOSPITAL_COMMUNITY): Payer: Self-pay | Admitting: Emergency Medicine

## 2019-05-24 ENCOUNTER — Other Ambulatory Visit: Payer: Self-pay

## 2019-05-24 DIAGNOSIS — B349 Viral infection, unspecified: Secondary | ICD-10-CM

## 2019-05-24 DIAGNOSIS — K047 Periapical abscess without sinus: Secondary | ICD-10-CM

## 2019-05-24 MED ORDER — AMOXICILLIN 500 MG PO CAPS: 1000.0000 mg | ORAL_CAPSULE | Freq: Two times a day (BID) | ORAL | 0 refills | Status: AC

## 2019-05-24 MED ORDER — BENZONATATE 100 MG PO CAPS: 100.0000 mg | ORAL_CAPSULE | Freq: Three times a day (TID) | ORAL | 0 refills | Status: DC

## 2019-05-24 NOTE — ED Triage Notes (Signed)
Onset 2 days ago of phlegm production.  Chest soreness with coughing, yellow/green phlegm.  Having hot and cold spells

## 2019-05-24 NOTE — ED Provider Notes (Signed)
[name]    CSN: [id number] Arrival date & time: 05/24/19  1050     History   Chief Complaint Chief Complaint  Patient presents with  . URI    HPI Barbara Lam is a 28 y.o. female.   Patient is a 28 year old female with past medical history of asthma, headaches, hypertension.  She presents today with approximate 2 days of cough, chest congestion and phlegm production.  Symptoms have been constant.  She has been using over-the-counter antihistamine and cough medication that was prescribed previously.  She has had some cold chills and body aches.  Denies any associated fever, chest pain or shortness of breath.  She is also having continued dental pain.  She has been taking the 800 mg ibuprofen with some relief.  Reporting some drainage from the gum area.  Worried of infection.  Denies any trismus or trouble swallowing.  Denies any fevers.  ROS per HPI      Past Medical History:  Diagnosis Date  . Asthma   . Chronic headaches   . Hypertension     There are no active problems to display for this patient.   Past Surgical History:  Procedure Laterality Date  . EYE SURGERY      OB History    Gravida  0   Para  0   Term  0   Preterm      AB      Living        SAB      TAB      Ectopic      Multiple      Live Births               Home Medications    Prior to Admission medications   Medication Sig Start Date End Date Taking? Authorizing Provider  albuterol (VENTOLIN HFA) 108 (90 Base) MCG/ACT inhaler Inhale 1-2 puffs into the lungs every 6 (six) hours as needed for wheezing or shortness of breath. 04/04/19  Yes Wieters, Hallie C, PA-C  amLODipine (NORVASC) 10 MG tablet Take 1 tablet (10 mg total) by mouth daily. 09/18/18  Yes Estill Dooms, NP  amoxicillin (AMOXIL) 500 MG capsule Take 2 capsules (1,000 mg total) by mouth 2 (two) times daily for 7 days. 05/24/19 05/31/19  Loura Halt A, NP  benzonatate (TESSALON) 100 MG capsule  Take 1 capsule (100 mg total) by mouth every 8 (eight) hours. 05/24/19   Loura Halt A, NP  cyclobenzaprine (FLEXERIL) 10 MG tablet Take 1 tablet (10 mg total) by mouth 3 (three) times daily as needed. 10/20/18   Triplett, Tammy, PA-C  ibuprofen (ADVIL) 800 MG tablet Take 1 tablet (800 mg total) by mouth every 6 (six) hours as needed for moderate pain. 05/08/19   LampteyMyrene Galas, MD  medroxyPROGESTERone Acetate (DEPO-PROVERA IM) Inject into the muscle.    [provider]  triamcinolone cream (KENALOG) 0.1 % Apply 1 application topically 2 (two) times daily. 02/02/19   Rosemarie Ax, MD    Family History Family History  Problem Relation Age of Onset  . Diabetes Other     Social History Social History   Tobacco Use  . Smoking status: Current Some Day Smoker    Years: 3.00    Types: Cigars  . Smokeless tobacco: Never Used  Substance Use Topics  . Alcohol use: Yes    Comment: Socially   . Drug use: No     Allergies   Lisinopril  and Tomato   Review of Systems Review of Systems   Physical Exam Triage Vital Signs ED Triage Vitals  Enc Vitals Group     BP 05/24/19 1118 131/89     Pulse Rate 05/24/19 1118 98     Resp 05/24/19 1118 18     Temp 05/24/19 1118 98.6 F (37 C)     Temp Source 05/24/19 1118 Oral     SpO2 05/24/19 1118 97 %     Weight --      Height --      Head Circumference --      Peak Flow --      Pain Score 05/24/19 1113 10     Pain Loc --      Pain Edu? --      Excl. in [place]? --    No data found.  Updated Vital Signs BP 131/89 (BP Location: Right Arm)   Pulse 98   Temp 98.6 F (37 C) (Oral)   Resp 18   SpO2 97%   Visual Acuity Right Eye Distance:   Left Eye Distance:   Bilateral Distance:    Right Eye Near:   Left Eye Near:    Bilateral Near:     Physical Exam Vitals signs and nursing note reviewed.  Constitutional:      General: She is not in acute distress.    Appearance: Normal appearance. She is not ill-appearing,  toxic-appearing or diaphoretic.  HENT:     Head: Normocephalic and atraumatic.     Right Ear: Tympanic membrane and ear canal normal.     Left Ear: Tympanic membrane and ear canal normal.     Nose: Congestion and rhinorrhea present.     Mouth/Throat:     Pharynx: Oropharynx is clear.  Eyes:     Conjunctiva/sclera: Conjunctivae normal.  Neck:     Musculoskeletal: Normal range of motion.  Cardiovascular:     Rate and Rhythm: Normal rate and regular rhythm.     Pulses: Normal pulses.     Heart sounds: Normal heart sounds.  Pulmonary:     Effort: Pulmonary effort is normal.     Breath sounds: Normal breath sounds.  Musculoskeletal: Normal range of motion.  Lymphadenopathy:     Cervical: No cervical adenopathy.  Skin:    General: Skin is warm and dry.  Neurological:     Mental Status: She is alert.  Psychiatric:        Mood and Affect: Mood normal.      UC Treatments / Results  Labs (all labs ordered are listed, but only abnormal results are displayed) Labs Reviewed - No data to display  EKG None  Radiology No results found.  Procedures Procedures (including critical care time)  Medications Ordered in UC Medications - No data to display  Initial Impression / Assessment and Plan / UC Course  I have reviewed the triage vital signs and the nursing notes.  Pertinent labs & imaging results that were available during my care of the patient were reviewed by me and considered in my medical decision making (see chart for details).    Viral illness  No concerning signs or symptoms on exam. Most likely some sort of viral illness Not convinced this is COVID.  She has been seen multiple times for respiratory type illnesses over the last couple months. We will refill the Tessalon Perles for cough and she can use her albuterol inhaler as needed  Dental pain Patient reporting she was unable  to get into the dentist last week due to them rescheduling.  She has been having  continued dental pain and drainage Will treat the dental infection with amoxicillin and she can continue the ibuprofen as needed for pain She plans to follow-up with a dentist next week  Final Clinical Impressions(s) / UC Diagnoses   Final diagnoses:  Chronic dental infection  Viral syndrome     Discharge Instructions     We are treating you for a dental infection. I believe that your other symptoms are related to some sort of virus No concern for pneumonia or bronchitis today. Refill on the Tessalon Perles to use as needed for cough.  You can use your albuterol inhaler as needed. Take the amoxicillin for the dental infection as prescribed and you can continue the ibuprofen as needed for pain Follow-up with a dentist as planned    ED Prescriptions    Medication Sig Dispense Auth. Provider   amoxicillin (AMOXIL) 500 MG capsule Take 2 capsules (1,000 mg total) by mouth 2 (two) times daily for 7 days. 28 capsule Yisel Megill A, NP   benzonatate (TESSALON) 100 MG capsule Take 1 capsule (100 mg total) by mouth every 8 (eight) hours. 21 capsule Loura Halt A, NP     Controlled Substance Prescriptions [place] Controlled Substance Registry consulted? Not Applicable   Orvan July, NP 05/24/19 1208

## 2019-05-24 NOTE — Discharge Instructions (Addendum)
We are treating you for a dental infection. I believe that your other symptoms are related to some sort of virus No concern for pneumonia or bronchitis today. Refill on the Tessalon Perles to use as needed for cough.  You can use your albuterol inhaler as needed. Take the amoxicillin for the dental infection as prescribed and you can continue the ibuprofen as needed for pain Follow-up with a dentist as planned

## 2019-06-01 ENCOUNTER — Other Ambulatory Visit: Payer: Self-pay

## 2019-06-01 ENCOUNTER — Encounter (HOSPITAL_COMMUNITY): Payer: Self-pay

## 2019-06-01 ENCOUNTER — Ambulatory Visit (HOSPITAL_COMMUNITY)
Admission: EM | Admit: 2019-06-01 | Discharge: 2019-06-01 | Disposition: A | Discharge status: Home / Self Care | Payer: Self-pay | Attending: Physician Assistant | Admitting: Physician Assistant

## 2019-06-01 ENCOUNTER — Ambulatory Visit (INDEPENDENT_AMBULATORY_CARE_PROVIDER_SITE_OTHER): Payer: Self-pay

## 2019-06-01 DIAGNOSIS — R05 Cough: Secondary | ICD-10-CM

## 2019-06-01 DIAGNOSIS — R197 Diarrhea, unspecified: Secondary | ICD-10-CM

## 2019-06-01 NOTE — ED Triage Notes (Signed)
Pt states she has diarrhea x 2 weeks and she has been coughing for 2 days.

## 2019-06-01 NOTE — Discharge Instructions (Signed)
Your Pneumonia has resolved.  You may continued to have a cough for several more days.

## 2019-06-01 NOTE — ED Provider Notes (Addendum)
[place]    CSN: [id number] Arrival date & time: 06/01/19  1721      History   Chief Complaint Chief Complaint  Patient presents with   Cough   Diarrhea    HPI Barbara Lam is a 28 y.o. female.   The history is provided by the patient. No language interpreter was used.  Cough Cough characteristics:  Unable to specify Sputum characteristics:  Nondescript Severity:  Mild Onset quality:  Gradual Duration:  2 days Timing:  Constant Progression:  Worsening Chronicity:  New Smoker: no   Relieved by:  Nothing Worsened by:  Nothing Ineffective treatments:  None tried Associated symptoms: no chest pain   Diarrhea Pt complains of continued cough.  Pt recently had pneumonia.  Pt has been having diarrhea for the past 2 days  Past Medical History:  Diagnosis Date   Asthma    Chronic headaches    Hypertension     There are no active problems to display for this patient.   Past Surgical History:  Procedure Laterality Date   EYE SURGERY      OB History    Gravida  0   Para  0   Term  0   Preterm      AB      Living        SAB      TAB      Ectopic      Multiple      Live Births               Home Medications    Prior to Admission medications   Medication Sig Start Date End Date Taking? Authorizing Provider  albuterol (VENTOLIN HFA) 108 (90 Base) MCG/ACT inhaler Inhale 1-2 puffs into the lungs every 6 (six) hours as needed for wheezing or shortness of breath. 04/04/19   Wieters, Hallie C, PA-C  amLODipine (NORVASC) 10 MG tablet Take 1 tablet (10 mg total) by mouth daily. 09/18/18   Estill Dooms, NP  benzonatate (TESSALON) 100 MG capsule Take 1 capsule (100 mg total) by mouth every 8 (eight) hours. 05/24/19   Loura Halt A, NP  cyclobenzaprine (FLEXERIL) 10 MG tablet Take 1 tablet (10 mg total) by mouth 3 (three) times daily as needed. 10/20/18   Triplett, Tammy, PA-C  ibuprofen (ADVIL) 800 MG tablet Take 1 tablet (800  mg total) by mouth every 6 (six) hours as needed for moderate pain. 05/08/19   LampteyMyrene Galas, MD  medroxyPROGESTERone Acetate (DEPO-PROVERA IM) Inject into the muscle.    [provider]  triamcinolone cream (KENALOG) 0.1 % Apply 1 application topically 2 (two) times daily. 02/02/19   Rosemarie Ax, MD    Family History Family History  Problem Relation Age of Onset   Diabetes Other     Social History Social History   Tobacco Use   Smoking status: Current Some Day Smoker    Years: 3.00    Types: Cigars   Smokeless tobacco: Never Used  Substance Use Topics   Alcohol use: Yes    Comment: Socially    Drug use: No     Allergies   Lisinopril and Tomato   Review of Systems Review of Systems  Respiratory: Positive for cough.   Cardiovascular: Negative for chest pain.  Gastrointestinal: Positive for diarrhea.  All other systems reviewed and are negative.    Physical Exam Triage Vital Signs ED Triage Vitals  Enc Vitals Group  BP 06/01/19 1743 (!) 132/92     Pulse Rate 06/01/19 1743 92     Resp 06/01/19 1743 18     Temp 06/01/19 1743 98.9 F (37.2 C)     Temp Source 06/01/19 1743 Oral     SpO2 06/01/19 1743 99 %     Weight 06/01/19 1741 178 lb (80.7 kg)     Height --      Head Circumference --      Peak Flow --      Pain Score 06/01/19 1741 10     Pain Loc --      Pain Edu? --      Excl. in [place]? --    No data found.  Updated Vital Signs BP (!) 132/92 (BP Location: Right Arm)    Pulse 92    Temp 98.9 F (37.2 C) (Oral)    Resp 18    Wt 80.7 kg    LMP 04/17/2019    SpO2 99%    BMI 31.53 kg/m   Visual Acuity Right Eye Distance:   Left Eye Distance:   Bilateral Distance:    Right Eye Near:   Left Eye Near:    Bilateral Near:     Physical Exam Vitals signs reviewed.  HENT:     Head: Normocephalic.     Mouth/Throat:     Mouth: Mucous membranes are moist.  Neck:     Musculoskeletal: Normal range of motion.  Cardiovascular:      Rate and Rhythm: Normal rate.     Pulses: Normal pulses.  Pulmonary:     Effort: Pulmonary effort is normal.  Abdominal:     General: Abdomen is flat.  Musculoskeletal: Normal range of motion.  Skin:    General: Skin is warm.  Neurological:     General: No focal deficit present.     Mental Status: She is alert.  Psychiatric:        Mood and Affect: Mood normal.      UC Treatments / Results  Labs (all labs ordered are listed, but only abnormal results are displayed) Labs Reviewed - No data to display  EKG None  Radiology Dg Chest 2 View  Result Date: 06/01/2019 CLINICAL DATA:  Cough EXAM: CHEST - 2 VIEW COMPARISON:  04/04/2019 FINDINGS: The heart size and mediastinal contours are within normal limits. Both lungs are clear. The visualized skeletal structures are unremarkable. IMPRESSION: No active cardiopulmonary disease. Electronically Signed   By: Franchot Gallo M.D.   On: 06/01/2019 18:40    Procedures Procedures (including critical care time)  Medications Ordered in UC Medications - No data to display  Initial Impression / Assessment and Plan / UC Course  I have reviewed the triage vital signs and the nursing notes.  Pertinent labs & imaging results that were available during my care of the patient were reviewed by me and considered in my medical decision making (see chart for details).     MDM  Pt advised to try lomotil for diarrhea  Final Clinical Impressions(s) / UC Diagnoses   Final diagnoses:  Diarrhea, unspecified type     Discharge Instructions     Your Pneumonia has resolved.  You may continued to have a cough for several more days.     ED Prescriptions    None     Controlled Substance Prescriptions [place] Controlled Substance Registry consulted? Not Applicable   An After Visit Summary was printed and given to the patient.    Armstrong,  Hollace Kinnier, PA-C 06/01/19 1904    Fransico Meadow, [place] 06/01/19 1912

## 2019-06-23 ENCOUNTER — Telehealth: Payer: Self-pay | Admitting: Obstetrics and Gynecology

## 2019-06-23 NOTE — Telephone Encounter (Signed)

## 2019-06-24 ENCOUNTER — Ambulatory Visit: Payer: Self-pay | Admitting: Obstetrics and Gynecology

## 2019-07-11 ENCOUNTER — Other Ambulatory Visit: Payer: Self-pay | Admitting: Adult Health

## 2019-08-05 ENCOUNTER — Ambulatory Visit (HOSPITAL_COMMUNITY)
Admission: RE | Admit: 2019-08-05 | Discharge: 2019-08-05 | Disposition: A | Discharge status: Home / Self Care | Payer: Self-pay | Source: Ambulatory Visit | Attending: *Deleted | Admitting: *Deleted

## 2019-08-05 ENCOUNTER — Other Ambulatory Visit (HOSPITAL_COMMUNITY): Payer: Self-pay | Admitting: *Deleted

## 2019-08-05 ENCOUNTER — Other Ambulatory Visit: Payer: Self-pay

## 2019-08-05 DIAGNOSIS — R05 Cough: Secondary | ICD-10-CM

## 2019-08-05 DIAGNOSIS — R0602 Shortness of breath: Secondary | ICD-10-CM

## 2019-08-05 DIAGNOSIS — R6883 Chills (without fever): Secondary | ICD-10-CM

## 2019-08-05 DIAGNOSIS — Z720 Tobacco use: Secondary | ICD-10-CM | POA: Insufficient documentation

## 2019-08-05 DIAGNOSIS — R0781 Pleurodynia: Secondary | ICD-10-CM

## 2019-08-05 DIAGNOSIS — R059 Cough, unspecified: Secondary | ICD-10-CM

## 2019-08-05 DIAGNOSIS — R062 Wheezing: Secondary | ICD-10-CM | POA: Insufficient documentation

## 2019-08-19 ENCOUNTER — Other Ambulatory Visit: Payer: Self-pay | Admitting: Adult Health

## 2019-09-21 ENCOUNTER — Other Ambulatory Visit: Payer: Self-pay | Admitting: Women's Health

## 2020-02-05 ENCOUNTER — Ambulatory Visit (HOSPITAL_COMMUNITY)
Admission: EM | Admit: 2020-02-05 | Discharge: 2020-02-05 | Disposition: A | Discharge status: Home / Self Care | Payer: Self-pay

## 2020-02-05 ENCOUNTER — Other Ambulatory Visit: Payer: Self-pay

## 2020-02-05 ENCOUNTER — Encounter (HOSPITAL_COMMUNITY): Payer: Self-pay | Admitting: Emergency Medicine

## 2020-02-05 ENCOUNTER — Ambulatory Visit (INDEPENDENT_AMBULATORY_CARE_PROVIDER_SITE_OTHER): Payer: Self-pay

## 2020-02-05 DIAGNOSIS — M25521 Pain in right elbow: Secondary | ICD-10-CM

## 2020-02-05 DIAGNOSIS — M25562 Pain in left knee: Secondary | ICD-10-CM

## 2020-02-05 DIAGNOSIS — W19XXXA Unspecified fall, initial encounter: Secondary | ICD-10-CM

## 2020-02-05 MED ORDER — IBUPROFEN 800 MG PO TABS: 800.0000 mg | ORAL_TABLET | Freq: Three times a day (TID) | ORAL | 0 refills | Status: DC | PRN

## 2020-02-05 NOTE — Discharge Instructions (Addendum)
There were no bony abnormalities on your x-rays today.  You will likely be sore for a few days from your fall.  I have sent prescription strength ibuprofen to your pharmacy.  If symptoms are not improving by Monday follow-up with orthopedics.  Go to the ER if you are experiencing unrelenting pain, shortness of breath, high fever, or other concerning symptoms.

## 2020-02-05 NOTE — ED Triage Notes (Signed)
Pt states "I slipped on the ice this morning and I twisted my knees, left one is worse and also I landed on my elbow". Pt requesting xrays.

## 2020-02-05 NOTE — ED Provider Notes (Signed)
[place]    CSN: [id number] Arrival date & time: 02/05/20  1855      History   Chief Complaint Chief Complaint  Patient presents with  . Knee Pain  . Elbow Pain    HPI Barbara Lam is a 29 y.o. female.   Reporting left knee and right elbow pain since this morning.  Patient reports that she fell while getting out of her car onto a slick drain on the curb.  Reports that she soaked her leg in a warm bath, but that is the only treatment that she had attempted at home.  She was requesting x-rays to make sure that nothing was broken.  Denies headaches, fever, sore throat, shortness of breath, chest pain, cough, rash, other symptoms.  ROS as per HPI  The history is provided by the patient.  Knee Pain   Past Medical History:  Diagnosis Date  . Asthma   . Chronic headaches   . Hypertension     There are no problems to display for this patient.   Past Surgical History:  Procedure Laterality Date  . EYE SURGERY      OB History    Gravida  0   Para  0   Term  0   Preterm      AB      Living        SAB      TAB      Ectopic      Multiple      Live Births               Home Medications      Family History Family History  Problem Relation Age of Onset  . Diabetes Other     Social History Social History   Tobacco Use  . Smoking status: Current Some Day Smoker    Years: 3.00    Types: Cigars  . Smokeless tobacco: Never Used  Substance Use Topics  . Alcohol use: Yes    Comment: Socially   . Drug use: No     Allergies   Lisinopril and Tomato   Review of Systems Review of Systems   Physical Exam Triage Vital Signs ED Triage Vitals  Enc Vitals Group     BP 02/05/20 1911 123/76     Pulse Rate 02/05/20 1911 (!) 109     Resp 02/05/20 1911 18     Temp 02/05/20 1911 98 F (36.7 C)     Temp src --      SpO2 02/05/20 1911 99 %     Weight --      Height --      Head Circumference --      Peak Flow --      Pain  Score 02/05/20 1912 8     Pain Loc --      Pain Edu? --      Excl. in [place]? --    No data found.  Updated Vital Signs BP 123/76   Pulse (!) 109   Temp 98 F (36.7 C)   Resp 18   SpO2 99%     Physical Exam Vitals and nursing note reviewed.  Constitutional:      General: She is not in acute distress.    Appearance: Normal appearance. She is well-developed.  HENT:     Head: Normocephalic and atraumatic.     Mouth/Throat:     Mouth: Mucous membranes are moist.  Eyes:  Conjunctiva/sclera: Conjunctivae normal.  Cardiovascular:     Rate and Rhythm: Normal rate and regular rhythm.     Heart sounds: Normal heart sounds. No murmur.  Pulmonary:     Effort: Pulmonary effort is normal. No respiratory distress.     Breath sounds: Normal breath sounds. No stridor. No wheezing, rhonchi or rales.  Abdominal:     Palpations: Abdomen is soft.     Tenderness: There is no abdominal tenderness.  Musculoskeletal:     Right elbow: Tenderness present in olecranon process.     Cervical back: Neck supple.     Left knee: Swelling and ecchymosis present. Tenderness present over the medial joint line.  Skin:    General: Skin is warm and dry.     Capillary Refill: Capillary refill takes less than 2 seconds.  Neurological:     General: No focal deficit present.     Mental Status: She is alert and oriented to person, place, and time.  Psychiatric:        Mood and Affect: Mood normal.        Behavior: Behavior normal.      UC Treatments / Results  Labs (all labs ordered are listed, but only abnormal results are displayed) Labs Reviewed - No data to display  EKG   Radiology DG ELBOW COMPLETE RIGHT (3+VIEW)  Result Date: 02/05/2020 CLINICAL DATA:  Right elbow pain since the patient slipped and fell on ice today. Initial encounter. EXAM: RIGHT ELBOW - COMPLETE 3+ VIEW COMPARISON:  None. FINDINGS: There is no evidence of fracture, dislocation, or joint effusion. There is no evidence of  arthropathy or other focal bone abnormality. Soft tissues are unremarkable. IMPRESSION: Normal exam. Electronically Signed   By: Inge Rise M.D.   On: 02/05/2020 19:34   DG Knee Complete 4 Views Left  Result Date: 02/05/2020 CLINICAL DATA:  Right knee pain since the patient slipped and fell on ice today. Initial encounter. EXAM: LEFT KNEE - COMPLETE 4+ VIEW COMPARISON:  None. FINDINGS: No evidence of fracture, dislocation, or joint effusion. No evidence of arthropathy or other focal bone abnormality. Soft tissues are unremarkable. IMPRESSION: Normal exam. Electronically Signed   By: Inge Rise M.D.   On: 02/05/2020 19:35    Procedures Procedures (including critical care time)  Medications Ordered in UC Medications - No data to display  Initial Impression / Assessment and Plan / UC Course  I have reviewed the triage vital signs and the nursing notes.  Pertinent labs & imaging results that were available during my care of the patient were reviewed by me and considered in my medical decision making (see chart for details).     Left knee pain and right arm pain starting this morning.  X-rays negative in office today.  Prescription strength ibuprofen sent to her pharmacy.  Instructed to follow-up with orthopedics if symptoms are not improving.  Instructed on when to go to the ER. Final Clinical Impressions(s) / UC Diagnoses   Final diagnoses:  Acute pain of left knee  Elbow pain, right  Fall, initial encounter     Discharge Instructions     There were no bony abnormalities on your x-rays today.  You will likely be sore for a few days from your fall.  I have sent prescription strength ibuprofen to your pharmacy.  If symptoms are not improving by Monday follow-up with orthopedics.  Go to the ER if you are experiencing unrelenting pain, shortness of breath, high fever, or other concerning symptoms.  ED Prescriptions    Medication Sig Dispense Auth. Provider   ibuprofen  (ADVIL) 800 MG tablet Take 1 tablet (800 mg total) by mouth every 8 (eight) hours as needed for moderate pain. 21 tablet Faustino Congress, NP     I have reviewed the PDMP during this encounter.   Faustino Congress, NP 02/05/20 1951

## 2020-03-25 ENCOUNTER — Other Ambulatory Visit: Payer: Self-pay

## 2020-03-25 ENCOUNTER — Emergency Department (HOSPITAL_COMMUNITY)
Admission: EM | Admit: 2020-03-25 | Discharge: 2020-03-26 | Disposition: A | Discharge status: Home / Self Care | Payer: Self-pay | Attending: Emergency Medicine | Admitting: Emergency Medicine

## 2020-03-25 DIAGNOSIS — Z79899 Other long term (current) drug therapy: Secondary | ICD-10-CM | POA: Insufficient documentation

## 2020-03-25 DIAGNOSIS — F17210 Nicotine dependence, cigarettes, uncomplicated: Secondary | ICD-10-CM | POA: Insufficient documentation

## 2020-03-25 DIAGNOSIS — I10 Essential (primary) hypertension: Secondary | ICD-10-CM | POA: Insufficient documentation

## 2020-03-25 DIAGNOSIS — J209 Acute bronchitis, unspecified: Secondary | ICD-10-CM | POA: Insufficient documentation

## 2020-03-25 DIAGNOSIS — Z20822 Contact with and (suspected) exposure to covid-19: Secondary | ICD-10-CM | POA: Insufficient documentation

## 2020-03-25 DIAGNOSIS — J4521 Mild intermittent asthma with (acute) exacerbation: Secondary | ICD-10-CM | POA: Insufficient documentation

## 2020-03-25 NOTE — ED Triage Notes (Signed)
Pt states she has had a cough for a couple of days, congested.

## 2020-03-26 ENCOUNTER — Emergency Department (HOSPITAL_COMMUNITY): Payer: Self-pay

## 2020-03-26 LAB — SARS CORONAVIRUS 2 (TAT 6-24 HRS): SARS Coronavirus 2: NEGATIVE

## 2020-03-26 MED ORDER — PREDNISONE 20 MG PO TABS: ORAL_TABLET | ORAL | 0 refills | Status: DC

## 2020-03-26 MED ORDER — AMOXICILLIN 500 MG PO CAPS: 500.0000 mg | ORAL_CAPSULE | Freq: Three times a day (TID) | ORAL | 0 refills | Status: DC

## 2020-03-26 MED ORDER — ALBUTEROL SULFATE HFA 108 (90 BASE) MCG/ACT IN AERS
8.0000 | INHALATION_SPRAY | Freq: Once | RESPIRATORY_TRACT | Status: AC
  Administered 2020-03-26: 8 via RESPIRATORY_TRACT
  Filled 2020-03-26: qty 6.7

## 2020-03-26 MED ORDER — PREDNISONE 50 MG PO TABS
60.0000 mg | ORAL_TABLET | Freq: Once | ORAL | Status: AC
  Administered 2020-03-26: 60 mg via ORAL
  Filled 2020-03-26: qty 1

## 2020-03-26 MED ORDER — AEROCHAMBER Z-STAT PLUS/MEDIUM MISC
1.0000 | Freq: Once | Status: AC
  Administered 2020-03-26: 1
  Filled 2020-03-26: qty 1

## 2020-03-26 NOTE — Discharge Instructions (Signed)
Use the inhaler 2 puffs every 4-6 hours as needed for shortness of breath or wheezing.  Take the antibiotic and prednisone as prescribed until gone.  Recheck if you get a high fever, struggle to breathe, or seem worse.

## 2020-03-26 NOTE — ED Provider Notes (Signed)
[facility] EMERGENCY DEPARTMENT Provider Note   CSN: [id number] Arrival date & time: 03/25/20  2331   Time seen 11:45 PM  History Chief Complaint  Patient presents with  . Cough    Barbara Lam is a 29 y.o. female.  HPI   Patient reports she has a history of asthma.  She states a few days ago around April 6 or 7 she started having a cough that is productive of mucus off and on that is sometimes white and sometimes yellow.  She is unsure if fever because she has hypertension and she knows that that can make you have fever if your blood pressure gets high.  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  She states she has a sore throat and sometimes has trouble swallowing.  She has rhinorrhea sometimes that is white and some sneezing.  She has wheezing sometimes but her inhaler also helps sometimes.  She has chest pain in her left upper chest here and there that can last 5 to 10 minutes.  Last time was the other day which was probably 2 days ago.  She states sometimes she feels short of breath when she coughs or she is wheezing.  She states she quit smoking yesterday and had cut down to smoking 1-2 black in miles a day.  Nobody else is sick that she is around.  PCP Health, [facility]   Past Medical History:  Diagnosis Date  . Asthma   . Chronic headaches   . Hypertension     There are no problems to display for this patient.   Past Surgical History:  Procedure Laterality Date  . EYE SURGERY       OB History    Gravida  0   Para  0   Term  0   Preterm      AB      Living        SAB      TAB      Ectopic      Multiple      Live Births              Family History  Problem Relation Age of Onset  . Diabetes Other     Social History   Tobacco Use  . Smoking status: Current Some Day Smoker    Years: 3.00    Types: Cigars  . Smokeless tobacco: Never Used  Substance Use Topics  .  Alcohol use: Yes    Comment: Socially   . Drug use: No  employed  Home Medications Prior to Admission medications   Medication Sig Start Date End Date Taking? Authorizing Provider  albuterol (VENTOLIN HFA) 108 (90 Base) MCG/ACT inhaler Inhale 1-2 puffs into the lungs every 6 (six) hours as needed for wheezing or shortness of breath. 04/04/19   Wieters, Hallie C, PA-C  ALLERGY RELIEF 10 MG tablet Take 10 mg by mouth daily. 01/11/20   [provider]  amLODipine (NORVASC) 10 MG tablet TAKE ONE TABLET BY MOUTH DAILY THIS IS THE LAST REFILL. PATIENT NEEDS AN APPOINTMENT FOR PHYSICAL PRIOR TO ANY FURTHER REFILLS 09/21/19   Roma Schanz, CNM  amoxicillin (AMOXIL) 500 MG capsule Take 1 capsule (500 mg total) by mouth 3 (three) times daily. 03/26/20   Rolland Porter, MD  benzonatate (TESSALON) 100 MG capsule Take 1 capsule (100 mg total) by mouth every 8 (eight) hours. 05/24/19  Loura Halt A, NP  cyclobenzaprine (FLEXERIL) 10 MG tablet Take 1 tablet (10 mg total) by mouth 3 (three) times daily as needed. 10/20/18   Triplett, Tammy, PA-C  ibuprofen (ADVIL) 800 MG tablet Take 1 tablet (800 mg total) by mouth every 8 (eight) hours as needed for moderate pain. 02/05/20   Faustino Congress, NP  medroxyPROGESTERone Acetate (DEPO-PROVERA IM) Inject into the muscle.    [provider]  montelukast (SINGULAIR) 10 MG tablet Take 10 mg by mouth daily. 01/11/20   [provider]  predniSONE (DELTASONE) 20 MG tablet Take 3 po QD x 3d , then 2 po QD x 3d then 1 po QD x 3d 03/26/20   Rolland Porter, MD  triamcinolone cream (KENALOG) 0.1 % Apply 1 application topically 2 (two) times daily. 02/02/19   Rosemarie Ax, MD  Sleeping pill for Insomnia, Depo-Provera, Singulair, amlodipine, and albuterol inhaler.  Allergies    Lisinopril and Tomato  Review of Systems   Review of Systems  All other systems reviewed and are negative.   Physical Exam Updated Vital Signs BP 124/86   Pulse (!) 110    Temp 98.8 F (37.1 C) (Oral)   Resp 17   Ht 5\' 2"  (1.575 m)   Wt 90.7 kg   SpO2 100%   BMI 36.58 kg/m   Physical Exam Vitals and nursing note reviewed.  Constitutional:      General: She is not in acute distress.    Appearance: Normal appearance. She is normal weight.  HENT:     Head: Normocephalic and atraumatic.     Right Ear: External ear normal.     Left Ear: External ear normal.     Nose: Nose normal.     Mouth/Throat:     Mouth: Mucous membranes are moist.     Pharynx: No oropharyngeal exudate or posterior oropharyngeal erythema.  Eyes:     Extraocular Movements: Extraocular movements intact.     Conjunctiva/sclera: Conjunctivae normal.     Pupils: Pupils are equal, round, and reactive to light.  Cardiovascular:     Rate and Rhythm: Normal rate and regular rhythm.     Pulses: Normal pulses.     Heart sounds: Normal heart sounds. No murmur.  Pulmonary:     Effort: Pulmonary effort is normal. No respiratory distress.     Breath sounds: Normal breath sounds.  Musculoskeletal:        General: Normal range of motion.  Skin:    General: Skin is warm and dry.  Neurological:     General: No focal deficit present.     Mental Status: She is alert and oriented to person, place, and time.     Cranial Nerves: No cranial nerve deficit.  Psychiatric:        Mood and Affect: Mood normal.        Behavior: Behavior normal.        Thought Content: Thought content normal.     ED Results / Procedures / Treatments   Labs (all labs ordered are listed, but only abnormal results are displayed) Labs Reviewed  SARS CORONAVIRUS 2 (TAT 6-24 HRS)    EKG None  Radiology DG Chest Port 1 View  Result Date: 03/26/2020 CLINICAL DATA:  History of asthma EXAM: PORTABLE CHEST 1 VIEW COMPARISON:  08/05/2019 FINDINGS: The heart size and mediastinal contours are within normal limits. Both lungs are clear. The visualized skeletal structures are unremarkable. IMPRESSION: No active disease.  Electronically Signed   By:  Donavan Foil M.D.   On: 03/26/2020 00:43    Procedures Procedures (including critical care time)  Medications Ordered in ED Medications  albuterol (VENTOLIN HFA) 108 (90 Base) MCG/ACT inhaler 8 puff (8 puffs Inhalation Given 03/26/20 0021)  predniSONE (DELTASONE) tablet 60 mg (60 mg Oral Given 03/26/20 0020)  aerochamber Z-Stat Plus/medium 1 each (1 each Other Given 03/26/20 0021)    ED Course  I have reviewed the triage vital signs and the nursing notes.  Pertinent labs & imaging results that were available during my care of the patient were reviewed by me and considered in my medical decision making (see chart for details).    MDM Rules/Calculators/A&P                      Patient is agreeable to be tested for Covid, Covid testing was sent out.  She was given albuterol 8 puffs by inhaler, she states she feels like her chest is tight..  She was started on prednisone for probable asthmatic bronchitis.  Portable chest x-ray was done to look for infiltrates.  Recheck at 1:05 AM patient is feeling better.  She does have improved air movement when I listen to her lungs now.  We discussed her test results.  She was discharged home with prednisone and antibiotics.  Final Clinical Impression(s) / ED Diagnoses Final diagnoses:  Acute bronchitis, unspecified organism  Mild intermittent asthma with exacerbation    Rx / DC Orders ED Discharge Orders         Ordered    predniSONE (DELTASONE) 20 MG tablet     03/26/20 0111    amoxicillin (AMOXIL) 500 MG capsule  3 times daily     03/26/20 0111         Plan discharge  Rolland Porter, MD, Barbette Or, MD 03/26/20 [phone]

## 2020-04-07 ENCOUNTER — Emergency Department (HOSPITAL_COMMUNITY)
Admission: EM | Admit: 2020-04-07 | Discharge: 2020-04-07 | Disposition: A | Discharge status: Home / Self Care | Payer: Self-pay | Attending: Emergency Medicine | Admitting: Emergency Medicine

## 2020-04-07 ENCOUNTER — Other Ambulatory Visit: Payer: Self-pay

## 2020-04-07 ENCOUNTER — Encounter (HOSPITAL_COMMUNITY): Payer: Self-pay | Admitting: *Deleted

## 2020-04-07 DIAGNOSIS — R102 Pelvic and perineal pain: Secondary | ICD-10-CM | POA: Insufficient documentation

## 2020-04-07 DIAGNOSIS — F17290 Nicotine dependence, other tobacco product, uncomplicated: Secondary | ICD-10-CM | POA: Insufficient documentation

## 2020-04-07 DIAGNOSIS — N760 Acute vaginitis: Secondary | ICD-10-CM | POA: Insufficient documentation

## 2020-04-07 DIAGNOSIS — Z5321 Procedure and treatment not carried out due to patient leaving prior to being seen by health care provider: Secondary | ICD-10-CM | POA: Insufficient documentation

## 2020-04-07 DIAGNOSIS — R109 Unspecified abdominal pain: Secondary | ICD-10-CM | POA: Insufficient documentation

## 2020-04-07 MED ORDER — SODIUM CHLORIDE 0.9% FLUSH: 3.0000 mL | Freq: Once | INTRAVENOUS | Status: DC

## 2020-04-07 NOTE — ED Notes (Addendum)
Pt left and said she will be going to another hostiptal

## 2020-04-07 NOTE — ED Notes (Signed)
Pt does not want blood drawn, reports had blood work done on Monday.

## 2020-04-07 NOTE — ED Triage Notes (Signed)
C/o lower abdominal pain for several days, nausea, no urinary problems or vaginal problems. Pain increased with bowel movements

## 2020-04-08 ENCOUNTER — Encounter (HOSPITAL_COMMUNITY): Payer: Self-pay

## 2020-04-08 ENCOUNTER — Emergency Department (HOSPITAL_COMMUNITY)
Admission: EM | Admit: 2020-04-08 | Discharge: 2020-04-08 | Disposition: A | Discharge status: Home / Self Care | Payer: Self-pay | Attending: Emergency Medicine | Admitting: Emergency Medicine

## 2020-04-08 ENCOUNTER — Other Ambulatory Visit: Payer: Self-pay

## 2020-04-08 DIAGNOSIS — N760 Acute vaginitis: Secondary | ICD-10-CM

## 2020-04-08 DIAGNOSIS — R102 Pelvic and perineal pain: Secondary | ICD-10-CM

## 2020-04-08 DIAGNOSIS — B9689 Other specified bacterial agents as the cause of diseases classified elsewhere: Secondary | ICD-10-CM

## 2020-04-08 LAB — URINALYSIS, ROUTINE W REFLEX MICROSCOPIC
Bilirubin Urine: NEGATIVE
Glucose, UA: NEGATIVE mg/dL
Hgb urine dipstick: NEGATIVE
Ketones, ur: NEGATIVE mg/dL
Nitrite: NEGATIVE
Protein, ur: NEGATIVE mg/dL
Specific Gravity, Urine: 1.019 (ref 1.005–1.030)
pH: 6 (ref 5.0–8.0)

## 2020-04-08 LAB — WET PREP, GENITAL
Sperm: NONE SEEN
Trich, Wet Prep: NONE SEEN
Yeast Wet Prep HPF POC: NONE SEEN

## 2020-04-08 LAB — POC URINE PREG, ED: Preg Test, Ur: NEGATIVE

## 2020-04-08 MED ORDER — METRONIDAZOLE 500 MG PO TABS: 500.0000 mg | ORAL_TABLET | Freq: Two times a day (BID) | ORAL | 0 refills | Status: DC

## 2020-04-08 NOTE — ED Provider Notes (Signed)
[facility] EMERGENCY DEPARTMENT Provider Note   CSN: [id number] Arrival date & time: 04/07/20  2341   Time seen 2:43 AM  History Chief Complaint  Patient presents with  . Abdominal Pain    Barbara Lam is a 29 y.o. female.  HPI   Patient states she has been having abdominal pain for actually a couple weeks, she initially told me it was only a few days.  She states the pain is in her lower abdomen and is "in my uterus and cervix".  She states the pain is there constantly and it sharp in a pressure sensation.  She initially denied anything make it hurt worse however on further talking walking, bending over and when she has diarrhea that makes the pain worse.  She states nothing makes it feel better.  She states she has had nausea without vomiting.  She states she has had diarrhea 1-2 episodes every 4 hours for a week but it is actually is 4 times a day.  She states she has a dry tongue "depending".  She denies fever.  She states sometimes the diarrhea is loose and sometimes it is watery.  She denies vaginal discharge, dysuria, frequency, hematuria, or flank pain.  She states she had something similar in 2019 when she was diagnosed with fibroids.  She states she is to have very heavy periods and she has been on Depo-Provera and has had no periods for at least a year and a half.  Later on she told me she has been spotting and then had a normal period since November.  She has taken no meds for this.  [facility] and states she had blood work done then that was "normal".  She however did not get to see her OB/GYN because she will not be back in the office for another couple weeks.   PCP Health, [facility]   Past Medical History:  Diagnosis Date  . Asthma   . Chronic headaches   . Hypertension     There are no problems to display for this patient.   Past Surgical History:  Procedure Laterality Date  . EYE SURGERY       OB  History    Gravida  0   Para  0   Term  0   Preterm      AB      Living        SAB      TAB      Ectopic      Multiple      Live Births              Family History  Problem Relation Age of Onset  . Diabetes Other     Social History   Tobacco Use  . Smoking status: Current Some Day Smoker    Years: 3.00    Types: Cigars  . Smokeless tobacco: Never Used  Substance Use Topics  . Alcohol use: Yes    Comment: Socially   . Drug use: No  employed  Home Medications Prior to Admission medications   Medication Sig Start Date End Date Taking? Authorizing Provider  albuterol (VENTOLIN HFA) 108 (90 Base) MCG/ACT inhaler Inhale 1-2 puffs into the lungs every 6 (six) hours as needed for wheezing or shortness of breath. 04/04/19   Wieters, Hallie C, PA-C  ALLERGY RELIEF 10 MG tablet Take 10 mg by mouth daily. 01/11/20   [provider]  amLODipine (NORVASC) 10 MG tablet TAKE ONE TABLET BY MOUTH DAILY  THIS IS THE LAST REFILL. PATIENT NEEDS AN APPOINTMENT FOR PHYSICAL PRIOR TO ANY FURTHER REFILLS 09/21/19   Roma Schanz, CNM  amoxicillin (AMOXIL) 500 MG capsule Take 1 capsule (500 mg total) by mouth 3 (three) times daily. 03/26/20   Rolland Porter, MD  benzonatate (TESSALON) 100 MG capsule Take 1 capsule (100 mg total) by mouth every 8 (eight) hours. 05/24/19   Loura Halt A, NP  cyclobenzaprine (FLEXERIL) 10 MG tablet Take 1 tablet (10 mg total) by mouth 3 (three) times daily as needed. 10/20/18   Triplett, Tammy, PA-C  ibuprofen (ADVIL) 800 MG tablet Take 1 tablet (800 mg total) by mouth every 8 (eight) hours as needed for moderate pain. 02/05/20   Faustino Congress, NP  medroxyPROGESTERone Acetate (DEPO-PROVERA IM) Inject into the muscle.    [provider]  metroNIDAZOLE (FLAGYL) 500 MG tablet Take 1 tablet (500 mg total) by mouth 2 (two) times daily. 04/08/20   Rolland Porter, MD  montelukast (SINGULAIR) 10 MG tablet Take 10 mg by mouth daily. 01/11/20    [provider]  predniSONE (DELTASONE) 20 MG tablet Take 3 po QD x 3d , then 2 po QD x 3d then 1 po QD x 3d 03/26/20   Rolland Porter, MD  triamcinolone cream (KENALOG) 0.1 % Apply 1 application topically 2 (two) times daily. 02/02/19   Rosemarie Ax, MD  unknown sleeping pill  Allergies    Lisinopril and Tomato  Review of Systems   Review of Systems  All other systems reviewed and are negative.   Physical Exam Updated Vital Signs BP 124/76 (BP Location: Right Arm)   Pulse 99   Temp 98.4 F (36.9 C) (Oral)   Resp 16   Ht 5\' 2"  (1.575 m)   Wt 90.3 kg   SpO2 100%   BMI 36.40 kg/m   Physical Exam Vitals and nursing note reviewed.  Constitutional:      General: She is not in acute distress.    Appearance: Normal appearance. She is well-developed. She is not ill-appearing or toxic-appearing.  HENT:     Head: Normocephalic and atraumatic.     Right Ear: External ear normal.     Left Ear: External ear normal.     Nose: Nose normal. No mucosal edema or rhinorrhea.     Mouth/Throat:     Mouth: Mucous membranes are moist.     Dentition: No dental abscesses.     Pharynx: No uvula swelling.  Eyes:     Extraocular Movements: Extraocular movements intact.     Conjunctiva/sclera: Conjunctivae normal.     Pupils: Pupils are equal, round, and reactive to light.  Cardiovascular:     Rate and Rhythm: Normal rate and regular rhythm.     Heart sounds: Normal heart sounds. No murmur. No friction rub. No gallop.   Pulmonary:     Effort: Pulmonary effort is normal. No respiratory distress.     Breath sounds: Normal breath sounds. No wheezing, rhonchi or rales.  Chest:     Chest wall: No tenderness or crepitus.  Abdominal:     General: Bowel sounds are increased. There is no distension.     Palpations: Abdomen is soft.     Tenderness: There is no abdominal tenderness. There is no guarding or rebound.    Genitourinary:    Comments: Patient has normal external genitalia.  She  does have some thin  white discharge.  Her uterus appears to be enlarged and is tender, her adnexa are tender without masses. Musculoskeletal:        General: No tenderness. Normal range of motion.     Cervical back: Full passive range of motion without pain, normal range of motion and neck supple.     Comments: Moves all extremities well.   Skin:    General: Skin is warm and dry.     Coloration: Skin is not pale.     Findings: No erythema or rash.  Neurological:     General: No focal deficit present.     Mental Status: She is alert and oriented to person, place, and time.     Cranial Nerves: No cranial nerve deficit.  Psychiatric:        Mood and Affect: Mood normal. Mood is not anxious.        Speech: Speech normal.        Behavior: Behavior normal.        Thought Content: Thought content normal.     ED Results / Procedures / Treatments   Labs (all labs ordered are listed, but only abnormal results are displayed) Results for orders placed or performed during the hospital encounter of 04/08/20  Wet prep, genital  Result Value Ref Range   Yeast Wet Prep HPF POC NONE SEEN NONE SEEN   Trich, Wet Prep NONE SEEN NONE SEEN   Clue Cells Wet Prep HPF POC PRESENT (A) NONE SEEN   WBC, Wet Prep HPF POC MANY (A) NONE SEEN   Sperm NONE SEEN   Urinalysis, Routine w reflex microscopic  Result Value Ref Range   Color, Urine YELLOW YELLOW   APPearance CLEAR CLEAR   Specific Gravity, Urine 1.019 1.005 - 1.030   pH 6.0 5.0 - 8.0   Glucose, UA NEGATIVE NEGATIVE mg/dL   Hgb urine dipstick NEGATIVE NEGATIVE   Bilirubin Urine NEGATIVE NEGATIVE   Ketones, ur NEGATIVE NEGATIVE mg/dL   Protein, ur NEGATIVE NEGATIVE mg/dL   Nitrite NEGATIVE NEGATIVE   Leukocytes,Ua TRACE (A) NEGATIVE   RBC / HPF 6-10 0 - 5 RBC/hpf   WBC, UA 0-5 0 - 5 WBC/hpf   Bacteria, UA RARE (A) NONE SEEN   Squamous Epithelial / LPF 0-5 0 - 5   Mucus PRESENT   POC urine preg, ED  Result Value Ref Range   Preg Test, Ur  NEGATIVE NEGATIVE   Laboratory interpretation all normal except bacterial vaginosis    EKG None  Radiology No results found.  Procedures Procedures (including critical care time)  Medications Ordered in ED Medications - No data to display  ED Course  I have reviewed the triage vital signs and the nursing notes.  Pertinent labs & imaging results that were available during my care of the patient were reviewed by me and considered in my medical decision making (see chart for details).    MDM Rules/Calculators/A&P                      Patient did not want to be treated empirically for STD.  She does want to have an outpatient pelvic ultrasound done.  She was treated for bacterial vaginosis.    Final Clinical Impression(s) / ED Diagnoses Final diagnoses:  Pelvic pain  BV (bacterial vaginosis)    Rx / DC Orders ED Discharge Orders         Ordered    metroNIDAZOLE (FLAGYL) 500 MG tablet  2 times  daily     04/08/20 0437    US PELVIC COMPLETE WITH TRANSVAGINAL     04/08/20 0437        OTC ibuprofen  Plan discharge  Rolland Porter, MD, Barbette Or, MD 04/08/20 [phone]

## 2020-04-08 NOTE — ED Notes (Signed)
Per phlebotomist, pt refused blood work stating it was done on Monday.

## 2020-04-08 NOTE — ED Triage Notes (Signed)
Lower abd pain x 2 days, reports as sharp and cramping.

## 2020-04-08 NOTE — Discharge Instructions (Addendum)
You have a minor vaginal infection called bacterial vaginosis, it is not an STD.  Please take the antibiotics until gone.  You can take ibuprofen 600 mg 4 times a day for your pain.  Please call [phone] after 8:30 in the morning to get the pelvic ultrasound scheduled.

## 2020-04-11 LAB — GC/CHLAMYDIA PROBE AMP (~~LOC~~) NOT AT ARMC
Chlamydia: NEGATIVE
Comment: NEGATIVE
Comment: NORMAL
Neisseria Gonorrhea: NEGATIVE

## 2020-04-13 ENCOUNTER — Ambulatory Visit (HOSPITAL_COMMUNITY): Payer: Self-pay

## 2020-04-21 ENCOUNTER — Encounter (HOSPITAL_COMMUNITY): Payer: Self-pay

## 2020-04-21 ENCOUNTER — Ambulatory Visit (HOSPITAL_COMMUNITY)
Admission: EM | Admit: 2020-04-21 | Discharge: 2020-04-21 | Disposition: A | Discharge status: Home / Self Care | Payer: Managed Care, Other (non HMO) | Attending: Emergency Medicine | Admitting: Emergency Medicine

## 2020-04-21 ENCOUNTER — Other Ambulatory Visit: Payer: Self-pay

## 2020-04-21 DIAGNOSIS — Z793 Long term (current) use of hormonal contraceptives: Secondary | ICD-10-CM | POA: Insufficient documentation

## 2020-04-21 DIAGNOSIS — Z7952 Long term (current) use of systemic steroids: Secondary | ICD-10-CM | POA: Diagnosis not present

## 2020-04-21 DIAGNOSIS — Z20822 Contact with and (suspected) exposure to covid-19: Secondary | ICD-10-CM | POA: Diagnosis not present

## 2020-04-21 DIAGNOSIS — F17290 Nicotine dependence, other tobacco product, uncomplicated: Secondary | ICD-10-CM | POA: Diagnosis not present

## 2020-04-21 DIAGNOSIS — R05 Cough: Secondary | ICD-10-CM | POA: Insufficient documentation

## 2020-04-21 DIAGNOSIS — Z888 Allergy status to other drugs, medicaments and biological substances status: Secondary | ICD-10-CM | POA: Insufficient documentation

## 2020-04-21 DIAGNOSIS — J302 Other seasonal allergic rhinitis: Secondary | ICD-10-CM | POA: Diagnosis present

## 2020-04-21 DIAGNOSIS — Z79899 Other long term (current) drug therapy: Secondary | ICD-10-CM | POA: Insufficient documentation

## 2020-04-21 DIAGNOSIS — R059 Cough, unspecified: Secondary | ICD-10-CM

## 2020-04-21 NOTE — Discharge Instructions (Addendum)
covid results will show on my chart check these in 24 hours  Take otc allergy medications to help with allergies

## 2020-04-21 NOTE — ED Triage Notes (Addendum)
Pt is here with a sore throat, vomiting for 3 days now, pt has not taken any meds to relieve discomfort.

## 2020-04-21 NOTE — ED Provider Notes (Signed)
[place]    CSN: [id number] Arrival date & time: 04/21/20  1923      History   Chief Complaint Chief Complaint  Patient presents with   Vomiting    HPI Barbara Lam is a 29 y.o. female.   Pt works at [employer] center she has emesis x1 yesterday. She is not here for emesis she states that she ws coughing at work and they sent her for a covid testing. She already had testing completed 10 days ago and it was negative but they need her to have one done today to return back to work. Pt states that her cough is from her allergies. Pt was seen at her doc office yesterday and does not need to be seen for emesis.      Past Medical History:  Diagnosis Date   Asthma    Chronic headaches    Hypertension     There are no problems to display for this patient.   Past Surgical History:  Procedure Laterality Date   EYE SURGERY      OB History    Gravida  0   Para  0   Term  0   Preterm      AB      Living        SAB      TAB      Ectopic      Multiple      Live Births               Home Medications    Prior to Admission medications   Medication Sig Start Date End Date Taking? Authorizing Provider  albuterol (VENTOLIN HFA) 108 (90 Base) MCG/ACT inhaler Inhale 1-2 puffs into the lungs every 6 (six) hours as needed for wheezing or shortness of breath. 04/04/19   Wieters, Hallie C, PA-C  ALLERGY RELIEF 10 MG tablet Take 10 mg by mouth daily. 01/11/20   [provider]  amoxicillin (AMOXIL) 500 MG capsule Take 1 capsule (500 mg total) by mouth 3 (three) times daily. 03/26/20   Rolland Porter, MD  benzonatate (TESSALON) 100 MG capsule Take 1 capsule (100 mg total) by mouth every 8 (eight) hours. 05/24/19   Orvan July, NP  COVID-19 Specimen Collection KIT See admin instructions. for testing 02/23/20   [provider]  cyclobenzaprine (FLEXERIL) 10 MG tablet Take 1 tablet (10 mg total) by mouth 3 (three) times daily  as needed. 10/20/18   Triplett, Tammy, PA-C  fluticasone (FLONASE) 50 MCG/ACT nasal spray  03/01/20   [provider]  ibuprofen (ADVIL) 800 MG tablet Take 1 tablet (800 mg total) by mouth every 8 (eight) hours as needed for moderate pain. 02/05/20   Faustino Congress, NP  medroxyPROGESTERone Acetate (DEPO-PROVERA IM) Inject into the muscle.    [provider]  metroNIDAZOLE (FLAGYL) 500 MG tablet Take 1 tablet (500 mg total) by mouth 2 (two) times daily. 04/08/20   Rolland Porter, MD  montelukast (SINGULAIR) 10 MG tablet Take 10 mg by mouth daily. 01/11/20   [provider]  predniSONE (DELTASONE) 20 MG tablet Take 3 po QD x 3d , then 2 po QD x 3d then 1 po QD x 3d 03/26/20   Rolland Porter, MD  triamcinolone cream (KENALOG) 0.1 % Apply 1 application topically 2 (two) times daily. 02/02/19   Rosemarie Ax, MD  amLODipine (NORVASC) 5 MG tablet Take 5 mg by mouth daily. 03/01/20 04/21/20  [provider]    Family History Family History  Problem Relation Age of Onset   Diabetes Other    Asthma Mother    Healthy Father     Social History Social History   Tobacco Use   Smoking status: Current Some Day Smoker    Years: 3.00    Types: Cigars   Smokeless tobacco: Never Used  Substance Use Topics   Alcohol use: Yes    Comment: Socially    Drug use: No     Allergies   Lisinopril and Tomato   Review of Systems Review of Systems  Constitutional: Negative.   HENT: Negative.   Respiratory: Positive for cough.   Cardiovascular: Negative.   Gastrointestinal: Negative.   Genitourinary: Negative.   Musculoskeletal: Negative.   Neurological: Negative.      Physical Exam Triage Vital Signs ED Triage Vitals  Enc Vitals Group     BP 04/21/20 1943 121/88     Pulse Rate 04/21/20 1943 100     Resp 04/21/20 1943 18     Temp 04/21/20 1943 98.4 F (36.9 C)     Temp Source 04/21/20 1943 Oral     SpO2 04/21/20 1943 98 %     Weight 04/21/20 1940 200 lb  (90.7 kg)     Height --      Head Circumference --      Peak Flow --      Pain Score 04/21/20 1940 0     Pain Loc --      Pain Edu? --      Excl. in [place]? --    No data found.  Updated Vital Signs BP 121/88 (BP Location: Right Arm)    Pulse 100    Temp 98.4 F (36.9 C) (Oral)    Resp 18    Wt 200 lb (90.7 kg)    SpO2 98%    BMI 36.58 kg/m   Visual Acuity     Physical Exam HENT:     Nose: Rhinorrhea present.     Mouth/Throat:     Mouth: Mucous membranes are moist.  Eyes:     Pupils: Pupils are equal, round, and reactive to light.  Cardiovascular:     Rate and Rhythm: Normal rate.  Pulmonary:     Effort: Pulmonary effort is normal.  Abdominal:     General: Abdomen is flat.  Neurological:     General: No focal deficit present.     Mental Status: She is alert.      UC Treatments / Results  Labs (all labs ordered are listed, but only abnormal results are displayed) Labs Reviewed  SARS CORONAVIRUS 2 (TAT 6-24 HRS)    EKG   Radiology No results found.  Procedures Procedures (including critical care time)  Medications Ordered in UC Medications - No data to display  Initial Impression / Assessment and Plan / UC Course  I have reviewed the triage vital signs and the nursing notes.  Pertinent labs & imaging results that were available during my care of the patient were reviewed by me and considered in my medical decision making (see chart for details).    covid results will show on my chart check these in 24 hours  Take otc allergy medications to help with allergies   Final Clinical Impressions(s) / UC Diagnoses   Final diagnoses:  Seasonal allergies  Cough     Discharge Instructions     covid results will show on my chart check these in  24 hours  Take otc allergy medications to help with allergies    ED Prescriptions    None     PDMP not reviewed this encounter.   Marney Setting, NP 04/21/20 2023

## 2020-04-23 LAB — SARS CORONAVIRUS 2 (TAT 6-24 HRS): SARS Coronavirus 2: NEGATIVE

## 2020-05-05 ENCOUNTER — Encounter (HOSPITAL_COMMUNITY): Payer: Self-pay

## 2020-05-05 ENCOUNTER — Emergency Department (HOSPITAL_COMMUNITY)
Admission: EM | Admit: 2020-05-05 | Discharge: 2020-05-06 | Disposition: A | Discharge status: Home / Self Care | Payer: Self-pay | Attending: Emergency Medicine | Admitting: Emergency Medicine

## 2020-05-05 ENCOUNTER — Other Ambulatory Visit: Payer: Self-pay

## 2020-05-05 DIAGNOSIS — R438 Other disturbances of smell and taste: Secondary | ICD-10-CM | POA: Insufficient documentation

## 2020-05-05 DIAGNOSIS — J209 Acute bronchitis, unspecified: Secondary | ICD-10-CM | POA: Insufficient documentation

## 2020-05-05 DIAGNOSIS — R05 Cough: Secondary | ICD-10-CM | POA: Insufficient documentation

## 2020-05-05 DIAGNOSIS — R61 Generalized hyperhidrosis: Secondary | ICD-10-CM | POA: Insufficient documentation

## 2020-05-05 DIAGNOSIS — F17290 Nicotine dependence, other tobacco product, uncomplicated: Secondary | ICD-10-CM | POA: Insufficient documentation

## 2020-05-05 DIAGNOSIS — I10 Essential (primary) hypertension: Secondary | ICD-10-CM | POA: Insufficient documentation

## 2020-05-05 DIAGNOSIS — J45909 Unspecified asthma, uncomplicated: Secondary | ICD-10-CM | POA: Insufficient documentation

## 2020-05-05 DIAGNOSIS — Z79899 Other long term (current) drug therapy: Secondary | ICD-10-CM | POA: Insufficient documentation

## 2020-05-05 DIAGNOSIS — M545 Low back pain: Secondary | ICD-10-CM | POA: Insufficient documentation

## 2020-05-05 DIAGNOSIS — Z20822 Contact with and (suspected) exposure to covid-19: Secondary | ICD-10-CM | POA: Insufficient documentation

## 2020-05-05 NOTE — ED Triage Notes (Signed)
Hx off asthma, using inhaler at home, states worse past couple of days, also having lower back pain.

## 2020-05-06 ENCOUNTER — Emergency Department (HOSPITAL_COMMUNITY): Payer: Self-pay

## 2020-05-06 LAB — SARS CORONAVIRUS 2 BY RT PCR (HOSPITAL ORDER, PERFORMED IN ~~LOC~~ HOSPITAL LAB): SARS Coronavirus 2: NEGATIVE

## 2020-05-06 MED ORDER — PREDNISONE 50 MG PO TABS
60.0000 mg | ORAL_TABLET | Freq: Once | ORAL | Status: AC
  Administered 2020-05-06: 60 mg via ORAL
  Filled 2020-05-06: qty 1

## 2020-05-06 MED ORDER — ALBUTEROL SULFATE HFA 108 (90 BASE) MCG/ACT IN AERS
2.0000 | INHALATION_SPRAY | Freq: Once | RESPIRATORY_TRACT | Status: AC
  Administered 2020-05-06: 2 via RESPIRATORY_TRACT
  Filled 2020-05-06: qty 6.7

## 2020-05-06 MED ORDER — PREDNISONE 20 MG PO TABS: 60.0000 mg | ORAL_TABLET | Freq: Every day | ORAL | 0 refills | Status: DC

## 2020-05-06 NOTE — ED Provider Notes (Signed)
[facility] EMERGENCY DEPARTMENT Provider Note   CSN: [id number] Arrival date & time: 05/05/20  2258   History Chief Complaint  Patient presents with  . Shortness of Breath    Barbara Lam LEVEL is a 29 y.o. female.  The history is provided by the patient.  Shortness of Breath She has history of hypertension, asthma and comes in with 2-day history of shortness of breath and cough productive of some clear to yellow sputum.  She denies fever but has had some chills and sweats.  She has had some aching in her mid and lower back which is worse with coughing.  She denies other arthralgias or myalgias.  She denies any chest pain.  She has noticed that she has lost her sense of smell and taste and also has no appetite.  She denies any sick contacts and specifically denies exposure to COVID-19.  She has not received the COVID-19 vaccination.  She is a cigarette smoker.  Past Medical History:  Diagnosis Date  . Asthma   . Chronic headaches   . Hypertension     There are no problems to display for this patient.   Past Surgical History:  Procedure Laterality Date  . EYE SURGERY       OB History    Gravida  0   Para  0   Term  0   Preterm      AB      Living        SAB      TAB      Ectopic      Multiple      Live Births              Family History  Problem Relation Age of Onset  . Diabetes Other   . Asthma Mother   . Healthy Father     Social History   Tobacco Use  . Smoking status: Current Some Day Smoker    Years: 3.00    Types: Cigars  . Smokeless tobacco: Never Used  Substance Use Topics  . Alcohol use: Yes    Comment: Socially   . Drug use: No    Home Medications Prior to Admission medications   Medication Sig Start Date End Date Taking? Authorizing Provider  albuterol (VENTOLIN HFA) 108 (90 Base) MCG/ACT inhaler Inhale 1-2 puffs into the lungs every 6 (six) hours as needed for wheezing or shortness of breath. 04/04/19   Wieters, Hallie C,  PA-C  ALLERGY RELIEF 10 MG tablet Take 10 mg by mouth daily. 01/11/20   [provider]  amoxicillin (AMOXIL) 500 MG capsule Take 1 capsule (500 mg total) by mouth 3 (three) times daily. 03/26/20   Rolland Porter, MD  benzonatate (TESSALON) 100 MG capsule Take 1 capsule (100 mg total) by mouth every 8 (eight) hours. 05/24/19   Orvan July, NP  COVID-19 Specimen Collection KIT See admin instructions. for testing 02/23/20   [provider]  cyclobenzaprine (FLEXERIL) 10 MG tablet Take 1 tablet (10 mg total) by mouth 3 (three) times daily as needed. 10/20/18   Triplett, Tammy, PA-C  fluticasone (FLONASE) 50 MCG/ACT nasal spray  03/01/20   [provider]  ibuprofen (ADVIL) 800 MG tablet Take 1 tablet (800 mg total) by mouth every 8 (eight) hours as needed for moderate pain. 02/05/20   Faustino Congress, NP  medroxyPROGESTERone Acetate (DEPO-PROVERA IM) Inject into the muscle.    [provider]  metroNIDAZOLE (FLAGYL) 500 MG tablet Take  1 tablet (500 mg total) by mouth 2 (two) times daily. 04/08/20   Rolland Porter, MD  montelukast (SINGULAIR) 10 MG tablet Take 10 mg by mouth daily. 01/11/20   [provider]  predniSONE (DELTASONE) 20 MG tablet Take 3 po QD x 3d , then 2 po QD x 3d then 1 po QD x 3d 03/26/20   Rolland Porter, MD  triamcinolone cream (KENALOG) 0.1 % Apply 1 application topically 2 (two) times daily. 02/02/19   Rosemarie Ax, MD  amLODipine (NORVASC) 5 MG tablet Take 5 mg by mouth daily. 03/01/20 04/21/20  [provider]    Allergies    Lisinopril and Tomato  Review of Systems   Review of Systems  Respiratory: Positive for shortness of breath.   All other systems reviewed and are negative.   Physical Exam Updated Vital Signs BP 115/71 (BP Location: Right Arm)   Pulse 97   Temp 98.1 F (36.7 C)   Resp 15   Ht '5\' 2"'  (1.575 m)   Wt 90.3 kg   SpO2 100%   BMI 36.40 kg/m   Physical Exam Vitals and nursing note reviewed.   29 year old  female, resting comfortably and in no acute distress. Vital signs are normal. Oxygen saturation is 100%, which is normal. Head is normocephalic and atraumatic. PERRLA, EOMI. Oropharynx is clear. Neck is nontender and supple without adenopathy or JVD. Back is nontender and there is no CVA tenderness. Lungs are clear without rales, wheezes, or rhonchi. Chest is nontender. Heart has regular rate and rhythm without murmur. Abdomen is soft, flat, nontender without masses or hepatosplenomegaly and peristalsis is normoactive. Extremities have no cyanosis or edema, full range of motion is present. Skin is warm and dry without rash. Neurologic: Mental status is normal, cranial nerves are intact, there are no motor or sensory deficits.  ED Results / Procedures / Treatments   Labs (all labs ordered are listed, but only abnormal results are displayed) Labs Reviewed  SARS CORONAVIRUS 2 BY RT PCR (HOSPITAL ORDER, [place] [facility])   Radiology DG Chest Port 1 View  Result Date: 05/06/2020 CLINICAL DATA:  Cough EXAM: PORTABLE CHEST 1 VIEW COMPARISON:  03/26/2020 FINDINGS: The heart size and mediastinal contours are within normal limits. Both lungs are clear. The visualized skeletal structures are unremarkable. IMPRESSION: No active disease. Electronically Signed   By: Donavan Foil M.D.   On: 05/06/2020 01:03    Procedures Procedures  Medications Ordered in ED Medications - No data to display  ED Course  I have reviewed the triage vital signs and the nursing notes.  Pertinent labs & imaging results that were available during my care of the patient were reviewed by me and considered in my medical decision making (see chart for details).  MDM Rules/Calculators/A&P Shortness of breath with cough and loss of sense of smell and taste strongly suggestive of COVID-19 infection.  Oxygen saturations are excellent.  Will check chest x-ray and give therapeutic trial of albuterol.   COVID-19 test is sent.  Old records are reviewed, and she does have several ED visits for respiratory tract infections.  Chest x-ray shows no evidence of pneumonia.  COVID-19 PCR test is negative.  She states she feels significantly better following albuterol.  She is given a dose of prednisone and discharged with prescription for prednisone.  She is to continue using albuterol as needed.  Return precautions discussed.  Barbara Lam was evaluated in Emergency Department on 05/06/2020  for the symptoms described in the history of present illness. She was evaluated in the context of the global COVID-19 pandemic, which necessitated consideration that the patient might be at risk for infection with the SARS-CoV-2 virus that causes COVID-19. Institutional protocols and algorithms that pertain to the evaluation of patients at risk for COVID-19 are in a state of rapid change based on information released by regulatory bodies including the CDC and federal and state organizations. These policies and algorithms were followed during the patient's care in the ED.  Final Clinical Impression(s) / ED Diagnoses Final diagnoses:  Acute bronchitis, unspecified organism    Rx / DC Orders ED Discharge Orders         Ordered    predniSONE (DELTASONE) 20 MG tablet  Daily     05/06/20 0459           Delora Fuel, MD 13/68/59 0151

## 2020-05-06 NOTE — Discharge Instructions (Addendum)
Your chest x-ray did not show any sign of pneumonia.  Your COVID-19 test was negative.  Use the inhaler every 4 hours as needed to treat the cough or shortness of breath.  Return if your symptoms are getting worse.

## 2020-05-17 ENCOUNTER — Ambulatory Visit
Admission: EM | Admit: 2020-05-17 | Discharge: 2020-05-17 | Disposition: A | Discharge status: Home / Self Care | Payer: Self-pay

## 2020-05-17 ENCOUNTER — Other Ambulatory Visit: Payer: Self-pay

## 2020-05-17 ENCOUNTER — Encounter: Payer: Self-pay | Admitting: Emergency Medicine

## 2020-05-17 DIAGNOSIS — Z1152 Encounter for screening for COVID-19: Secondary | ICD-10-CM

## 2020-05-17 DIAGNOSIS — J069 Acute upper respiratory infection, unspecified: Secondary | ICD-10-CM

## 2020-05-17 MED ORDER — CETIRIZINE HCL 10 MG PO TABS: 10.0000 mg | ORAL_TABLET | Freq: Every day | ORAL | 0 refills | Status: AC

## 2020-05-17 MED ORDER — BENZONATATE 100 MG PO CAPS
100.0000 mg | ORAL_CAPSULE | Freq: Three times a day (TID) | ORAL | 0 refills | Status: DC
Start: 2020-05-17 — End: 2021-08-07

## 2020-05-17 MED ORDER — PREDNISONE 10 MG PO TABS
20.0000 mg | ORAL_TABLET | Freq: Every day | ORAL | 0 refills | Status: DC
Start: 2020-05-17 — End: 2020-12-12

## 2020-05-17 MED ORDER — FLUTICASONE PROPIONATE 50 MCG/ACT NA SUSP
1.0000 | Freq: Every day | NASAL | 0 refills | Status: DC
Start: 2020-05-17 — End: 2021-08-07

## 2020-05-17 NOTE — Discharge Instructions (Signed)

## 2020-05-17 NOTE — ED Triage Notes (Signed)
Cough, runny nose, sneezing and wheezing and neck pain since Sunday.  Pt has been taking allergy meds but they are not doing any good.  Pt unable to go to work last night.

## 2020-05-17 NOTE — ED Provider Notes (Signed)
[facility]    CSN: [id number] Arrival date & time: 05/17/20  1506      History   Chief Complaint Chief Complaint  Patient presents with   Cough    HPI Barbara Lam is a 29 y.o. female.   Who presented to the urgent care with a complaint of cough, runny nose, congestion and wheezing for the past few days.  Denies sick exposure to COVID, flu or strep.  Denies recent travel.  Denies aggravating or alleviating symptoms.  Denies previous COVID infection.   Denies fever, chills, fatigue, nasal congestion, rhinorrhea, sore throat,  SOB, wheezing, chest pain, nausea, vomiting, changes in bowel or bladder habits.    The history is provided by the patient. No language interpreter was used.  Cough Associated symptoms: fever, rhinorrhea and wheezing     Past Medical History:  Diagnosis Date   Asthma    Chronic headaches    Hypertension     There are no problems to display for this patient.   Past Surgical History:  Procedure Laterality Date   EYE SURGERY      OB History    Gravida  0   Para  0   Term  0   Preterm      AB      Living        SAB      TAB      Ectopic      Multiple      Live Births               Home Medications    Prior to Admission medications   Medication Sig Start Date End Date Taking? Authorizing Provider  albuterol (VENTOLIN HFA) 108 (90 Base) MCG/ACT inhaler Inhale 1-2 puffs into the lungs every 6 (six) hours as needed for wheezing or shortness of breath. 04/04/19  Yes Wieters, Hallie C, PA-C  ALLERGY RELIEF 10 MG tablet Take 10 mg by mouth daily. 01/11/20  Yes [provider]  amLODipine (NORVASC) 5 MG tablet Take 5 mg by mouth daily.   Yes [provider]  ibuprofen (ADVIL) 800 MG tablet Take 1 tablet (800 mg total) by mouth every 8 (eight) hours as needed for moderate pain. 02/05/20  Yes Faustino Congress, NP  medroxyPROGESTERone Acetate (DEPO-PROVERA IM) Inject into the muscle.   Yes  [provider]  montelukast (SINGULAIR) 10 MG tablet Take 10 mg by mouth daily. 01/11/20  Yes [provider]  benzonatate (TESSALON) 100 MG capsule Take 1 capsule (100 mg total) by mouth every 8 (eight) hours. 05/17/20   Timika Muench, Darrelyn Hillock, FNP  cetirizine (ZYRTEC ALLERGY) 10 MG tablet Take 1 tablet (10 mg total) by mouth daily. 05/17/20   Emerson Monte, FNP  COVID-19 Specimen Collection KIT See admin instructions. for testing 02/23/20   [provider]  fluticasone (FLONASE) 50 MCG/ACT nasal spray Place 1 spray into both nostrils daily for 14 days. 05/17/20 05/31/20  Yechiel Erny, Darrelyn Hillock, FNP  predniSONE (DELTASONE) 10 MG tablet Take 2 tablets (20 mg total) by mouth daily. 05/17/20   Yanet Balliet, Darrelyn Hillock, FNP  triamcinolone cream (KENALOG) 0.1 % Apply 1 application topically 2 (two) times daily. 02/02/19   Rosemarie Ax, MD    Family History Family History  Problem Relation Age of Onset   Diabetes Other    Asthma Mother    Healthy Father     Social History Social History   Tobacco Use   Smoking status:  Current Some Day Smoker    Years: 3.00    Types: Cigars   Smokeless tobacco: Never Used  Substance Use Topics   Alcohol use: Yes    Comment: Socially    Drug use: No     Allergies   Lisinopril and Tomato   Review of Systems Review of Systems  Constitutional: Positive for fever.  HENT: Positive for congestion, rhinorrhea and sneezing.   Respiratory: Positive for cough and wheezing.   Cardiovascular: Negative.   Gastrointestinal: Negative.   Neurological: Negative.   All other systems reviewed and are negative.    Physical Exam Triage Vital Signs ED Triage Vitals  Enc Vitals Group     BP 05/17/20 1526 116/80     Pulse Rate 05/17/20 1526 (!) 101     Resp 05/17/20 1526 17     Temp 05/17/20 1526 99.3 F (37.4 C)     Temp Source 05/17/20 1526 Oral     SpO2 05/17/20 1526 96 %     Weight 05/17/20 1528 198 lb 6.6 oz (90 kg)     Height  05/17/20 1528 '5\' 2"'  (1.575 m)     Head Circumference --      Peak Flow --      Pain Score 05/17/20 1528 10     Pain Loc --      Pain Edu? --      Excl. in [place]? --    No data found.  Updated Vital Signs BP 116/80 (BP Location: Right Arm)    Pulse (!) 101    Temp 99.3 F (37.4 C) (Oral)    Resp 17    Ht '5\' 2"'  (1.575 m)    Wt 198 lb 6.6 oz (90 kg)    SpO2 96%    BMI 36.29 kg/m   Visual Acuity Right Eye Distance:   Left Eye Distance:   Bilateral Distance:    Right Eye Near:   Left Eye Near:    Bilateral Near:     Physical Exam Vitals and nursing note reviewed.  Constitutional:      General: She is not in acute distress.    Appearance: Normal appearance. She is normal weight. She is not ill-appearing, toxic-appearing or diaphoretic.  HENT:     Head: Normocephalic.     Right Ear: Tympanic membrane, ear canal and external ear normal. There is no impacted cerumen.     Left Ear: Tympanic membrane, ear canal and external ear normal. There is no impacted cerumen.     Nose: Congestion and rhinorrhea present.     Mouth/Throat:     Mouth: Mucous membranes are moist.     Pharynx: Oropharynx is clear. No oropharyngeal exudate or posterior oropharyngeal erythema.  Cardiovascular:     Rate and Rhythm: Regular rhythm. Tachycardia present.     Pulses: Normal pulses.     Heart sounds: Normal heart sounds. No murmur.  Pulmonary:     Effort: Pulmonary effort is normal. No respiratory distress.     Breath sounds: Normal breath sounds. No wheezing or rhonchi.  Chest:     Chest wall: No tenderness.  Neurological:     Mental Status: She is alert and oriented to person, place, and time.      UC Treatments / Results  Labs (all labs ordered are listed, but only abnormal results are displayed) Labs Reviewed  NOVEL CORONAVIRUS, NAA    EKG   Radiology No results found.  Procedures Procedures (including critical care time)  Medications  Ordered in UC Medications - No data to  display  Initial Impression / Assessment and Plan / UC Course  I have reviewed the triage vital signs and the nursing notes.  Pertinent labs & imaging results that were available during my care of the patient were reviewed by me and considered in my medical decision making (see chart for details).    Patient is stable at discharge.  COVID-19 test was completed and patient was advised to quarantine until result become available.  Prednisone, Flexeril, Zyrtec, Flonase were prescribed for symptomatic relief.  Work note was given.  Final Clinical Impressions(s) / UC Diagnoses   Final diagnoses:  Encounter for screening for COVID-19  Viral URI with cough     Discharge Instructions     COVID testing ordered.  It will take between 2-7 days for test results.  Someone will contact you regarding abnormal results.    In the meantime: You should remain isolated in your home for 10 days from symptom onset AND greater than 24 hours after symptoms resolution (absence of fever without the use of fever-reducing medication and improvement in respiratory symptoms), whichever is longer Get plenty of rest and push fluids Tessalon Perles prescribed for cough Zyrtec-D prescribed for nasal congestion, runny nose, and/or sore throat Flonase prescribed for nasal congestion and runny nose Use medications daily for symptom relief Use OTC medications like ibuprofen or tylenol as needed fever or pain Call or go to the ED if you have any new or worsening symptoms such as fever, worsening cough, shortness of breath, chest tightness, chest pain, turning blue, changes in mental status, etc...     ED Prescriptions    Medication Sig Dispense Auth. Provider   fluticasone (FLONASE) 50 MCG/ACT nasal spray Place 1 spray into both nostrils daily for 14 days. 16 g Ravynn Hogate, Darrelyn Hillock, FNP   cetirizine (ZYRTEC ALLERGY) 10 MG tablet Take 1 tablet (10 mg total) by mouth daily. 30 tablet Keane Martelli, Darrelyn Hillock, FNP   benzonatate  (TESSALON) 100 MG capsule Take 1 capsule (100 mg total) by mouth every 8 (eight) hours. 30 capsule Cesario Weidinger S, FNP   predniSONE (DELTASONE) 10 MG tablet Take 2 tablets (20 mg total) by mouth daily. 15 tablet Yamil Dougher, Darrelyn Hillock, FNP     PDMP not reviewed this encounter.   Emerson Monte, FNP 05/17/20 [phone]

## 2020-05-18 LAB — SARS-COV-2, NAA 2 DAY TAT

## 2020-05-18 LAB — NOVEL CORONAVIRUS, NAA: SARS-CoV-2, NAA: NOT DETECTED

## 2020-06-24 IMAGING — US US PELVIS COMPLETE TRANSABD/TRANSVAG
1 series · 13 of 25 positions shown · non-contrast
Comparison: None

CLINICAL DATA: Dysmenorrhea.  Menorrhagia.



[Series 1: us pelvis complete transabd/transvag · 0.24mm/px · 13 of 60 slices shown]
[im 1/60]
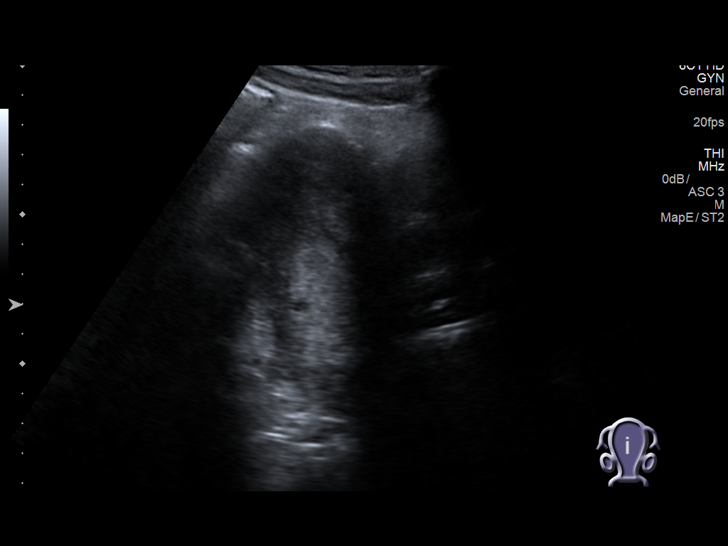
[im 5/60]
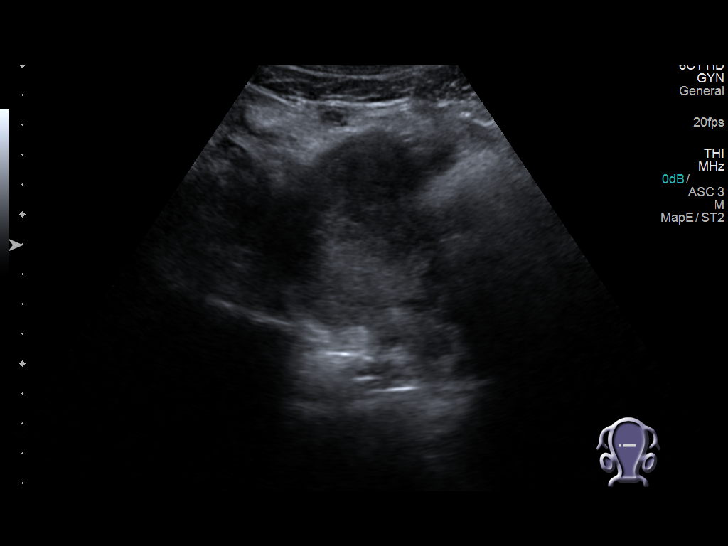
[im 10/60]
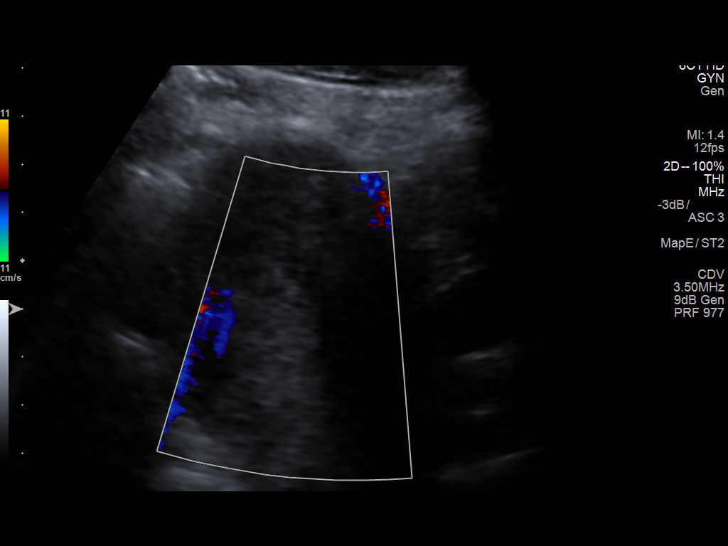
[im 15/60]
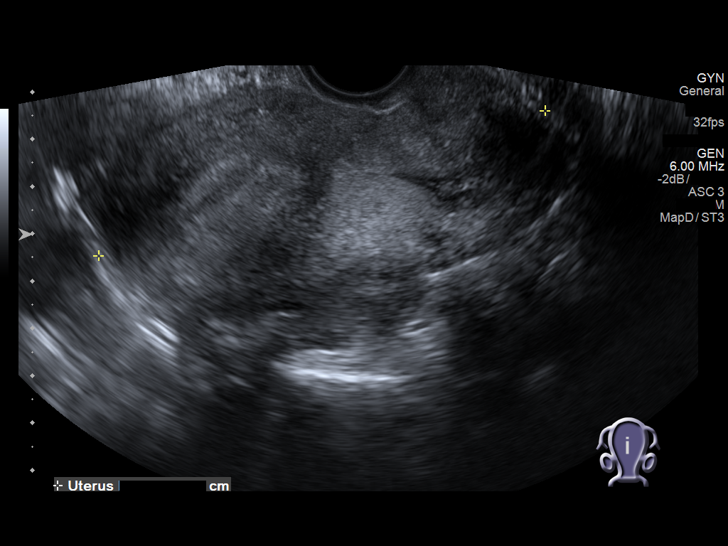
[im 20/60]
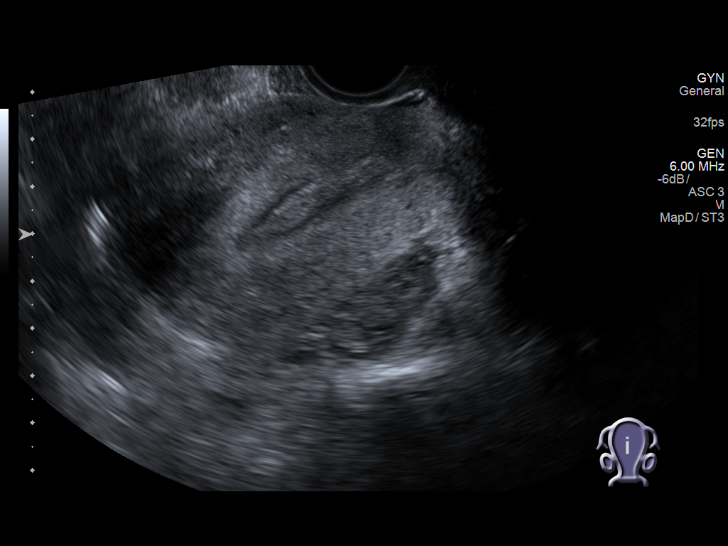
[im 25/60]
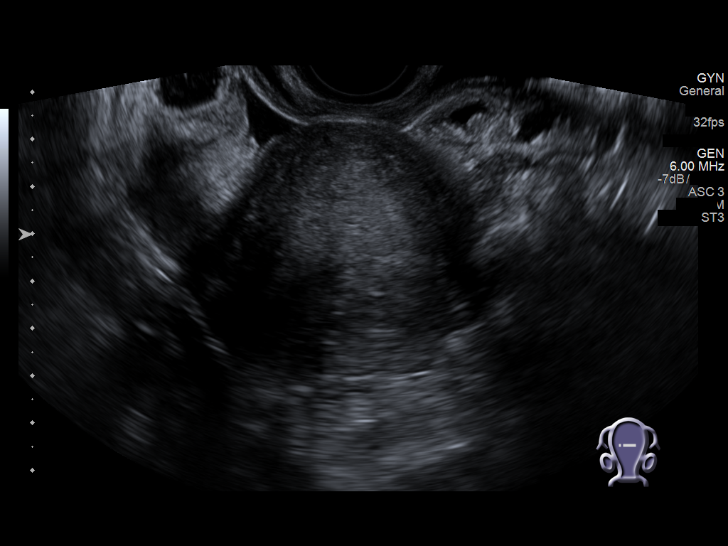
[im 30/60]
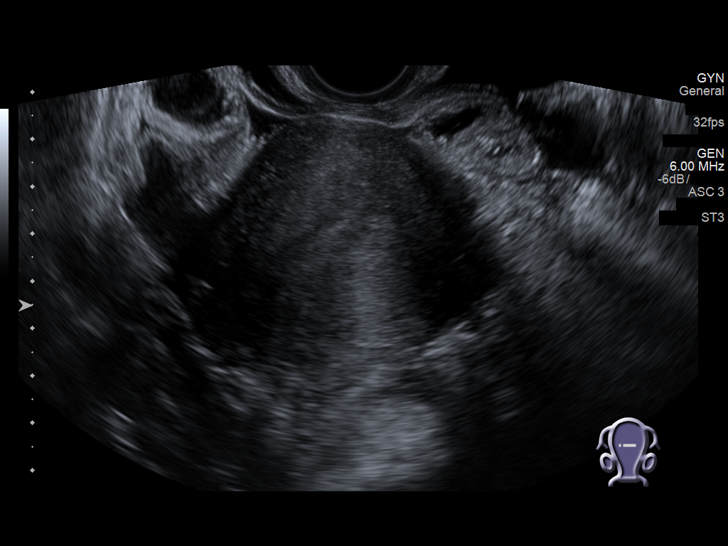
[im 35/60]
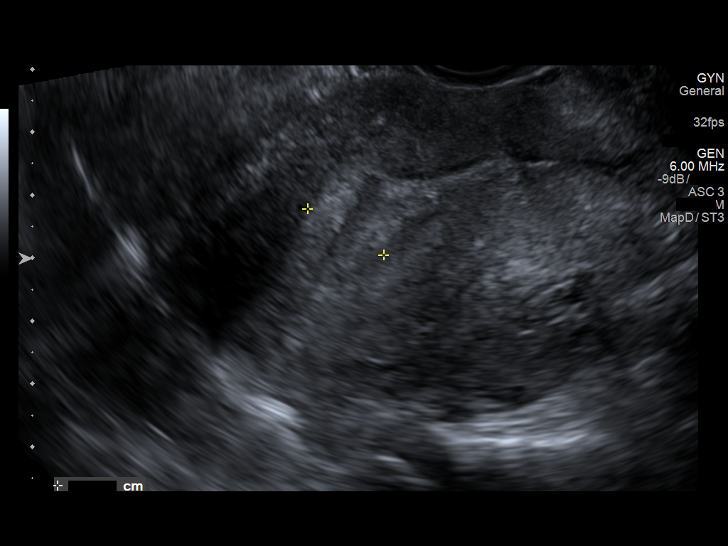
[im 40/60]
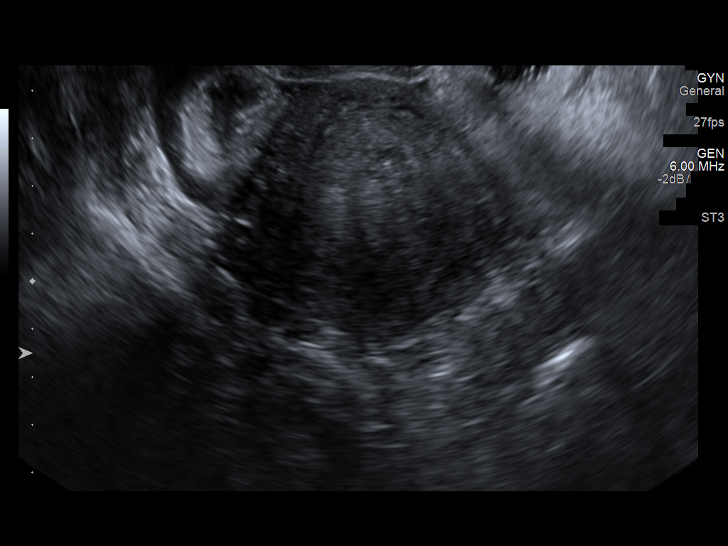
[im 45/60]
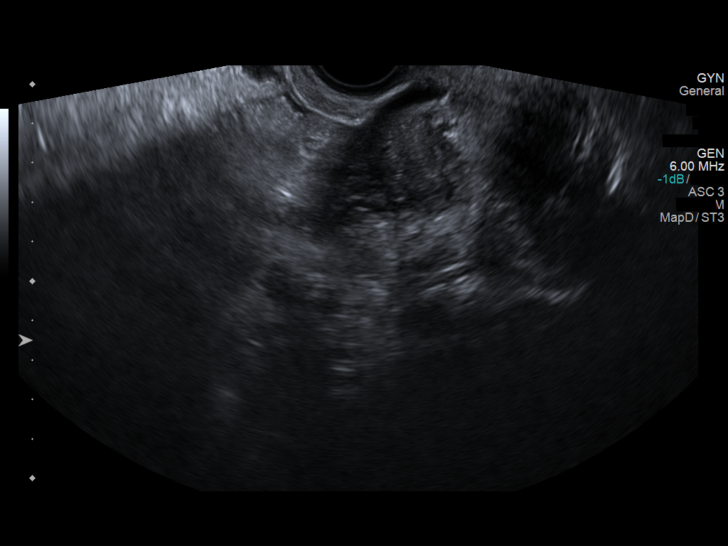
[im 50/60]
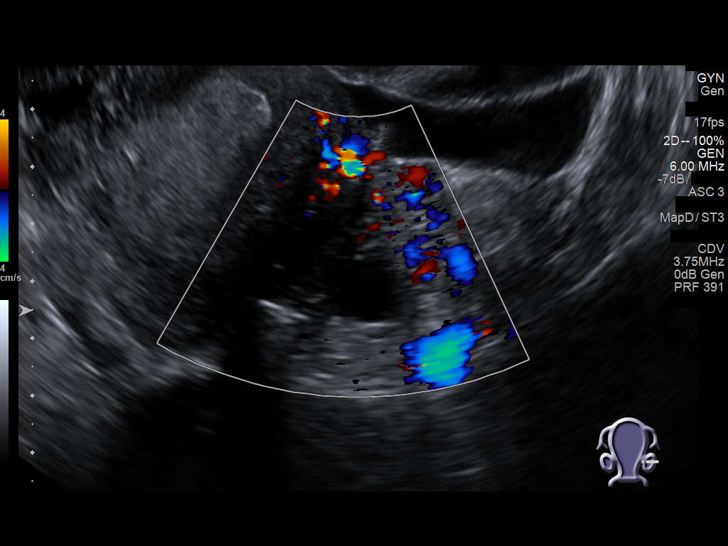
[im 55/60]
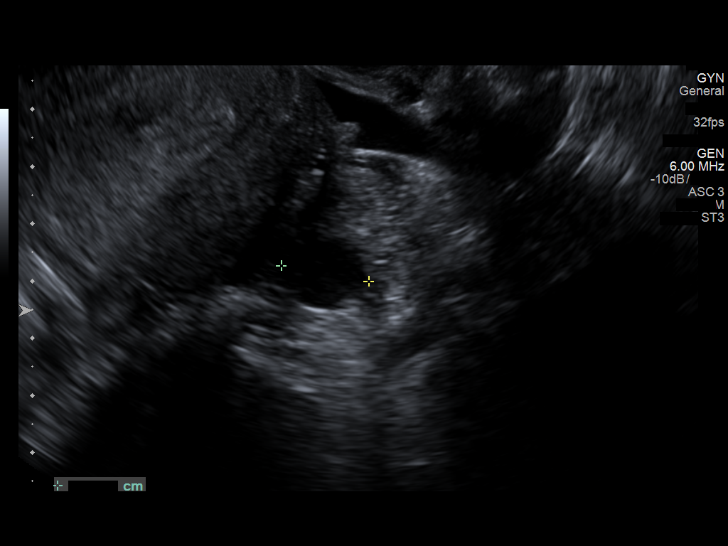
[im 60/60]
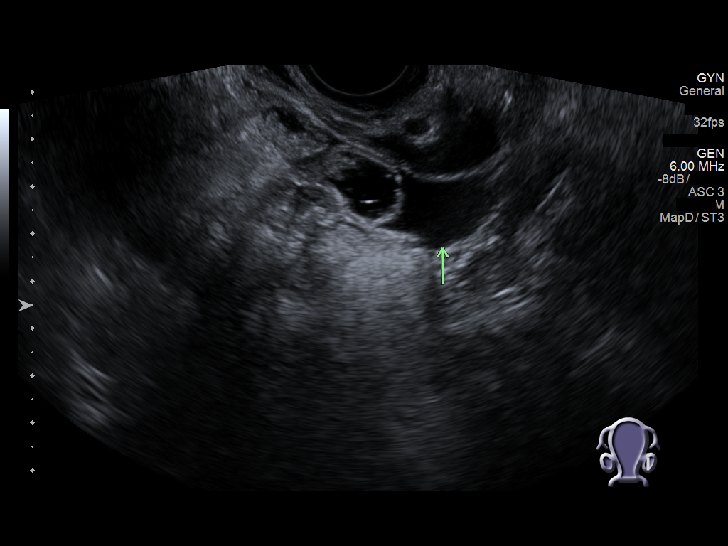

[13 of 25 positions shown; findings below may reference images not displayed]

FINDINGS: Uterus

Measurements: 9.9 x 4.1 x 5.2 cm. No fibroids or other mass
visualized.

Endometrium

Thickness: 14 mm.  No focal abnormality visualized.

Right ovary

Measurements: 2.3 x 4.1 x 2.9 cm. Normal appearance/no adnexal mass.

Left ovary

Measurements: 2.6 x 4.2 x 2.7 cm. Within the left ovary there is a
1.8 x 1.3 x 1.6 cm cyst.

Other findings

Small amount of free fluid in the pelvis.
IMPRESSION: Endometrium measures 14 mm. If bleeding remains unresponsive to
hormonal or medical therapy, sonohysterogram should be considered
for focal lesion work-up. (Ref: Radiological Reasoning: Algorithmic
Workup of Abnormal Vaginal Bleeding with Endovaginal Sonography and
Sonohysterography. AJR [id number]; [id number]:[id number])

## 2020-07-25 ENCOUNTER — Ambulatory Visit: Payer: Self-pay | Admitting: Obstetrics & Gynecology

## 2020-07-26 ENCOUNTER — Encounter: Payer: Self-pay | Admitting: Obstetrics & Gynecology

## 2020-07-26 ENCOUNTER — Ambulatory Visit (INDEPENDENT_AMBULATORY_CARE_PROVIDER_SITE_OTHER): Payer: Managed Care, Other (non HMO) | Admitting: Obstetrics & Gynecology

## 2020-07-26 VITALS — BP 129/89 | HR 107 | Ht 62.0 in | Wt 197.0 lb

## 2020-07-26 DIAGNOSIS — N941 Unspecified dyspareunia: Secondary | ICD-10-CM

## 2020-07-26 DIAGNOSIS — N946 Dysmenorrhea, unspecified: Secondary | ICD-10-CM | POA: Diagnosis not present

## 2020-07-26 DIAGNOSIS — N921 Excessive and frequent menstruation with irregular cycle: Secondary | ICD-10-CM | POA: Diagnosis not present

## 2020-07-26 DIAGNOSIS — N809 Endometriosis, unspecified: Secondary | ICD-10-CM | POA: Diagnosis not present

## 2020-07-26 MED ORDER — KETOROLAC TROMETHAMINE 10 MG PO TABS: 10.0000 mg | ORAL_TABLET | Freq: Three times a day (TID) | ORAL | 0 refills | Status: DC | PRN

## 2020-07-26 MED ORDER — ORILISSA 200 MG PO TABS: 1.0000 | ORAL_TABLET | Freq: Two times a day (BID) | ORAL | 5 refills | Status: DC

## 2020-07-26 NOTE — Progress Notes (Signed)
Chief Complaint  Patient presents with  . Referral    talk period managment      29 y.o. G0P0000 Patient's last menstrual period was 07/04/2020. The current method of family planning is Depo-Provera injections.  Outpatient Encounter Medications as of 07/26/2020  Medication Sig  . albuterol (VENTOLIN HFA) 108 (90 Base) MCG/ACT inhaler Inhale 1-2 puffs into the lungs every 6 (six) hours as needed for wheezing or shortness of breath.  . ALLERGY RELIEF 10 MG tablet Take 10 mg by mouth daily.  Marland Kitchen amLODipine (NORVASC) 5 MG tablet Take 5 mg by mouth daily.  . cetirizine (ZYRTEC ALLERGY) 10 MG tablet Take 1 tablet (10 mg total) by mouth daily.  Marland Kitchen ibuprofen (ADVIL) 800 MG tablet Take 1 tablet (800 mg total) by mouth every 8 (eight) hours as needed for moderate pain.  . medroxyPROGESTERone Acetate (DEPO-PROVERA IM) Inject into the muscle.  . montelukast (SINGULAIR) 10 MG tablet Take 10 mg by mouth daily.  . benzonatate (TESSALON) 100 MG capsule Take 1 capsule (100 mg total) by mouth every 8 (eight) hours. (Patient not taking: Reported on 07/26/2020)  . COVID-19 Specimen Collection KIT See admin instructions. for testing (Patient not taking: Reported on 07/26/2020)  . Elagolix Sodium (ORILISSA) 200 MG TABS Take 1 tablet by mouth 2 (two) times daily.  . fluticasone (FLONASE) 50 MCG/ACT nasal spray Place 1 spray into both nostrils daily for 14 days.  Marland Kitchen ketorolac (TORADOL) 10 MG tablet Take 1 tablet (10 mg total) by mouth every 8 (eight) hours as needed.  . predniSONE (DELTASONE) 10 MG tablet Take 2 tablets (20 mg total) by mouth daily. (Patient not taking: Reported on 07/26/2020)  . triamcinolone cream (KENALOG) 0.1 % Apply 1 application topically 2 (two) times daily. (Patient not taking: Reported on 07/26/2020)   No facility-administered encounter medications on file as of 07/26/2020.    Subjective Pt with primary dysmenorrhea Primary dysmenorrhea Primary dyspareunia  Worsening  menometrorrhgai/dysmenorrhea over the past few years, especially since 2019  Has tried OCP, Depo provera, Mirena IUD(could not place), and megestrol all unsuccessfully to manage either the bleeding or the pain  In reviewing her records I believe she may have endometriosis and feel an empiric trial of higher dose orilissa 200 mg BID is indicated  Pt is not interested in children and she only has same sex partners  Past Medical History:  Diagnosis Date  . Asthma   . Chronic headaches   . Hypertension     Past Surgical History:  Procedure Laterality Date  . EYE SURGERY      OB History    Gravida  0   Para  0   Term  0   Preterm      AB      Living        SAB      TAB      Ectopic      Multiple      Live Births              Allergies  Allergen Reactions  . Lisinopril   . Tomato Hives    Social History   Socioeconomic History  . Marital status: Single    Spouse name: Not on file  . Number of children: Not on file  . Years of education: Not on file  . Highest education level: Not on file  Occupational History  . Not on file  Tobacco Use  . Smoking status: Current Some  Day Smoker    Years: 3.00    Types: Cigars  . Smokeless tobacco: Never Used  Vaping Use  . Vaping Use: Never used  Substance and Sexual Activity  . Alcohol use: Yes    Comment: Socially   . Drug use: No  . Sexual activity: Not Currently    Birth control/protection: Injection  Other Topics Concern  . Not on file  Social History Narrative  . Not on file   Social Determinants of Health   Financial Resource Strain:   . Difficulty of Paying Living Expenses:   Food Insecurity:   . Worried About Charity fundraiser in the Last Year:   . Arboriculturist in the Last Year:   Transportation Needs:   . Film/video editor (Medical):   Marland Kitchen Lack of Transportation (Non-Medical):   Physical Activity:   . Days of Exercise per Week:   . Minutes of Exercise per Session:   Stress:    . Feeling of Stress :   Social Connections:   . Frequency of Communication with Friends and Family:   . Frequency of Social Gatherings with Friends and Family:   . Attends Religious Services:   . Active Member of Clubs or Organizations:   . Attends Archivist Meetings:   Marland Kitchen Marital Status:     Family History  Problem Relation Age of Onset  . Diabetes Other   . Asthma Mother   . Healthy Father     Medications:       Current Outpatient Medications:  .  albuterol (VENTOLIN HFA) 108 (90 Base) MCG/ACT inhaler, Inhale 1-2 puffs into the lungs every 6 (six) hours as needed for wheezing or shortness of breath., Disp: 1 Inhaler, Rfl: 0 .  ALLERGY RELIEF 10 MG tablet, Take 10 mg by mouth daily., Disp: , Rfl:  .  amLODipine (NORVASC) 5 MG tablet, Take 5 mg by mouth daily., Disp: , Rfl:  .  cetirizine (ZYRTEC ALLERGY) 10 MG tablet, Take 1 tablet (10 mg total) by mouth daily., Disp: 30 tablet, Rfl: 0 .  ibuprofen (ADVIL) 800 MG tablet, Take 1 tablet (800 mg total) by mouth every 8 (eight) hours as needed for moderate pain., Disp: 21 tablet, Rfl: 0 .  medroxyPROGESTERone Acetate (DEPO-PROVERA IM), Inject into the muscle., Disp: , Rfl:  .  montelukast (SINGULAIR) 10 MG tablet, Take 10 mg by mouth daily., Disp: , Rfl:  .  benzonatate (TESSALON) 100 MG capsule, Take 1 capsule (100 mg total) by mouth every 8 (eight) hours. (Patient not taking: Reported on 07/26/2020), Disp: 30 capsule, Rfl: 0 .  COVID-19 Specimen Collection KIT, See admin instructions. for testing (Patient not taking: Reported on 07/26/2020), Disp: , Rfl:  .  Elagolix Sodium (ORILISSA) 200 MG TABS, Take 1 tablet by mouth 2 (two) times daily., Disp: 60 tablet, Rfl: 5 .  fluticasone (FLONASE) 50 MCG/ACT nasal spray, Place 1 spray into both nostrils daily for 14 days., Disp: 16 g, Rfl: 0 .  ketorolac (TORADOL) 10 MG tablet, Take 1 tablet (10 mg total) by mouth every 8 (eight) hours as needed., Disp: 15 tablet, Rfl: 0 .  predniSONE  (DELTASONE) 10 MG tablet, Take 2 tablets (20 mg total) by mouth daily. (Patient not taking: Reported on 07/26/2020), Disp: 15 tablet, Rfl: 0 .  triamcinolone cream (KENALOG) 0.1 %, Apply 1 application topically 2 (two) times daily. (Patient not taking: Reported on 07/26/2020), Disp: 30 g, Rfl: 0  Objective Blood pressure 129/89, pulse Marland Kitchen)  107, height '5\' 2"'  (1.575 m), weight 197 lb (89.4 kg), last menstrual period 07/04/2020.  General WDWN female NAD Vulva:  normal appearing vulva with no masses, tenderness or lesions Vagina:  normal mucosa, no discharge Cervix:  Normal no lesions Uterus:  normal size, contour, position, consistency, mobility, non-tender Adnexa: ovaries:present,  normal adnexa in size, nontender and no masses   Pertinent ROS No burning with urination, frequency or urgency No nausea, vomiting or diarrhea Nor fever chills or other constitutional symptoms   Labs or studies Reviewed sonogram and labs    Impression Diagnoses this Encounter::   ICD-10-CM   1. Endometriosis  N80.9    empirically diagnosed  2. Menometrorrhagia  N92.1   3. Dysmenorrhea  N94.6   4. Dyspareunia, female  N94.10     Established relevant diagnosis(es):   Plan/Recommendations: Meds ordered this encounter  Medications  . Elagolix Sodium (ORILISSA) 200 MG TABS    Sig: Take 1 tablet by mouth 2 (two) times daily.    Dispense:  60 tablet    Refill:  5  . ketorolac (TORADOL) 10 MG tablet    Sig: Take 1 tablet (10 mg total) by mouth every 8 (eight) hours as needed.    Dispense:  15 tablet    Refill:  0    Labs or Scans Ordered: No orders of the defined types were placed in this encounter.   Management:: Will try empiric trial of orilissa which should improve her pain and bleeding within 2-4 weeks but up to 3 months, my experience is it works quicker  Follow up Return in about 3 months (around 10/26/2020) for Follow up, with Dr Elonda Husky.     All questions were answered.

## 2020-07-28 ENCOUNTER — Telehealth: Payer: Self-pay | Admitting: Obstetrics & Gynecology

## 2020-07-28 NOTE — Telephone Encounter (Signed)
Patient called stating that her pharmacy sent over a request for a prior authorization on Tuesday. Pt would like to know if this was done. Please give the patient a call when Prior Barbara Lam has been done.

## 2020-07-28 NOTE — Telephone Encounter (Signed)
Pt informed that PA has been completed. Waiting on approval.

## 2020-08-02 ENCOUNTER — Telehealth: Payer: Self-pay | Admitting: Obstetrics & Gynecology

## 2020-08-02 NOTE — Telephone Encounter (Signed)
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

## 2020-08-05 ENCOUNTER — Other Ambulatory Visit: Payer: Managed Care, Other (non HMO)

## 2020-08-05 ENCOUNTER — Other Ambulatory Visit: Payer: Self-pay | Admitting: Obstetrics & Gynecology

## 2020-08-09 ENCOUNTER — Telehealth: Payer: Self-pay | Admitting: Adult Health

## 2020-08-09 ENCOUNTER — Other Ambulatory Visit: Payer: Self-pay

## 2020-08-09 DIAGNOSIS — Z029 Encounter for administrative examinations, unspecified: Secondary | ICD-10-CM

## 2020-08-09 MED ORDER — KETOROLAC TROMETHAMINE 10 MG PO TABS: 10.0000 mg | ORAL_TABLET | Freq: Three times a day (TID) | ORAL | 0 refills | Status: DC | PRN

## 2020-08-09 NOTE — Telephone Encounter (Signed)
It was refilled earlier today

## 2020-08-09 NOTE — Telephone Encounter (Signed)
Telephoned patient at home number. Patient states is having pain from endometriosis and would like Toradol refilled. Advised patient would send to provider and to check with pharmacy later. Patient voiced understanding.

## 2020-08-09 NOTE — Telephone Encounter (Signed)
Patient states she is in a lot of pain and would like something sent to pharmacy for pain.

## 2020-10-03 ENCOUNTER — Telehealth: Payer: Self-pay | Admitting: Obstetrics & Gynecology

## 2020-10-03 MED ORDER — MEGESTROL ACETATE 40 MG PO TABS: ORAL_TABLET | ORAL | 3 refills | Status: DC

## 2020-10-03 MED ORDER — KETOROLAC TROMETHAMINE 10 MG PO TABS: 10.0000 mg | ORAL_TABLET | Freq: Three times a day (TID) | ORAL | 0 refills | Status: DC | PRN

## 2020-10-03 NOTE — Telephone Encounter (Signed)
Pt calling in a lot of pain & bleeding heavy, states we ordered pain meds for her before Requesting something for pain & to stop the bleeding, trying to avoid the ER  Please advise & call pt  [facility]

## 2020-10-03 NOTE — Telephone Encounter (Signed)
Megace + toradol

## 2020-10-27 ENCOUNTER — Ambulatory Visit: Payer: Managed Care, Other (non HMO) | Admitting: Obstetrics & Gynecology

## 2020-11-08 ENCOUNTER — Encounter (HOSPITAL_COMMUNITY): Payer: Self-pay | Admitting: *Deleted

## 2020-11-08 ENCOUNTER — Emergency Department (HOSPITAL_COMMUNITY): Payer: Managed Care, Other (non HMO)

## 2020-11-08 ENCOUNTER — Emergency Department (HOSPITAL_COMMUNITY)
Admission: EM | Admit: 2020-11-08 | Discharge: 2020-11-09 | Disposition: A | Discharge status: Home / Self Care | Payer: Managed Care, Other (non HMO) | Attending: Emergency Medicine | Admitting: Emergency Medicine

## 2020-11-08 ENCOUNTER — Other Ambulatory Visit: Payer: Self-pay

## 2020-11-08 DIAGNOSIS — R070 Pain in throat: Secondary | ICD-10-CM | POA: Insufficient documentation

## 2020-11-08 DIAGNOSIS — R110 Nausea: Secondary | ICD-10-CM | POA: Diagnosis not present

## 2020-11-08 DIAGNOSIS — J45909 Unspecified asthma, uncomplicated: Secondary | ICD-10-CM | POA: Diagnosis not present

## 2020-11-08 DIAGNOSIS — Z79899 Other long term (current) drug therapy: Secondary | ICD-10-CM | POA: Diagnosis not present

## 2020-11-08 DIAGNOSIS — R0602 Shortness of breath: Secondary | ICD-10-CM | POA: Diagnosis not present

## 2020-11-08 DIAGNOSIS — R0789 Other chest pain: Secondary | ICD-10-CM

## 2020-11-08 DIAGNOSIS — R079 Chest pain, unspecified: Secondary | ICD-10-CM

## 2020-11-08 DIAGNOSIS — I10 Essential (primary) hypertension: Secondary | ICD-10-CM | POA: Diagnosis not present

## 2020-11-08 DIAGNOSIS — R053 Chronic cough: Secondary | ICD-10-CM | POA: Insufficient documentation

## 2020-11-08 DIAGNOSIS — R197 Diarrhea, unspecified: Secondary | ICD-10-CM | POA: Insufficient documentation

## 2020-11-08 DIAGNOSIS — Z20822 Contact with and (suspected) exposure to covid-19: Secondary | ICD-10-CM | POA: Insufficient documentation

## 2020-11-08 DIAGNOSIS — R002 Palpitations: Secondary | ICD-10-CM | POA: Insufficient documentation

## 2020-11-08 DIAGNOSIS — F17290 Nicotine dependence, other tobacco product, uncomplicated: Secondary | ICD-10-CM | POA: Insufficient documentation

## 2020-11-08 DIAGNOSIS — Z7951 Long term (current) use of inhaled steroids: Secondary | ICD-10-CM | POA: Diagnosis not present

## 2020-11-08 LAB — D-DIMER, QUANTITATIVE: D-Dimer, Quant: 0.34 ug/mL-FEU (ref 0.00–0.50)

## 2020-11-08 LAB — BASIC METABOLIC PANEL
Anion gap: 8 (ref 5–15)
BUN: 9 mg/dL (ref 6–20)
CO2: 22 mmol/L (ref 22–32)
Calcium: 9 mg/dL (ref 8.9–10.3)
Chloride: 109 mmol/L (ref 98–111)
Creatinine, Ser: 0.81 mg/dL (ref 0.44–1.00)
GFR, Estimated: >60 mL/min (ref >60)
Glucose, Bld: 90 mg/dL (ref 70–99)
Potassium: 3.4 mmol/L — ABNORMAL LOW (ref 3.5–5.1)
Sodium: 139 mmol/L (ref 135–145)

## 2020-11-08 LAB — RESP PANEL BY RT-PCR (FLU A&B, COVID) ARPGX2
Influenza A by PCR: NEGATIVE
Influenza B by PCR: NEGATIVE
SARS Coronavirus 2 by RT PCR: NEGATIVE

## 2020-11-08 LAB — CBC
HCT: 39.9 % (ref 36.0–46.0)
Hemoglobin: 13.2 g/dL (ref 12.0–15.0)
MCH: 31.4 pg (ref 26.0–34.0)
MCHC: 33.1 g/dL (ref 30.0–36.0)
MCV: 94.8 fL (ref 80.0–100.0)
Platelets: 228 10*3/uL (ref 150–400)
RBC: 4.21 MIL/uL (ref 3.87–5.11)
RDW: 13.1 % (ref 11.5–15.5)
WBC: 6 10*3/uL (ref 4.0–10.5)
nRBC: 0 % (ref 0.0–0.2)

## 2020-11-08 LAB — TROPONIN I (HIGH SENSITIVITY): Troponin I (High Sensitivity): <2 ng/L (ref <18)

## 2020-11-08 MED ORDER — KETOROLAC TROMETHAMINE 30 MG/ML IJ SOLN: 30.0000 mg | Freq: Once | INTRAMUSCULAR | Status: DC

## 2020-11-08 NOTE — ED Provider Notes (Signed)
[facility] EMERGENCY DEPARTMENT Provider Note   CSN: [id number] Arrival date & time: 11/08/20  1543     History Chief Complaint  Patient presents with  . Chest Pain    Barbara Lam is a 29 y.o. female presents with right-sided chest pain x 3 days, palpitations, as well as worsening shortness of breath.  Patient has poorly controlled asthma at baseline, chronic productive cough with yellow sputum, which she says is unchanged. However shortness of breath is worsened from baseline.  Additionally she endorses mild sore throat, nausea, diarrhea x 2 days.   She denies fevers or chills at home, denies abdominal pain, vomiting.  Denies loss of taste or smell. Patient is not vaccinated against COVID-19.  Patient denies history of blood clot in the past.  Denies recent surgical procedure, travel, prolonged immobilization.  She is on supplemental progesterone for treatment of endometriosis.  Patient denies history of malignancy.  Endorses family history of PE.  I personally reviewed this patient's medical record.  She is history of poorly controlled asthma, chronic headaches, hypertension. She is on daily asthma inhalers.  HPI  HPI: A 29 year old patient with a history of obesity presents for evaluation of chest pain. Initial onset of pain was less than one hour ago. The patient's chest pain is not worse with exertion. The patient complains of nausea. The patient's chest pain is not middle- or left-sided, is not well-localized, is not described as heaviness/pressure/tightness, is not sharp and does not radiate to the arms/jaw/neck. The patient denies diaphoresis. The patient has no history of stroke, has no history of peripheral artery disease, has not smoked in the past 90 days, denies any history of treated diabetes, has no relevant family history of coronary artery disease (first degree relative at less than age 48), is not hypertensive and has no history of hypercholesterolemia.   Past  Medical History:  Diagnosis Date  . Asthma   . Chronic headaches   . Hypertension     There are no problems to display for this patient.   Past Surgical History:  Procedure Laterality Date  . EYE SURGERY       OB History    Gravida  0   Para  0   Term  0   Preterm      AB      Living        SAB      TAB      Ectopic      Multiple      Live Births              Family History  Problem Relation Age of Onset  . Diabetes Other   . Asthma Mother   . Healthy Father     Social History   Tobacco Use  . Smoking status: Current Some Day Smoker    Years: 3.00    Types: Cigars  . Smokeless tobacco: Never Used  Vaping Use  . Vaping Use: Never used  Substance Use Topics  . Alcohol use: Yes    Comment: Socially   . Drug use: No    Home Medications Prior to Admission medications   Medication Sig Start Date End Date Taking? Authorizing Provider  albuterol (VENTOLIN HFA) 108 (90 Base) MCG/ACT inhaler Inhale 1-2 puffs into the lungs every 6 (six) hours as needed for wheezing or shortness of breath. 04/04/19  Yes Wieters, Hallie C, PA-C  ALLERGY RELIEF 10 MG tablet Take 10 mg by mouth daily. 01/11/20  Yes [provider]  amLODipine (NORVASC) 5 MG tablet Take 5 mg by mouth daily.   Yes [provider]  cetirizine (ZYRTEC ALLERGY) 10 MG tablet Take 1 tablet (10 mg total) by mouth daily. 05/17/20  Yes Avegno, Darrelyn Hillock, FNP  Elagolix Sodium (ORILISSA) 200 MG TABS Take 1 tablet by mouth 2 (two) times daily. 07/26/20  Yes Florian Buff, MD  fluticasone (FLONASE) 50 MCG/ACT nasal spray Place 1 spray into both nostrils daily for 14 days. 05/17/20 11/08/20 Yes Avegno, Darrelyn Hillock, FNP  ketorolac (TORADOL) 10 MG tablet Take 1 tablet (10 mg total) by mouth every 8 (eight) hours as needed. 10/03/20  Yes Florian Buff, MD  megestrol (MEGACE) 40 MG tablet 3 tablets a day for 5 days, 2 tablets a day for 5 days then 1 tablet daily 10/03/20  Yes Florian Buff, MD   montelukast (SINGULAIR) 10 MG tablet Take 10 mg by mouth daily. 01/11/20  Yes [provider]  benzonatate (TESSALON) 100 MG capsule Take 1 capsule (100 mg total) by mouth every 8 (eight) hours. Patient not taking: Reported on 07/26/2020 05/17/20   Emerson Monte, FNP  COVID-19 Specimen Collection KIT See admin instructions. for testing Patient not taking: Reported on 07/26/2020 02/23/20   [provider]  ibuprofen (ADVIL) 800 MG tablet Take 1 tablet (800 mg total) by mouth every 8 (eight) hours as needed for moderate pain. Patient not taking: Reported on 11/08/2020 02/05/20   Faustino Congress, NP  medroxyPROGESTERone Acetate (DEPO-PROVERA IM) Inject into the muscle. Patient not taking: Reported on 11/08/2020    [provider]  predniSONE (DELTASONE) 10 MG tablet Take 2 tablets (20 mg total) by mouth daily. Patient not taking: Reported on 07/26/2020 05/17/20   Emerson Monte, FNP  triamcinolone cream (KENALOG) 0.1 % Apply 1 application topically 2 (two) times daily. Patient not taking: Reported on 07/26/2020 02/02/19   Rosemarie Ax, MD    Allergies    Lisinopril and Tomato  Review of Systems   Review of Systems  Constitutional: Positive for activity change, appetite change and fatigue. Negative for chills, diaphoresis and fever.  HENT: Positive for sore throat. Negative for congestion, ear pain, facial swelling, hearing loss, mouth sores, nosebleeds, rhinorrhea, sinus pressure, sinus pain, trouble swallowing and voice change.   Cardiovascular: Positive for chest pain and palpitations. Negative for leg swelling.  Gastrointestinal: Positive for nausea. Negative for abdominal pain, diarrhea and vomiting.  Genitourinary: Negative.   Musculoskeletal: Negative.   Skin: Negative.   Allergic/Immunologic: Negative.   Neurological: Negative.   Psychiatric/Behavioral: Negative.     Physical Exam Updated Vital Signs BP 122/83 (BP Location: Left Arm)   Pulse 98    Temp 97.9 F (36.6 C) (Oral)   Resp 20   Ht '5\' 6"'  (1.676 m)   Wt 88.9 kg   SpO2 100%   BMI 31.64 kg/m   Physical Exam Vitals and nursing note reviewed.  HENT:     Head: Normocephalic and atraumatic.     Nose: Nose normal.     Mouth/Throat:     Mouth: Mucous membranes are moist.     Pharynx: Oropharynx is clear. Uvula midline. No oropharyngeal exudate or posterior oropharyngeal erythema.     Tonsils: No tonsillar exudate.  Eyes:     General:        Right eye: No discharge.        Left eye: No discharge.     Extraocular Movements: Extraocular movements intact.  Conjunctiva/sclera: Conjunctivae normal.     Pupils: Pupils are equal, round, and reactive to light.  Neck:     Trachea: Trachea and phonation normal.  Cardiovascular:     Rate and Rhythm: Normal rate and regular rhythm.     Pulses: Normal pulses.          Radial pulses are 2+ on the right side and 2+ on the left side.       Dorsalis pedis pulses are 2+ on the right side and 2+ on the left side.     Heart sounds: Normal heart sounds. No murmur heard.   Pulmonary:     Effort: Pulmonary effort is normal. No respiratory distress.     Breath sounds: Normal breath sounds. No wheezing or rales.  Chest:     Chest wall: No mass, tenderness, crepitus or edema.  Abdominal:     General: Bowel sounds are normal. There is no distension.     Palpations: Abdomen is soft. There is no mass.     Tenderness: There is no abdominal tenderness.  Musculoskeletal:        General: No deformity.     Cervical back: Normal range of motion and neck supple. No rigidity or crepitus. No pain with movement, spinous process tenderness or muscular tenderness.     Right lower leg: No tenderness. No edema.     Left lower leg: No tenderness. No edema.  Skin:    General: Skin is warm and dry.  Neurological:     General: No focal deficit present.     Mental Status: She is alert and oriented to person, place, and time. Mental status is at  baseline.     Gait: Gait is intact.  Psychiatric:        Mood and Affect: Mood normal.     ED Results / Procedures / Treatments   Labs (all labs ordered are listed, but only abnormal results are displayed) Labs Reviewed  BASIC METABOLIC PANEL - Abnormal; Notable for the following components:      Result Value   Potassium 3.4 (*)    All other components within normal limits  RESP PANEL BY RT-PCR (FLU A&B, COVID) ARPGX2  CBC  D-DIMER, QUANTITATIVE (NOT AT [facility])  POC URINE PREG, ED  TROPONIN I (HIGH SENSITIVITY)    EKG EKG Interpretation  Date/Time:  Tuesday November 08 2020 15:52:22 EST Ventricular Rate:  111 PR Interval:  148 QRS Duration: 68 QT Interval:  298 QTC Calculation: 405 R Axis:   93 Text Interpretation: Sinus tachycardia Rightward axis Borderline ECG Confirmed by Deno Etienne [phone]) on 11/08/2020 4:11:46 PM   Radiology DG Chest 2 View  Result Date: 11/08/2020 CLINICAL DATA:  Chest pain, cough EXAM: CHEST - 2 VIEW COMPARISON:  05/06/2020 FINDINGS: The heart size and mediastinal contours are within normal limits. Both lungs are clear. No pleural effusion. The visualized skeletal structures are unremarkable. IMPRESSION: No acute process in the chest. Electronically Signed   By: Macy Mis M.D.   On: 11/08/2020 16:48    Procedures Procedures (including critical care time)  Medications Ordered in ED Medications  ketorolac (TORADOL) 30 MG/ML injection 30 mg (30 mg Intramuscular Not Given 11/08/20 1917)    ED Course  I have reviewed the triage vital signs and the nursing notes.  Pertinent labs & imaging results that were available during my care of the patient were reviewed by me and considered in my medical decision making (see chart for details).  MDM Rules/Calculators/A&P HEAR Score: 1                       29 year old female who presents with 3 days of shortness of breath, right-sided chest pain, palpitations, diarrhea.  She is not vaccinated  against COVID-19.  Differential diagnosis for this patient includes but is not limited to acute viral illness, pneumonia, PE, musculoskeletal pain, ACS, arrhythmia.   Patient tachycardic to 107 on intake, O2 saturation 98% on room air. Patient is afebrile.  Physical exam is reassuring.  Cardiopulmonary exam is normal, patient borderline tachycardic with heart rate in the 90s at time of my exam.  CBC unremarkable, BMP with mild hypokalemia 3.4.  Initial troponin negative, < 2  Respiratory pathogen panel negative for COVID-19, influenza A/B.  Chest x-ray without acute process in the chest.  EKG with sinus rhythm, rightward axis.   PERC positive, will proceed with d-dimer.   D-dimer is negative, 0.34.  HEART score of 1  The time of my reevaluation the patient she is sitting in bed, tearful, states that she just wants to go home. Informed her of her test results, and that I feel it is appropriate plan.  Given reassuring physical exam, vital signs, imaging studies, laboratory studies, and EKG, no further work-up is warranted in the emergency department at this time. Unclear etiology of this patient's symptoms, however there does not appear to be any emergent cause at this time. Recommend close follow-up with your PCP.  Alyla voiced understanding of her medical evaluation treatment plan. Each of her questions were answered to her expressed satisfaction. Return precautions were given. Patient is well-appearing and is stable for discharge.  Final Clinical Impression(s) / ED Diagnoses Final diagnoses:  Atypical chest pain    Rx / DC Orders ED Discharge Orders    None       Emeline Darling, PA-C 11/08/20 [place], [place], DO 11/09/20 [phone]

## 2020-11-08 NOTE — ED Notes (Signed)
Attempted to give patient pain meds before she left, but she became frustrated that her discharge papers weren't ready right this second. Pt stated that she wasn't waiting any more and walked out. Provider made aware.  Due to patient leaving, I was unable to get her final set of vitals or get her signature for discharge.

## 2020-11-08 NOTE — ED Notes (Signed)
Upon entering room patient had blankets pulled over her head. Had to awaken patient to let her know I needed to update her vitas, patient had taken off pulse ox and blood pressure cuff. Reminded patient that urine specimen was needed. Patient states " I havent had any food, drink or pain medicine to be able to void. Patient states she asked for pain medicine 3 hours ago. Patient states that if she does not get any she is going to leave. Will make RN aware.

## 2020-11-08 NOTE — Discharge Instructions (Signed)
You were evaluated in the emergency department for your chest pain and shortness of breath.  Physical exam, vital signs, blood work, chest x-ray, and EKG are very reassuring.  There is no sign of heart attack, damage to heart, or blood clot in your lungs.  This is very good news!  Additionally you tested negative for COVID-19, influenza A/B.  While exact cause for your chest pain remains unclear, there is no emergent cause for your symptoms at this time.  You may follow-up with your primary care doctor.  Please return to the emergency department if you develop any worsening chest pain, difficulty breathing, palpitations, nausea or vomiting that does not stop, or other new severe symptoms.

## 2020-11-08 NOTE — ED Triage Notes (Signed)
Pt with generalized chest pain with cough, productive and yellow in color. No fevers.

## 2020-11-08 NOTE — ED Notes (Signed)
I assumed care of patient at this time and attempted to introduce self to patient. However, pt was sleeping with lighting dimmed and sheets pulled over her head. Will introduce self upon pt awakening. Pt appears to be in NAD at this time. Respirations even and unlabored.

## 2020-11-15 ENCOUNTER — Ambulatory Visit: Payer: Managed Care, Other (non HMO) | Admitting: Obstetrics & Gynecology

## 2020-11-21 ENCOUNTER — Ambulatory Visit: Payer: Managed Care, Other (non HMO) | Admitting: Obstetrics & Gynecology

## 2020-12-12 ENCOUNTER — Ambulatory Visit (INDEPENDENT_AMBULATORY_CARE_PROVIDER_SITE_OTHER): Payer: Managed Care, Other (non HMO) | Admitting: Obstetrics & Gynecology

## 2020-12-12 ENCOUNTER — Other Ambulatory Visit: Payer: Self-pay

## 2020-12-12 ENCOUNTER — Encounter: Payer: Self-pay | Admitting: Obstetrics & Gynecology

## 2020-12-12 VITALS — BP 133/91 | HR 97 | Ht 61.0 in | Wt 185.5 lb

## 2020-12-12 DIAGNOSIS — N921 Excessive and frequent menstruation with irregular cycle: Secondary | ICD-10-CM | POA: Diagnosis not present

## 2020-12-12 DIAGNOSIS — N809 Endometriosis, unspecified: Secondary | ICD-10-CM

## 2020-12-12 DIAGNOSIS — N941 Unspecified dyspareunia: Secondary | ICD-10-CM | POA: Diagnosis not present

## 2020-12-12 DIAGNOSIS — N946 Dysmenorrhea, unspecified: Secondary | ICD-10-CM | POA: Diagnosis not present

## 2020-12-12 NOTE — Progress Notes (Signed)
Chief Complaint  Patient presents with  . Follow-up      29 y.o. G0P0000 No LMP recorded. (Menstrual status: Irregular Periods). The current method of family planning is [place] .  Outpatient Encounter Medications as of 12/12/2020  Medication Sig  . albuterol (VENTOLIN HFA) 108 (90 Base) MCG/ACT inhaler Inhale 1-2 puffs into the lungs every 6 (six) hours as needed for wheezing or shortness of breath.  . ALLERGY RELIEF 10 MG tablet Take 10 mg by mouth daily.  Marland Kitchen amLODipine (NORVASC) 5 MG tablet Take 5 mg by mouth daily.  . benzonatate (TESSALON) 100 MG capsule Take 1 capsule (100 mg total) by mouth every 8 (eight) hours.  . cetirizine (ZYRTEC ALLERGY) 10 MG tablet Take 1 tablet (10 mg total) by mouth daily.  . Elagolix Sodium (ORILISSA) 200 MG TABS Take 1 tablet by mouth 2 (two) times daily.  Marland Kitchen ibuprofen (ADVIL) 800 MG tablet Take 1 tablet (800 mg total) by mouth every 8 (eight) hours as needed for moderate pain.  Marland Kitchen ketorolac (TORADOL) 10 MG tablet Take 1 tablet (10 mg total) by mouth every 8 (eight) hours as needed.  . megestrol (MEGACE) 40 MG tablet 3 tablets a day for 5 days, 2 tablets a day for 5 days then 1 tablet daily  . montelukast (SINGULAIR) 10 MG tablet Take 10 mg by mouth daily.  . fluticasone (FLONASE) 50 MCG/ACT nasal spray Place 1 spray into both nostrils daily for 14 days.  . [DISCONTINUED] COVID-19 Specimen Collection KIT See admin instructions. for testing (Patient not taking: Reported on 07/26/2020)  . [DISCONTINUED] medroxyPROGESTERone Acetate (DEPO-PROVERA IM) Inject into the muscle. (Patient not taking: Reported on 11/08/2020)  . [DISCONTINUED] predniSONE (DELTASONE) 10 MG tablet Take 2 tablets (20 mg total) by mouth daily. (Patient not taking: No sig reported)  . [DISCONTINUED] triamcinolone cream (KENALOG) 0.1 % Apply 1 application topically 2 (two) times daily. (Patient not taking: Reported on 07/26/2020)   No facility-administered encounter medications on  file as of 12/12/2020.    Subjective Pt has had virtually no response to the orilissa, has continued to bleed with great deal of pain continuing No other conservative options are left Past Medical History:  Diagnosis Date  . Asthma   . Chronic headaches   . Hypertension     Past Surgical History:  Procedure Laterality Date  . EYE SURGERY      OB History    Gravida  0   Para  0   Term  0   Preterm      AB      Living        SAB      IAB      Ectopic      Multiple      Live Births              Allergies  Allergen Reactions  . Lisinopril   . Tomato Hives    Social History   Socioeconomic History  . Marital status: Single    Spouse name: Not on file  . Number of children: Not on file  . Years of education: Not on file  . Highest education level: Not on file  Occupational History  . Not on file  Tobacco Use  . Smoking status: Current Some Day Smoker    Years: 3.00    Types: Cigars  . Smokeless tobacco: Never Used  Vaping Use  . Vaping Use: Never used  Substance and Sexual Activity  .  Alcohol use: Yes    Comment: Socially   . Drug use: No  . Sexual activity: Not Currently    Birth control/protection: None  Other Topics Concern  . Not on file  Social History Narrative  . Not on file   Social Determinants of Health   Financial Resource Strain: Not on file  Food Insecurity: Not on file  Transportation Needs: Not on file  Physical Activity: Not on file  Stress: Not on file  Social Connections: Not on file    Family History  Problem Relation Age of Onset  . Diabetes Other   . Asthma Mother   . Healthy Father     Medications:       Current Outpatient Medications:  .  albuterol (VENTOLIN HFA) 108 (90 Base) MCG/ACT inhaler, Inhale 1-2 puffs into the lungs every 6 (six) hours as needed for wheezing or shortness of breath., Disp: 1 Inhaler, Rfl: 0 .  ALLERGY RELIEF 10 MG tablet, Take 10 mg by mouth daily., Disp: , Rfl:  .   amLODipine (NORVASC) 5 MG tablet, Take 5 mg by mouth daily., Disp: , Rfl:  .  benzonatate (TESSALON) 100 MG capsule, Take 1 capsule (100 mg total) by mouth every 8 (eight) hours., Disp: 30 capsule, Rfl: 0 .  cetirizine (ZYRTEC ALLERGY) 10 MG tablet, Take 1 tablet (10 mg total) by mouth daily., Disp: 30 tablet, Rfl: 0 .  Elagolix Sodium (ORILISSA) 200 MG TABS, Take 1 tablet by mouth 2 (two) times daily., Disp: 60 tablet, Rfl: 5 .  ibuprofen (ADVIL) 800 MG tablet, Take 1 tablet (800 mg total) by mouth every 8 (eight) hours as needed for moderate pain., Disp: 21 tablet, Rfl: 0 .  ketorolac (TORADOL) 10 MG tablet, Take 1 tablet (10 mg total) by mouth every 8 (eight) hours as needed., Disp: 15 tablet, Rfl: 0 .  megestrol (MEGACE) 40 MG tablet, 3 tablets a day for 5 days, 2 tablets a day for 5 days then 1 tablet daily, Disp: 45 tablet, Rfl: 3 .  montelukast (SINGULAIR) 10 MG tablet, Take 10 mg by mouth daily., Disp: , Rfl:  .  fluticasone (FLONASE) 50 MCG/ACT nasal spray, Place 1 spray into both nostrils daily for 14 days., Disp: 16 g, Rfl: 0  Objective Blood pressure (!) 133/91, pulse 97, height _0  (1.549 m), weight 185 lb 8 oz (84.1 kg).  Gen WDWN NAD  Pertinent ROS   Labs or studies     Impression Diagnoses this Encounter::   ICD-10-CM   1. Endometriosis  N80.9   2. Menometrorrhagia  N92.1   3. Dysmenorrhea  N94.6   4. Dyspareunia, female  N94.10     Established relevant diagnosis(es):   Plan/Recommendations: No orders of the defined types were placed in this encounter.   Labs or Scans Ordered: No orders of the defined types were placed in this encounter.   Management:: No significant improvement clinical response to the orilissa + toradol  We have exhausted all reasnable conservative measures  Plan Total abdominal hysterectomy with bilateral salpingectomy with possible removal of both ovaries, depending on the intra operative findings, hopefully conserve the ovaries,  but patient is aware may be removed now or in the future if needed  Follow up Return in about 6 weeks (around 01/20/2021) for [place], with Dr Elonda Husky.        Face to face time:  15 minutes  Greater than 50% of the visit time was spent in counseling and coordination of care  with the patient.  The summary and outline of the counseling and care coordination is summarized in the note above.   All questions were answered.

## 2021-01-06 NOTE — Patient Instructions (Signed)
Barbara Lam  01/06/2021     @PREFPERIOPPHARMACY @   Your procedure is scheduled on   01/11/2021    Report to [facility] at  0700  A.M.    Call this number if you have problems the morning of surgery:  [phone]             You may have clear liquids until you drink your carb drink at            No solid food after midnight.                        Take these medicines the morning of surgery with A SIP OF WATER  Amlodipine, zyrtec, orilissa, toradol(if needed), singulair.    Please brush your teeth.  Do not wear jewelry, make-up or nail polish.  Do not wear lotions, powders, or perfumes, or deodorant.  Do not shave 48 hours prior to surgery.  Men may shave face and neck.  Do not bring valuables to the hospital.  [facility] is not responsible for any belongings or valuables.  Contacts, dentures or bridgework may not be worn into surgery.  Leave your suitcase in the car.  After surgery it may be brought to your room.  For patients admitted to the hospital, discharge time will be determined by your treatment team.  Patients discharged the day of surgery will not be allowed to drive home and they will need some one with them for 24 hours.   Special instructions:   DO NOT smoke(tobacco or vape) the morning of your procedure.  Shower with CHG the night before and the morning of your procedure. DO NOT use CHG on your face, hair or genitals (private parts).   Dry off with a clean towel and out clean clothes on to sleep in.  Put clean sheets on your bed before you go to sleep.  After your morning shower, brush your teeth and out on clean comfortable clothes on to wear to the hospital.    Please read over the following fact sheets that you were given. Anesthesia Post-op Instructions and Care and Recovery After Surgery       Abdominal Hysterectomy, Care After The following information offers guidance on how to care for yourself after your procedure. Your  doctor may also give you more specific instructions. If you have problems or questions, contact your doctor. What can I expect after the procedure? After the procedure, it is common to have:  Pain.  Tiredness.  No desire to eat.  Less interest in sex.  Bleeding and fluid (discharge) from your vagina. You may need to use a pad after this procedure.  Trouble pooping (constipation).  Feelings of sadness or other emotions. Follow these instructions at home: Medicines  Take over-the-counter and prescription medicines only as told by your doctor.  Do not take aspirin or NSAIDs, such as ibuprofen. These medicines can cause bleeding.  If you were prescribed an antibiotic medicine, take it as told by your doctor. Do not stop using the antibiotic even if you start to feel better.  If told, take steps to prevent problems with pooping (constipation). You may need to: ? Drink enough fluid to keep your pee (urine) pale yellow. ? Take medicines. You will be told what medicines to take. ? Eat foods that are high in fiber. These include beans, whole grains, and fresh fruits and vegetables. ? Limit foods that are  high in fat and sugar. These include fried or sweet foods.  Ask your doctor if you should avoid driving or using machines while you are taking your medicine. Surgical cut care  Follow instructions from your doctor about how to take care of your cut from surgery (incision). Make sure you: ? Wash your hands with soap and water for at least 20 seconds before and after you change your bandage. If you cannot use soap and water, use hand sanitizer. ? Change your bandage. ? Leave stitches or skin glue in place for at least two weeks. ? Leave tape strips alone unless you are told to take them off. You may trim the edges of the tape strips if they curl up.  Keep the bandage dry until your doctor says it can be taken off.  Check your incision every day for signs of infection. Check  for: ? More redness, swelling, or pain. ? Fluid or blood. ? Warmth. ? Pus or a bad smell.   Activity  Rest as told by your doctor.  Get up to take short walks every 1 to 2 hours. Ask for help if you feel weak or unsteady.  Do not lift anything that is heavier than 10 lb (4.5 kg), or the limit that you are told.  Follow your doctor's advice about exercise, driving, and general activities.  Return to your normal activities when your doctor says that it is safe.   Lifestyle  Do not douche, use tampons, or have sex for at least 6 weeks or as told by your doctor.  Do not drink alcohol until your doctor says it is okay.  Do not smoke or use any products that contain nicotine or tobacco. These can delay healing after surgery. If you need help quitting, ask your doctor. General instructions  Do not take baths, swim, or use a hot tub. Ask your doctor about taking showers or sponge baths.  Try to have a responsible adult at home with you for the first 1-2 weeks to help with your daily chores.  Wear tight-fitting (compression) stockings as told by your doctor.  Keep all follow-up visits. Contact a doctor if:  You have chills or a fever.  You have any of these signs of infection around your cut: ? More redness, swelling, or pain. ? Fluid or blood. ? Warmth. ? Pus or a bad smell.  Your cut breaks open.  You feel dizzy or light-headed.  You have pain or bleeding when you pee.  You keep having watery poop (diarrhea).  You keep feeling like you may vomit or you keep vomiting.  You have fluid coming from your vagina that is not normal.  You have any type of reaction to your medicine that is not normal, like a rash, or you develop an allergy to your medicine.  Your pain medicine does not help. Get help right away if:  You have a fever and your symptoms get worse suddenly.  You have very bad pain in your belly (abdomen).  You are short of breath.  You faint.  You have  pain, swelling, or redness of your leg.  You bleed a lot from your vagina and you see blood clots. Summary  It is normal to have some pain, tiredness, and fluid that comes from your vagina.  Do not take baths, swim, or use a hot tub. Ask your doctor about taking showers or sponge baths.  Do not lift anything that is heavier than 10 lb (4.5 kg),  or the limit that you are told.  Follow your doctor's advice about exercise, driving, and general activities.  Try to have a responsible adult at home with you for the first 1-2 weeks to help with your daily chores. This information is not intended to replace advice given to you by your health care provider. Make sure you discuss any questions you have with your health care provider. Document Revised: 08/04/2020 Document Reviewed: 08/04/2020 Elsevier Patient Education  2021 [place] Anesthesia, Adult, Care After This sheet gives you information about how to care for yourself after your procedure. Your health care provider may also give you more specific instructions. If you have problems or questions, contact your health care provider. What can I expect after the procedure? After the procedure, the following side effects are common:  Pain or discomfort at the IV site.  Nausea.  Vomiting.  Sore throat.  Trouble concentrating.  Feeling cold or chills.  Feeling weak or tired.  Sleepiness and fatigue.  Soreness and body aches. These side effects can affect parts of the body that were not involved in surgery. Follow these instructions at home: For the time period you were told by your health care provider:  Rest.  Do not participate in activities where you could fall or become injured.  Do not drive or use machinery.  Do not drink alcohol.  Do not take sleeping pills or medicines that cause drowsiness.  Do not make important decisions or sign legal documents.  Do not take care of children on your own.   Eating  and drinking  Follow any instructions from your health care provider about eating or drinking restrictions.  When you feel hungry, start by eating small amounts of foods that are soft and easy to digest (bland), such as toast. Gradually return to your regular diet.  Drink enough fluid to keep your urine pale yellow.  If you vomit, rehydrate by drinking water, juice, or clear broth. General instructions  If you have sleep apnea, surgery and certain medicines can increase your risk for breathing problems. Follow instructions from your health care provider about wearing your sleep device: ? Anytime you are sleeping, including during daytime naps. ? While taking prescription pain medicines, sleeping medicines, or medicines that make you drowsy.  Have a responsible adult stay with you for the time you are told. It is important to have someone help care for you until you are awake and alert.  Return to your normal activities as told by your health care provider. Ask your health care provider what activities are safe for you.  Take over-the-counter and prescription medicines only as told by your health care provider.  If you smoke, do not smoke without supervision.  Keep all follow-up visits as told by your health care provider. This is important. Contact a health care provider if:  You have nausea or vomiting that does not get better with medicine.  You cannot eat or drink without vomiting.  You have pain that does not get better with medicine.  You are unable to pass urine.  You develop a skin rash.  You have a fever.  You have redness around your IV site that gets worse. Get help right away if:  You have difficulty breathing.  You have chest pain.  You have blood in your urine or stool, or you vomit blood. Summary  After the procedure, it is common to have a sore throat or nausea. It is also common to feel tired.  Have a responsible adult stay with you for the time you  are told. It is important to have someone help care for you until you are awake and alert.  When you feel hungry, start by eating small amounts of foods that are soft and easy to digest (bland), such as toast. Gradually return to your regular diet.  Drink enough fluid to keep your urine pale yellow.  Return to your normal activities as told by your health care provider. Ask your health care provider what activities are safe for you. This information is not intended to replace advice given to you by your health care provider. Make sure you discuss any questions you have with your health care provider. Document Revised: 08/18/2020 Document Reviewed: 03/17/2020 Elsevier Patient Education  2021 [employer].

## 2021-01-08 ENCOUNTER — Other Ambulatory Visit: Payer: Self-pay | Admitting: Obstetrics & Gynecology

## 2021-01-09 ENCOUNTER — Encounter (HOSPITAL_COMMUNITY)
Admission: RE | Admit: 2021-01-09 | Discharge: 2021-01-09 | Disposition: A | Discharge status: Home / Self Care | Payer: Managed Care, Other (non HMO) | Source: Ambulatory Visit | Attending: Obstetrics & Gynecology | Admitting: Obstetrics & Gynecology

## 2021-01-09 ENCOUNTER — Other Ambulatory Visit: Payer: Self-pay

## 2021-01-09 ENCOUNTER — Other Ambulatory Visit (HOSPITAL_COMMUNITY)
Admission: RE | Admit: 2021-01-09 | Discharge: 2021-01-09 | Disposition: A | Discharge status: Home / Self Care | Payer: Managed Care, Other (non HMO) | Source: Ambulatory Visit | Attending: Obstetrics & Gynecology | Admitting: Obstetrics & Gynecology

## 2021-01-09 ENCOUNTER — Encounter (HOSPITAL_COMMUNITY): Payer: Self-pay

## 2021-01-09 DIAGNOSIS — Z20822 Contact with and (suspected) exposure to covid-19: Secondary | ICD-10-CM | POA: Diagnosis not present

## 2021-01-09 DIAGNOSIS — Z01818 Encounter for other preprocedural examination: Secondary | ICD-10-CM | POA: Diagnosis present

## 2021-01-09 LAB — RAPID HIV SCREEN (HIV 1/2 AB+AG)
HIV 1/2 Antibodies: NONREACTIVE
HIV-1 P24 Antigen - HIV24: NONREACTIVE

## 2021-01-09 LAB — CBC
HCT: 40.8 % (ref 36.0–46.0)
Hemoglobin: 13.4 g/dL (ref 12.0–15.0)
MCH: 31.5 pg (ref 26.0–34.0)
MCHC: 32.8 g/dL (ref 30.0–36.0)
MCV: 95.8 fL (ref 80.0–100.0)
Platelets: 233 10*3/uL (ref 150–400)
RBC: 4.26 MIL/uL (ref 3.87–5.11)
RDW: 13.3 % (ref 11.5–15.5)
WBC: 6 10*3/uL (ref 4.0–10.5)
nRBC: 0 % (ref 0.0–0.2)

## 2021-01-09 LAB — COMPREHENSIVE METABOLIC PANEL
ALT: 16 U/L (ref 0–44)
AST: 22 U/L (ref 15–41)
Albumin: 3.6 g/dL (ref 3.5–5.0)
Alkaline Phosphatase: 79 U/L (ref 38–126)
Anion gap: 7 (ref 5–15)
BUN: 11 mg/dL (ref 6–20)
CO2: 26 mmol/L (ref 22–32)
Calcium: 9 mg/dL (ref 8.9–10.3)
Chloride: 107 mmol/L (ref 98–111)
Creatinine, Ser: 0.99 mg/dL (ref 0.44–1.00)
GFR, Estimated: >60 mL/min (ref >60)
Glucose, Bld: 101 mg/dL — ABNORMAL HIGH (ref 70–99)
Potassium: 3.6 mmol/L (ref 3.5–5.1)
Sodium: 140 mmol/L (ref 135–145)
Total Bilirubin: 0.6 mg/dL (ref 0.3–1.2)
Total Protein: 7.4 g/dL (ref 6.5–8.1)

## 2021-01-09 LAB — URINALYSIS, ROUTINE W REFLEX MICROSCOPIC
Bilirubin Urine: NEGATIVE
Glucose, UA: NEGATIVE mg/dL
Ketones, ur: NEGATIVE mg/dL
Nitrite: NEGATIVE
Protein, ur: NEGATIVE mg/dL
Specific Gravity, Urine: 1.023 (ref 1.005–1.030)
pH: 5 (ref 5.0–8.0)

## 2021-01-09 LAB — TYPE AND SCREEN
ABO/RH(D): B NEG
Antibody Screen: NEGATIVE

## 2021-01-09 LAB — HCG, QUANTITATIVE, PREGNANCY: hCG, Beta Chain, Quant, S: <1 m[IU]/mL (ref <5)

## 2021-01-10 LAB — SARS CORONAVIRUS 2 (TAT 6-24 HRS): SARS Coronavirus 2: NEGATIVE

## 2021-01-10 NOTE — H&P (Signed)
Preoperative History and Physical  Barbara Lam is a 30 y.o. G0P0000 with No LMP recorded. (Menstrual status: Irregular Periods). admitted for a TAH possible BSO.  Patient has really lifelong dysmenorrhea, menorrhagia Developed dyspareunia No respose to OCP or depo provera No response to megestrol No response to [place] + toradol  For some time she has wanted definitive surgical therapy, she does not want children and has had only same sex partners  PMH:    Past Medical History:  Diagnosis Date  . Asthma   . Chronic headaches   . Hypertension     PSH:     Past Surgical History:  Procedure Laterality Date  . EYE SURGERY      POb/GynH:      OB History    Gravida  0   Para  0   Term  0   Preterm      AB      Living        SAB      IAB      Ectopic      Multiple      Live Births              SH:   Social History   Tobacco Use  . Smoking status: Current Some Day Smoker    Years: 3.00    Types: Cigars  . Smokeless tobacco: Never Used  Vaping Use  . Vaping Use: Never used  Substance Use Topics  . Alcohol use: Yes    Comment: Socially   . Drug use: No    FH:    Family History  Problem Relation Age of Onset  . Diabetes Other   . Asthma Mother   . Healthy Father      Allergies:  Allergies  Allergen Reactions  . Lisinopril   . Tomato Hives    Medications:      No current facility-administered medications for this encounter.  Current Outpatient Medications:  .  albuterol (VENTOLIN HFA) 108 (90 Base) MCG/ACT inhaler, Inhale 1-2 puffs into the lungs every 6 (six) hours as needed for wheezing or shortness of breath., Disp: 1 Inhaler, Rfl: 0 .  ALLERGY RELIEF 10 MG tablet, Take 10 mg by mouth daily., Disp: , Rfl:  .  amLODipine (NORVASC) 5 MG tablet, Take 5 mg by mouth daily., Disp: , Rfl:  .  benzonatate (TESSALON) 100 MG capsule, Take 1 capsule (100 mg total) by mouth every 8 (eight) hours., Disp: 30 capsule, Rfl: 0 .  cetirizine  (ZYRTEC ALLERGY) 10 MG tablet, Take 1 tablet (10 mg total) by mouth daily., Disp: 30 tablet, Rfl: 0 .  Elagolix Sodium (ORILISSA) 200 MG TABS, Take 1 tablet by mouth 2 (two) times daily., Disp: 60 tablet, Rfl: 5 .  fluticasone (FLONASE) 50 MCG/ACT nasal spray, Place 1 spray into both nostrils daily for 14 days., Disp: 16 g, Rfl: 0 .  ibuprofen (ADVIL) 800 MG tablet, Take 1 tablet (800 mg total) by mouth every 8 (eight) hours as needed for moderate pain., Disp: 21 tablet, Rfl: 0 .  ketorolac (TORADOL) 10 MG tablet, Take 1 tablet (10 mg total) by mouth every 8 (eight) hours as needed., Disp: 15 tablet, Rfl: 0 .  megestrol (MEGACE) 40 MG tablet, 3 tablets a day for 5 days, 2 tablets a day for 5 days then 1 tablet daily, Disp: 45 tablet, Rfl: 3 .  montelukast (SINGULAIR) 10 MG tablet, Take 10 mg by mouth daily., Disp: , Rfl:  Review of Systems:   Review of Systems  Constitutional: Negative for fever, chills, weight loss, malaise/fatigue and diaphoresis.  HENT: Negative for hearing loss, ear pain, nosebleeds, congestion, sore throat, neck pain, tinnitus and ear discharge.   Eyes: Negative for blurred vision, double vision, photophobia, pain, discharge and redness.  Respiratory: Negative for cough, hemoptysis, sputum production, shortness of breath, wheezing and stridor.   Cardiovascular: Negative for chest pain, palpitations, orthopnea, claudication, leg swelling and PND.  Gastrointestinal: Positive for abdominal pain. Negative for heartburn, nausea, vomiting, diarrhea, constipation, blood in stool and melena.  Genitourinary: Negative for dysuria, urgency, frequency, hematuria and flank pain.  Musculoskeletal: Negative for myalgias, back pain, joint pain and falls.  Skin: Negative for itching and rash.  Neurological: Negative for dizziness, tingling, tremors, sensory change, speech change, focal weakness, seizures, loss of consciousness, weakness and headaches.  Endo/Heme/Allergies: Negative for  environmental allergies and polydipsia. Does not bruise/bleed easily.  Psychiatric/Behavioral: Negative for depression, suicidal ideas, hallucinations, memory loss and substance abuse. The patient is not nervous/anxious and does not have insomnia.      PHYSICAL EXAM:  There were no vitals taken for this visit.    Vitals reviewed. Constitutional: She is oriented to person, place, and time. She appears well-developed and well-nourished.  HENT:  Head: Normocephalic and atraumatic.  Right Ear: External ear normal.  Left Ear: External ear normal.  Nose: Nose normal.  Mouth/Throat: Oropharynx is clear and moist.  Eyes: Conjunctivae and EOM are normal. Pupils are equal, round, and reactive to light. Right eye exhibits no discharge. Left eye exhibits no discharge. No scleral icterus.  Neck: Normal range of motion. Neck supple. No tracheal deviation present. No thyromegaly present.  Cardiovascular: Normal rate, regular rhythm, normal heart sounds and intact distal pulses.  Exam reveals no gallop and no friction rub.   No murmur heard. Respiratory: Effort normal and breath sounds normal. No respiratory distress. She has no wheezes. She has no rales. She exhibits no tenderness.  GI: Soft. Bowel sounds are normal. She exhibits no distension and no mass. There is tenderness. There is no rebound and no guarding.  Genitourinary:       Vulva is normal without lesions Vagina is pink moist without discharge Cervix normal in appearance and pap is normal Uterus is normal size, contour, position, consistency, mobility, non-tender Adnexa is negative with normal sized ovaries by sonogram  Musculoskeletal: Normal range of motion. She exhibits no edema and no tenderness.  Neurological: She is alert and oriented to person, place, and time. She has normal reflexes. She displays normal reflexes. No cranial nerve deficit. She exhibits normal muscle tone. Coordination normal.  Skin: Skin is warm and dry. No rash  noted. No erythema. No pallor.  Psychiatric: She has a normal mood and affect. Her behavior is normal. Judgment and thought content normal.    Labs: Results for orders placed or performed during the hospital encounter of 01/09/21 (from the past 336 hour(s))  SARS CORONAVIRUS 2 (TAT 6-24 HRS) Nasopharyngeal Nasopharyngeal Swab   Collection Time: 01/09/21  1:18 PM   Specimen: Nasopharyngeal Swab  Result Value Ref Range   SARS Coronavirus 2 NEGATIVE NEGATIVE  CBC   Collection Time: 01/09/21  1:19 PM  Result Value Ref Range   WBC 6.0 4.0 - 10.5 K/uL   RBC 4.26 3.87 - 5.11 MIL/uL   Hemoglobin 13.4 12.0 - 15.0 g/dL   HCT 40.8 36.0 - 46.0 %   MCV 95.8 80.0 - 100.0 fL   MCH 31.5  26.0 - 34.0 pg   MCHC 32.8 30.0 - 36.0 g/dL   RDW 13.3 11.5 - 15.5 %   Platelets 233 150 - 400 K/uL   nRBC 0.0 0.0 - 0.2 %  Comprehensive metabolic panel   Collection Time: 01/09/21  1:19 PM  Result Value Ref Range   Sodium 140 135 - 145 mmol/L   Potassium 3.6 3.5 - 5.1 mmol/L   Chloride 107 98 - 111 mmol/L   CO2 26 22 - 32 mmol/L   Glucose, Bld 101 (H) 70 - 99 mg/dL   BUN 11 6 - 20 mg/dL   Creatinine, Ser 0.99 0.44 - 1.00 mg/dL   Calcium 9.0 8.9 - 10.3 mg/dL   Total Protein 7.4 6.5 - 8.1 g/dL   Albumin 3.6 3.5 - 5.0 g/dL   AST 22 15 - 41 U/L   ALT 16 0 - 44 U/L   Alkaline Phosphatase 79 38 - 126 U/L   Total Bilirubin 0.6 0.3 - 1.2 mg/dL   GFR, Estimated >60 >60 mL/min   Anion gap 7 5 - 15  hCG, quantitative, pregnancy   Collection Time: 01/09/21  1:19 PM  Result Value Ref Range   hCG, Beta Chain, Quant, S <1 <5 mIU/mL  Rapid HIV screen (HIV 1/2 Ab+Ag)   Collection Time: 01/09/21  1:19 PM  Result Value Ref Range   HIV-1 P24 Antigen - HIV24 NON REACTIVE NON REACTIVE   HIV 1/2 Antibodies NON REACTIVE NON REACTIVE   Interpretation (HIV Ag Ab)      A non reactive test result means that HIV 1 or HIV 2 antibodies and HIV 1 p24 antigen were not detected in the specimen.  Urinalysis, Routine w reflex  microscopic Urine, Clean Catch   Collection Time: 01/09/21  1:19 PM  Result Value Ref Range   Color, Urine YELLOW YELLOW   APPearance HAZY (A) CLEAR   Specific Gravity, Urine 1.023 1.005 - 1.030   pH 5.0 5.0 - 8.0   Glucose, UA NEGATIVE NEGATIVE mg/dL   Hgb urine dipstick LARGE (A) NEGATIVE   Bilirubin Urine NEGATIVE NEGATIVE   Ketones, ur NEGATIVE NEGATIVE mg/dL   Protein, ur NEGATIVE NEGATIVE mg/dL   Nitrite NEGATIVE NEGATIVE   Leukocytes,Ua SMALL (A) NEGATIVE   RBC / HPF 11-20 0 - 5 RBC/hpf   WBC, UA 6-10 0 - 5 WBC/hpf   Bacteria, UA RARE (A) NONE SEEN   Squamous Epithelial / LPF 6-10 0 - 5   Mucus PRESENT   Type and screen   Collection Time: 01/09/21  1:19 PM  Result Value Ref Range   ABO/RH(D) B NEG    Antibody Screen NEG    Sample Expiration 01/12/2021,2359    Extend sample reason      NO TRANSFUSIONS OR PREGNANCY IN THE PAST 3 MONTHS Performed at [facility], [address] [place], [place] [address]     EKG: Orders placed or performed during the hospital encounter of 01/09/21  . EKG 12-Lead  . EKG 12-Lead    Imaging Studies: No results found.    Assessment: Endometriosis, presumptive Primary dysmenorrhea Menorrhagia Dyspareunia   Plan: TAH+BS possible remove ovaries if indicated, desire preservation  Pt understands the risks of surgery including but not limited t  excessive bleeding requiring transfusion or reoperation, post-operative infection requiring prolonged hospitalization or re-hospitalization and antibiotic therapy, and damage to other organs including bladder, bowel, ureters and major vessels.  The patient also understands the alternative treatment options which were discussed in full.  All questions were answered.  Florian Buff 01/10/2021 5:44 PM   Florian Buff 01/10/2021 5:40 PM

## 2021-01-11 ENCOUNTER — Ambulatory Visit (HOSPITAL_COMMUNITY): Payer: Managed Care, Other (non HMO) | Admitting: Anesthesiology

## 2021-01-11 ENCOUNTER — Ambulatory Visit (HOSPITAL_COMMUNITY)
Admission: RE | Admit: 2021-01-11 | Discharge: 2021-01-11 | Disposition: A | Discharge status: Home / Self Care | Payer: Managed Care, Other (non HMO) | Attending: Obstetrics & Gynecology | Admitting: Obstetrics & Gynecology

## 2021-01-11 ENCOUNTER — Other Ambulatory Visit: Payer: Self-pay

## 2021-01-11 ENCOUNTER — Encounter (HOSPITAL_COMMUNITY)
Admission: RE | Disposition: A | Discharge status: Home / Self Care | Payer: Self-pay | Source: Home / Self Care | Attending: Obstetrics & Gynecology

## 2021-01-11 DIAGNOSIS — N921 Excessive and frequent menstruation with irregular cycle: Secondary | ICD-10-CM | POA: Diagnosis not present

## 2021-01-11 DIAGNOSIS — Z79899 Other long term (current) drug therapy: Secondary | ICD-10-CM | POA: Insufficient documentation

## 2021-01-11 DIAGNOSIS — IMO0002 Reserved for concepts with insufficient information to code with codable children: Secondary | ICD-10-CM

## 2021-01-11 DIAGNOSIS — N803 Endometriosis of pelvic peritoneum: Secondary | ICD-10-CM | POA: Insufficient documentation

## 2021-01-11 DIAGNOSIS — N941 Unspecified dyspareunia: Secondary | ICD-10-CM

## 2021-01-11 DIAGNOSIS — N9419 Other specified dyspareunia: Secondary | ICD-10-CM | POA: Insufficient documentation

## 2021-01-11 DIAGNOSIS — Z888 Allergy status to other drugs, medicaments and biological substances status: Secondary | ICD-10-CM | POA: Diagnosis not present

## 2021-01-11 DIAGNOSIS — N944 Primary dysmenorrhea: Secondary | ICD-10-CM | POA: Diagnosis present

## 2021-01-11 DIAGNOSIS — F17290 Nicotine dependence, other tobacco product, uncomplicated: Secondary | ICD-10-CM | POA: Insufficient documentation

## 2021-01-11 DIAGNOSIS — N800 Endometriosis of uterus: Secondary | ICD-10-CM | POA: Diagnosis not present

## 2021-01-11 DIAGNOSIS — Z6833 Body mass index (BMI) 33.0-33.9, adult: Secondary | ICD-10-CM | POA: Diagnosis not present

## 2021-01-11 DIAGNOSIS — N920 Excessive and frequent menstruation with regular cycle: Secondary | ICD-10-CM | POA: Diagnosis present

## 2021-01-11 DIAGNOSIS — N946 Dysmenorrhea, unspecified: Secondary | ICD-10-CM

## 2021-01-11 HISTORY — PX: SALPINGOOPHORECTOMY: SHX82

## 2021-01-11 HISTORY — PX: ABDOMINAL HYSTERECTOMY: SHX81

## 2021-01-11 LAB — ABO/RH: ABO/RH(D): B NEG

## 2021-01-11 SURGERY — HYSTERECTOMY, ABDOMINAL
Anesthesia: General

## 2021-01-11 MED ORDER — ACETAMINOPHEN 10 MG/ML IV SOLN
1000.0000 mg | Freq: Once | INTRAVENOUS | Status: AC
  Administered 2021-01-11: 1000 mg via INTRAVENOUS

## 2021-01-11 MED ORDER — MIDAZOLAM HCL 2 MG/2ML IJ SOLN
INTRAMUSCULAR | Status: DC | PRN
  Administered 2021-01-11: 2 mg via INTRAVENOUS

## 2021-01-11 MED ORDER — FENTANYL CITRATE (PF) 100 MCG/2ML IJ SOLN
INTRAMUSCULAR | Status: DC | PRN
  Administered 2021-01-11: 50 ug via INTRAVENOUS
  Administered 2021-01-11: 100 ug via INTRAVENOUS
  Administered 2021-01-11 (×2): 50 ug via INTRAVENOUS
  Administered 2021-01-11: 100 ug via INTRAVENOUS
  Administered 2021-01-11: 50 ug via INTRAVENOUS
  Administered 2021-01-11: 100 ug via INTRAVENOUS

## 2021-01-11 MED ORDER — DEXAMETHASONE SODIUM PHOSPHATE 10 MG/ML IJ SOLN
INTRAMUSCULAR | Status: AC
  Filled 2021-01-11: qty 1

## 2021-01-11 MED ORDER — ORAL CARE MOUTH RINSE: 15.0000 mL | Freq: Once | OROMUCOSAL | Status: AC

## 2021-01-11 MED ORDER — ACETAMINOPHEN 10 MG/ML IV SOLN
INTRAVENOUS | Status: AC
  Filled 2021-01-11: qty 100

## 2021-01-11 MED ORDER — SODIUM CHLORIDE 0.9 % IV SOLN
INTRAVENOUS | Status: DC | PRN
  Administered 2021-01-11: 40 mL

## 2021-01-11 MED ORDER — MIDAZOLAM HCL 2 MG/2ML IJ SOLN
INTRAMUSCULAR | Status: AC
  Filled 2021-01-11: qty 2

## 2021-01-11 MED ORDER — KETOROLAC TROMETHAMINE 10 MG PO TABS: 10.0000 mg | ORAL_TABLET | Freq: Three times a day (TID) | ORAL | 0 refills | Status: DC | PRN

## 2021-01-11 MED ORDER — SUCCINYLCHOLINE CHLORIDE 200 MG/10ML IV SOSY
PREFILLED_SYRINGE | INTRAVENOUS | Status: AC
  Filled 2021-01-11: qty 10

## 2021-01-11 MED ORDER — DEXAMETHASONE SODIUM PHOSPHATE 10 MG/ML IJ SOLN
INTRAMUSCULAR | Status: DC | PRN
  Administered 2021-01-11: 10 mg via INTRAVENOUS

## 2021-01-11 MED ORDER — ONDANSETRON HCL 4 MG/2ML IJ SOLN
INTRAMUSCULAR | Status: AC
  Filled 2021-01-11: qty 2

## 2021-01-11 MED ORDER — ONDANSETRON HCL 4 MG/2ML IJ SOLN
INTRAMUSCULAR | Status: DC | PRN
  Administered 2021-01-11: 4 mg via INTRAVENOUS

## 2021-01-11 MED ORDER — LACTATED RINGERS IV SOLN: INTRAVENOUS | Status: DC

## 2021-01-11 MED ORDER — KETOROLAC TROMETHAMINE 30 MG/ML IJ SOLN
30.0000 mg | Freq: Once | INTRAMUSCULAR | Status: AC
  Administered 2021-01-11: 30 mg via INTRAVENOUS

## 2021-01-11 MED ORDER — SODIUM CHLORIDE 0.9 % IR SOLN
Status: DC | PRN
  Administered 2021-01-11 (×2): 1000 mL

## 2021-01-11 MED ORDER — BUPIVACAINE LIPOSOME 1.3 % IJ SUSP
INTRAMUSCULAR | Status: AC
  Filled 2021-01-11: qty 20

## 2021-01-11 MED ORDER — CEFAZOLIN SODIUM-DEXTROSE 2-4 GM/100ML-% IV SOLN
INTRAVENOUS | Status: AC
  Filled 2021-01-11: qty 100

## 2021-01-11 MED ORDER — IBUPROFEN 800 MG PO TABS: 800.0000 mg | ORAL_TABLET | Freq: Three times a day (TID) | ORAL | 0 refills | Status: DC | PRN

## 2021-01-11 MED ORDER — PROPOFOL 10 MG/ML IV BOLUS
INTRAVENOUS | Status: AC
  Filled 2021-01-11: qty 40

## 2021-01-11 MED ORDER — KETOROLAC TROMETHAMINE 30 MG/ML IJ SOLN
INTRAMUSCULAR | Status: AC
  Filled 2021-01-11: qty 1

## 2021-01-11 MED ORDER — ROCURONIUM BROMIDE 10 MG/ML (PF) SYRINGE
PREFILLED_SYRINGE | INTRAVENOUS | Status: AC
  Filled 2021-01-11: qty 10

## 2021-01-11 MED ORDER — LIDOCAINE HCL (PF) 2 % IJ SOLN
INTRAMUSCULAR | Status: AC
  Filled 2021-01-11: qty 5

## 2021-01-11 MED ORDER — POVIDONE-IODINE 10 % EX SWAB: 2.0000 "application " | Freq: Once | CUTANEOUS | Status: DC

## 2021-01-11 MED ORDER — ROCURONIUM BROMIDE 100 MG/10ML IV SOLN
INTRAVENOUS | Status: DC | PRN
  Administered 2021-01-11: 20 mg via INTRAVENOUS
  Administered 2021-01-11: 50 mg via INTRAVENOUS

## 2021-01-11 MED ORDER — CHLORHEXIDINE GLUCONATE 0.12 % MT SOLN
15.0000 mL | Freq: Once | OROMUCOSAL | Status: AC
  Administered 2021-01-11: 15 mL via OROMUCOSAL

## 2021-01-11 MED ORDER — ONDANSETRON 8 MG PO TBDP: 8.0000 mg | ORAL_TABLET | Freq: Three times a day (TID) | ORAL | 0 refills | Status: DC | PRN

## 2021-01-11 MED ORDER — SODIUM CHLORIDE (PF) 0.9 % IJ SOLN
INTRAMUSCULAR | Status: AC
  Filled 2021-01-11: qty 40

## 2021-01-11 MED ORDER — FENTANYL CITRATE (PF) 250 MCG/5ML IJ SOLN
INTRAMUSCULAR | Status: AC
  Filled 2021-01-11: qty 5

## 2021-01-11 MED ORDER — CEFAZOLIN SODIUM-DEXTROSE 2-4 GM/100ML-% IV SOLN
2.0000 g | INTRAVENOUS | Status: AC
  Administered 2021-01-11: 2 g via INTRAVENOUS

## 2021-01-11 MED ORDER — SUGAMMADEX SODIUM 500 MG/5ML IV SOLN
INTRAVENOUS | Status: DC | PRN
  Administered 2021-01-11: 200 mg via INTRAVENOUS

## 2021-01-11 MED ORDER — HYDROMORPHONE HCL 1 MG/ML IJ SOLN: 0.2500 mg | INTRAMUSCULAR | Status: DC | PRN

## 2021-01-11 MED ORDER — HEMOSTATIC AGENTS (NO CHARGE) OPTIME
TOPICAL | Status: DC | PRN
  Administered 2021-01-11: 1 via TOPICAL

## 2021-01-11 MED ORDER — SUCCINYLCHOLINE CHLORIDE 20 MG/ML IJ SOLN
INTRAMUSCULAR | Status: DC | PRN
  Administered 2021-01-11: 120 mg via INTRAVENOUS

## 2021-01-11 MED ORDER — ONDANSETRON HCL 4 MG/2ML IJ SOLN
4.0000 mg | Freq: Once | INTRAMUSCULAR | Status: AC | PRN
  Administered 2021-01-11: 4 mg via INTRAVENOUS
  Filled 2021-01-11: qty 2

## 2021-01-11 MED ORDER — OXYCODONE-ACETAMINOPHEN 7.5-325 MG PO TABS: 1.0000 | ORAL_TABLET | Freq: Four times a day (QID) | ORAL | 0 refills | Status: DC | PRN

## 2021-01-11 MED ORDER — OXYCODONE-ACETAMINOPHEN 5-325 MG PO TABS
1.0000 | ORAL_TABLET | ORAL | Status: DC | PRN
  Administered 2021-01-11: 2 via ORAL
  Filled 2021-01-11: qty 2

## 2021-01-11 MED ORDER — PROPOFOL 10 MG/ML IV BOLUS
INTRAVENOUS | Status: DC | PRN
  Administered 2021-01-11: 200 mg via INTRAVENOUS

## 2021-01-11 MED ORDER — SUGAMMADEX SODIUM 500 MG/5ML IV SOLN
INTRAVENOUS | Status: AC
  Filled 2021-01-11: qty 5

## 2021-01-11 MED ORDER — GABAPENTIN 300 MG PO CAPS: 300.0000 mg | ORAL_CAPSULE | Freq: Three times a day (TID) | ORAL | 0 refills | Status: DC

## 2021-01-11 MED ORDER — FENTANYL CITRATE (PF) 100 MCG/2ML IJ SOLN
25.0000 ug | Freq: Once | INTRAMUSCULAR | Status: AC
  Administered 2021-01-11: 50 ug via INTRAVENOUS
  Filled 2021-01-11: qty 2

## 2021-01-11 MED ORDER — BUPIVACAINE LIPOSOME 1.3 % IJ SUSP
20.0000 mL | Freq: Once | INTRAMUSCULAR | Status: DC
  Filled 2021-01-11: qty 20

## 2021-01-11 SURGICAL SUPPLY — 52 items
ADH SKN CLS APL DERMABOND .7 (GAUZE/BANDAGES/DRESSINGS) ×2
CELLS DAT CNTRL 66122 CELL SVR (MISCELLANEOUS) ×2 IMPLANT
CLOTH BEACON ORANGE TIMEOUT ST (SAFETY) ×3 IMPLANT
COVER LIGHT HANDLE STERIS (MISCELLANEOUS) ×6 IMPLANT
COVER WAND RF STERILE (DRAPES) ×6 IMPLANT
DERMABOND ADVANCED (GAUZE/BANDAGES/DRESSINGS) ×1
DERMABOND ADVANCED .7 DNX12 (GAUZE/BANDAGES/DRESSINGS) ×2 IMPLANT
DRAPE WARM FLUID 44X44 (DRAPES) ×3 IMPLANT
DRSG OPSITE POSTOP 4X8 (GAUZE/BANDAGES/DRESSINGS) ×3 IMPLANT
ELECT REM PT RETURN 9FT ADLT (ELECTROSURGICAL) ×3
ELECTRODE REM PT RTRN 9FT ADLT (ELECTROSURGICAL) ×2 IMPLANT
GAUZE 4X4 16PLY RFD (DISPOSABLE) ×3 IMPLANT
GLOVE BIOGEL M 6.5 STRL (GLOVE) ×3 IMPLANT
GLOVE BIOGEL PI IND STRL 6 (GLOVE) ×2 IMPLANT
GLOVE BIOGEL PI IND STRL 7.0 (GLOVE) ×8 IMPLANT
GLOVE BIOGEL PI IND STRL 8 (GLOVE) ×2 IMPLANT
GLOVE BIOGEL PI INDICATOR 6 (GLOVE) ×1
GLOVE BIOGEL PI INDICATOR 7.0 (GLOVE) ×4
GLOVE BIOGEL PI INDICATOR 8 (GLOVE) ×1
GLOVE ECLIPSE 7.0 STRL STRAW (GLOVE) ×6 IMPLANT
GLOVE ECLIPSE 8.0 STRL XLNG CF (GLOVE) ×3 IMPLANT
GLOVE INDICATOR 7.0 STRL GRN (GLOVE) ×9 IMPLANT
GOWN STRL REUS W/TWL LRG LVL3 (GOWN DISPOSABLE) ×6 IMPLANT
GOWN STRL REUS W/TWL XL LVL3 (GOWN DISPOSABLE) ×3 IMPLANT
HEMOSTAT ARISTA ABSORB 3G PWDR (HEMOSTASIS) ×3 IMPLANT
INST SET MAJOR GENERAL (KITS) ×3 IMPLANT
KIT BLADEGUARD II DBL (SET/KITS/TRAYS/PACK) ×3 IMPLANT
KIT TURNOVER KIT A (KITS) ×3 IMPLANT
MANIFOLD NEPTUNE II (INSTRUMENTS) ×3 IMPLANT
NEEDLE HYPO 18GX1.5 BLUNT FILL (NEEDLE) ×3 IMPLANT
NEEDLE HYPO 21X1.5 SAFETY (NEEDLE) ×3 IMPLANT
NS IRRIG 1000ML POUR BTL (IV SOLUTION) ×6 IMPLANT
PACK MAJOR ABDOMINAL (CUSTOM PROCEDURE TRAY) ×3 IMPLANT
PAD ARMBOARD 7.5X6 YLW CONV (MISCELLANEOUS) ×3 IMPLANT
PENCIL SMOKE EVACUATOR (MISCELLANEOUS) ×3 IMPLANT
RTRCTR WOUND ALEXIS 18CM MED (MISCELLANEOUS) ×3
SET BASIN LINEN APH (SET/KITS/TRAYS/PACK) ×3 IMPLANT
SPONGE LAP 18X18 RF (DISPOSABLE) ×6 IMPLANT
SUT CHROMIC 0 CT 1 (SUTURE) ×6 IMPLANT
SUT MON AB 3-0 SH 27 (SUTURE) ×6 IMPLANT
SUT PLAIN 2 0 XLH (SUTURE) IMPLANT
SUT VIC AB 0 CT1 27 (SUTURE) ×9
SUT VIC AB 0 CT1 27XBRD ANTBC (SUTURE) IMPLANT
SUT VIC AB 0 CT1 27XCR 8 STRN (SUTURE) ×6 IMPLANT
SUT VIC AB 0 CTX 36 (SUTURE) ×3
SUT VIC AB 0 CTX36XBRD ANTBCTR (SUTURE) ×2 IMPLANT
SUT VICRYL 3 0 (SUTURE) ×3 IMPLANT
SYR 20ML LL LF (SYRINGE) ×6 IMPLANT
SYR 50ML LL SCALE MARK (SYRINGE) ×3 IMPLANT
SYR BULB IRRIG 60ML STRL (SYRINGE) ×3 IMPLANT
TOWEL SURG RFD BLUE STRL DISP (DISPOSABLE) ×3 IMPLANT
TRAY FOLEY MTR SLVR 16FR STAT (SET/KITS/TRAYS/PACK) ×3 IMPLANT

## 2021-01-11 NOTE — Anesthesia Postprocedure Evaluation (Signed)
Anesthesia Post Note  Patient: Barbara Lam  Procedure(s) Performed: HYSTERECTOMY ABDOMINAL (N/A ) OPEN SALPINGO OOPHORECTOMY (Bilateral )  Patient location during evaluation: Short Stay Anesthesia Type: General Level of consciousness: awake and alert and patient cooperative Pain management: satisfactory to patient Vital Signs Assessment: post-procedure vital signs reviewed and stable Respiratory status: spontaneous breathing Cardiovascular status: stable Postop Assessment: no apparent nausea or vomiting Anesthetic complications: no   No complications documented.   Last Vitals:  Vitals:   01/11/21 1237 01/11/21 1255  BP:  129/81  Pulse: 100 78  Resp: (!) 21 18  Temp:  36.5 C  SpO2: 97% 96%    Last Pain:  Vitals:   01/11/21 1255  TempSrc: Oral  PainSc:                  Drucie Opitz

## 2021-01-11 NOTE — Interval H&P Note (Signed)
History and Physical Interval Note:  01/11/2021 8:20 AM  Barbara Lam  has presented today for surgery, with the diagnosis of Endometriosis, dysmenorrhea, menometrorrhgia, dyspareunia.  The various methods of treatment have been discussed with the patient and family. After consideration of risks, benefits and other options for treatment, the patient has consented to  Procedure(s): HYSTERECTOMY ABDOMINAL (N/A) OPEN BILATERAL SALPINGECTOMY with possible removal of the Ovaries (Bilateral) as a surgical intervention.  The patient's history has been reviewed, patient examined, no change in status, stable for surgery.  I have reviewed the patient's chart and labs.  Questions were answered to the patient's satisfaction.     Florian Buff

## 2021-01-11 NOTE — Discharge Instructions (Signed)
Post Operative Pain Med Plan:  >Take gabapentin 300 mg three times per day, as prescribed for 4 days, try to space them evenly  >Take the oxycodone 1 tablet, on a schedule, around the clock, every 6 hours(set your phone alarm) for the first 2 days, there may    Be times when you will need 2 tablets but taking them on a schedule will decrease this need  >Then take the oxycodone on an as needed basis per the prescription instructions  >Take the ketorolac or toradol every 8 hours for the first 3 days then the remainder to supplement the oxycodone  >After the toradol is gone then use the ibuprofen as ordered as needed to help supplement the oxycodone  >Use a heating pad as well as needed  >Of course the zofran is available to take as prescribed when needed for nausea  >Be gentle with your diet the first few days, liquids and soft non spicy food, fruits are great  >Get up and move, no lifting or straining  Dr Elonda Husky     Abdominal Hysterectomy, Care After The following information offers guidance on how to care for yourself after your procedure. Your health care provider may also give you more specific instructions. If you have problems or questions, contact your health care provider. What can I expect after the procedure? After the procedure, it is common to have:  Pain.  Tiredness (fatigue).  Poor appetite.  Less interest in sex.  Vaginal bleeding and discharge. You may need to use a sanitary napkin after this procedure.  Constipation.  Feelings of sadness or other emotions. Follow these instructions at home: Medicines  Take over-the-counter and prescription medicines only as told by your health care provider.  Do not take aspirin or NSAIDs, such as ibuprofen. These medicines can cause bleeding.  If you were prescribed an antibiotic medicine, take it as told by your health care provider. Do not stop using the antibiotic even if you start to feel better.  Ask your  health care provider if the medicine prescribed to you: ? Requires you to avoid driving or using machinery. ? Can cause constipation. You may need to take these actions to prevent or treat constipation:  Drink enough fluid to keep your urine pale yellow.  Take over-the-counter or prescription medicines.  Eat foods that are high in fiber, such as beans, whole grains, and fresh fruits and vegetables.  Limit foods that are high in fat and processed sugars, such as fried or sweet foods. Incision care  Follow instructions from your health care provider about how to take care of your incision. Make sure you: ? Wash your hands with soap and water for at least 20 seconds before and after you change your bandage (dressing). If soap and water are not available, use hand sanitizer. ? Change your dressing as told by your health care provider. ? Leave stitches (sutures), skin glue, or adhesive strips in place. These skin closures may need to stay in place for 2 weeks or longer. If adhesive strip edges start to loosen and curl up, you may trim the loose edges. Do not remove adhesive strips completely unless your health care provider tells you to do that.  Keep the dressing dry until your health care provider says it can be removed.  Check your incision area every day for signs of infection. Check for: ? More redness, swelling, or pain. ? Fluid or blood. ? Warmth. ? Pus or a bad smell.   Activity  Rest as told by your health care provider.  Avoid sitting for a long time without moving. Get up to take short walks every 1-2 hours. This is important to improve blood flow and breathing. Ask for help if you feel weak or unsteady.  Do not lift anything that is heavier than 10 lb (4.5 kg), or the limit that you are told, until your health care provider says that it is safe.  Follow instructions from your health care provider about exercise, driving, and general activities.  Return to your normal  activities as told by your health care provider. Ask your health care provider what activities are safe for you.   Lifestyle  Do not douche, use tampons, or have sex for at least 6 weeks or as told by your health care provider.  Do not drink alcohol until your health care provider approves.  Do not use any products that contain nicotine or tobacco. These products include cigarettes, chewing tobacco, and vaping devices, such as e-cigarettes. These can delay healing after surgery. If you need help quitting, ask your health care provider. General instructions  If you struggle with physical or emotional changes after your procedure, speak with your health care provider or a therapist.  Do not take baths, swim, or use a hot tub until your health care provider approves. Ask your health care provider if you may take showers. You may only be allowed to take sponge baths.  Try to have a responsible adult at home with you for the first 1-2 weeks to help with your daily chores.  Wear compression stockings as told by your health care provider. These stockings help to prevent blood clots and reduce swelling in your legs.  Keep all follow-up visits. This is important. Contact a health care provider if:  You have any of these signs of infection: ? Chills or a fever. ? More redness, swelling, warmth, or pain around your incision. ? Fluid or blood coming from your incision. ? Pus or a bad smell coming from your incision.  Your incision opens.  You feel dizzy or light-headed.  You have pain or bleeding when you urinate.  You have diarrhea that does not go away.  You have nausea and vomiting that do not go away.  You have pus, or a bad-smelling discharge coming from your vagina.  You have any type of abnormal reaction such as a rash, or you develop an allergy to your medicine.  Your pain medicine does not help. Get help right away if:  You have a fever and your symptoms suddenly get  worse.  You have severe pain in your abdomen.  You have shortness of breath.  You faint.  You have pain, swelling, or redness in your leg.  You have heavy vaginal bleeding and blood clots, soaking through a sanitary napkin in less than 1 hour. These symptoms may represent a serious problem that is an emergency. Do not wait to see if the symptoms will go away. Get medical help right away. Call your local emergency services (911 in the [place] Do not drive yourself to the hospital. Summary  After your procedure, it is common to have pain, tiredness, and vaginal discharge.  Do not lift anything that is heavier than 10 lb (4.5 kg), or the limit that you are told, until your health care provider says that it is safe.  Follow instructions from your health care provider about exercise, driving, and general activities. Ask what activities are safe for you.  Do not take baths, swim, or use a hot tub until your health care provider approves. Ask your health care provider if you may take showers. You may only be allowed to take sponge baths.  Try to have a responsible adult at home with you for the first 1-2 weeks to help with your daily chores. This information is not intended to replace advice given to you by your health care provider. Make sure you discuss any questions you have with your health care provider. Document Revised: 08/04/2020 Document Reviewed: 08/04/2020 Elsevier Patient Education  2021 [place] Anesthesia, Adult, Care After This sheet gives you information about how to care for yourself after your procedure. Your health care provider may also give you more specific instructions. If you have problems or questions, contact your health care provider. What can I expect after the procedure? After the procedure, the following side effects are common:  Pain or discomfort at the IV site.  Nausea.  Vomiting.  Sore throat.  Trouble concentrating.  Feeling  cold or chills.  Feeling weak or tired.  Sleepiness and fatigue.  Soreness and body aches. These side effects can affect parts of the body that were not involved in surgery. Follow these instructions at home: For the time period you were told by your health care provider:  Rest.  Do not participate in activities where you could fall or become injured.  Do not drive or use machinery.  Do not drink alcohol.  Do not take sleeping pills or medicines that cause drowsiness.  Do not make important decisions or sign legal documents.  Do not take care of children on your own.   Eating and drinking  Follow any instructions from your health care provider about eating or drinking restrictions.  When you feel hungry, start by eating small amounts of foods that are soft and easy to digest (bland), such as toast. Gradually return to your regular diet.  Drink enough fluid to keep your urine pale yellow.  If you vomit, rehydrate by drinking water, juice, or clear broth. General instructions  If you have sleep apnea, surgery and certain medicines can increase your risk for breathing problems. Follow instructions from your health care provider about wearing your sleep device: ? Anytime you are sleeping, including during daytime naps. ? While taking prescription pain medicines, sleeping medicines, or medicines that make you drowsy.  Have a responsible adult stay with you for the time you are told. It is important to have someone help care for you until you are awake and alert.  Return to your normal activities as told by your health care provider. Ask your health care provider what activities are safe for you.  Take over-the-counter and prescription medicines only as told by your health care provider.  If you smoke, do not smoke without supervision.  Keep all follow-up visits as told by your health care provider. This is important. Contact a health care provider if:  You have nausea or  vomiting that does not get better with medicine.  You cannot eat or drink without vomiting.  You have pain that does not get better with medicine.  You are unable to pass urine.  You develop a skin rash.  You have a fever.  You have redness around your IV site that gets worse. Get help right away if:  You have difficulty breathing.  You have chest pain.  You have blood in your urine or stool, or you vomit blood.  Summary  After the procedure, it is common to have a sore throat or nausea. It is also common to feel tired.  Have a responsible adult stay with you for the time you are told. It is important to have someone help care for you until you are awake and alert.  When you feel hungry, start by eating small amounts of foods that are soft and easy to digest (bland), such as toast. Gradually return to your regular diet.  Drink enough fluid to keep your urine pale yellow.  Return to your normal activities as told by your health care provider. Ask your health care provider what activities are safe for you. This information is not intended to replace advice given to you by your health care provider. Make sure you discuss any questions you have with your health care provider. Document Revised: 08/18/2020 Document Reviewed: 03/17/2020 Elsevier Patient Education  2021 [place] UNTIL Sunday January 15, 2021.  DO NOT USE ADDITIONAL NUMBING MEDICATIONS WITHOUT CONSULTING A PHYSICIAN UNTIL AFTER Sunday   Bupivacaine Liposomal Suspension for Injection What is this medicine? BUPIVACAINE LIPOSOMAL (bue PIV a kane LIP oh som al) is an anesthetic. It causes loss of feeling in the skin or other tissues. It is used to prevent and to treat pain from some procedures. This medicine may be used for other purposes; ask your health care provider or pharmacist if you have questions. COMMON BRAND NAME(S): EXPAREL What should I tell my health care  provider before I take this medicine? They need to know if you have any of these conditions:  G6PD deficiency  heart disease  kidney disease  liver disease  low blood pressure  lung or breathing disease, like asthma  an unusual or allergic reaction to bupivacaine, other medicines, foods, dyes, or preservatives  pregnant or trying to get pregnant  breast-feeding How should I use this medicine? This medicine is injected into the affected area. It is given by a health care provider in a hospital or clinic setting. Talk to your health care provider about the use of this medicine in children. While it may be given to children as young as 6 years for selected conditions, precautions do apply. Overdosage: If you think you have taken too much of this medicine contact a poison control center or emergency room at once. NOTE: This medicine is only for you. Do not share this medicine with others. What if I miss a dose? This does not apply. What may interact with this medicine? This medicine may interact with the following medications:  acetaminophen  certain antibiotics like dapsone, nitrofurantoin, aminosalicylic acid, sulfonamides  certain medicines for seizures like phenobarbital, phenytoin, valproic acid  chloroquine  cyclophosphamide  flutamide  hydroxyurea  ifosfamide  metoclopramide  nitric oxide  nitroglycerin  nitroprusside  nitrous oxide  other local anesthetics like lidocaine, pramoxine, tetracaine  primaquine  quinine  rasburicase  sulfasalazine This list may not describe all possible interactions. Give your health care provider a list of all the medicines, herbs, non-prescription drugs, or dietary supplements you use. Also tell them if you smoke, drink alcohol, or use illegal drugs. Some items may interact with your medicine. What should I watch for while using this medicine? Your condition will be monitored carefully while you are receiving this  medicine. Be careful to avoid injury while the area is numb, and you are not aware of pain. What side effects may I notice from receiving  this medicine? Side effects that you should report to your doctor or health care professional as soon as possible:  allergic reactions like skin rash, itching or hives, swelling of the face, lips, or tongue  seizures  signs and symptoms of a dangerous change in heartbeat or heart rhythm like chest pain; dizziness; fast, irregular heartbeat; palpitations; feeling faint or lightheaded; falls; breathing problems  signs and symptoms of methemoglobinemia such as pale, gray, or blue colored skin; headache; fast heartbeat; shortness of breath; feeling faint or lightheaded, falls; tiredness Side effects that usually do not require medical attention (report to your doctor or health care professional if they continue or are bothersome):  anxious  back pain  changes in taste  changes in vision  constipation  dizziness  fever  nausea, vomiting This list may not describe all possible side effects. Call your doctor for medical advice about side effects. You may report side effects to FDA at 1-800-FDA-1088. Where should I keep my medicine? This drug is given in a hospital or clinic and will not be stored at home. NOTE: This sheet is a summary. It may not cover all possible information. If you have questions about this medicine, talk to your doctor, pharmacist, or health care provider.  2021 Elsevier/Gold Standard (2020-03-10 12:24:57)    Gabapentin capsules or tablets What is this medicine? GABAPENTIN (GA ba pen tin) is used to control seizures in certain types of epilepsy. It is also used to treat certain types of nerve pain. This medicine may be used for other purposes; ask your health care provider or pharmacist if you have questions. COMMON BRAND NAME(S): Active-PAC with Gabapentin, Ascencion Dike, Gralise, Neurontin What should I tell my health care  provider before I take this medicine? They need to know if you have any of these conditions:  history of drug abuse or alcohol abuse problem  kidney disease  lung or breathing disease  suicidal thoughts, plans, or attempt; a previous suicide attempt by you or a family member  an unusual or allergic reaction to gabapentin, other medicines, foods, dyes, or preservatives  pregnant or trying to get pregnant  breast-feeding How should I use this medicine? Take this medicine by mouth with a glass of water. Follow the directions on the prescription label. You can take it with or without food. If it upsets your stomach, take it with food. Take your medicine at regular intervals. Do not take it more often than directed. Do not stop taking except on your doctor's advice. If you are directed to break the 600 or 800 mg tablets in half as part of your dose, the extra half tablet should be used for the next dose. If you have not used the extra half tablet within 28 days, it should be thrown away. A special MedGuide will be given to you by the pharmacist with each prescription and refill. Be sure to read this information carefully each time. Talk to your pediatrician regarding the use of this medicine in children. While this drug may be prescribed for children as young as 3 years for selected conditions, precautions do apply. Overdosage: If you think you have taken too much of this medicine contact a poison control center or emergency room at once. NOTE: This medicine is only for you. Do not share this medicine with others. What if I miss a dose? If you miss a dose, take it as soon as you can. If it is almost time for your next dose, take only that dose. Do  not take double or extra doses. What may interact with this medicine? This medicine may interact with the following medications:  alcohol  antihistamines for allergy, cough, and cold  certain medicines for anxiety or sleep  certain medicines  for depression like amitriptyline, fluoxetine, sertraline  certain medicines for seizures like phenobarbital, primidone  certain medicines for stomach problems  general anesthetics like halothane, isoflurane, methoxyflurane, propofol  local anesthetics like lidocaine, pramoxine, tetracaine  medicines that relax muscles for surgery  narcotic medicines for pain  phenothiazines like chlorpromazine, mesoridazine, prochlorperazine, thioridazine This list may not describe all possible interactions. Give your health care provider a list of all the medicines, herbs, non-prescription drugs, or dietary supplements you use. Also tell them if you smoke, drink alcohol, or use illegal drugs. Some items may interact with your medicine. What should I watch for while using this medicine? Visit your doctor or health care provider for regular checks on your progress. You may want to keep a record at home of how you feel your condition is responding to treatment. You may want to share this information with your doctor or health care provider at each visit. You should contact your doctor or health care provider if your seizures get worse or if you have any new types of seizures. Do not stop taking this medicine or any of your seizure medicines unless instructed by your doctor or health care provider. Stopping your medicine suddenly can increase your seizures or their severity. This medicine may cause serious skin reactions. They can happen weeks to months after starting the medicine. Contact your health care provider right away if you notice fevers or flu-like symptoms with a rash. The rash may be red or purple and then turn into blisters or peeling of the skin. Or, you might notice a red rash with swelling of the face, lips or lymph nodes in your neck or under your arms. Wear a medical identification bracelet or chain if you are taking this medicine for seizures, and carry a card that lists all your medications. You  may get drowsy, dizzy, or have blurred vision. Do not drive, use machinery, or do anything that needs mental alertness until you know how this medicine affects you. To reduce dizzy or fainting spells, do not sit or stand up quickly, especially if you are an older patient. Alcohol can increase drowsiness and dizziness. Avoid alcoholic drinks. Your mouth may get dry. Chewing sugarless gum or sucking hard candy, and drinking plenty of water will help. The use of this medicine may increase the chance of suicidal thoughts or actions. Pay special attention to how you are responding while on this medicine. Any worsening of mood, or thoughts of suicide or dying should be reported to your health care provider right away. Women who become pregnant while using this medicine may enroll in the [place] Pregnancy Registry by calling [phone]. This registry collects information about the safety of antiepileptic drug use during pregnancy. What side effects may I notice from receiving this medicine? Side effects that you should report to your doctor or health care professional as soon as possible:  allergic reactions like skin rash, itching or hives, swelling of the face, lips, or tongue  breathing problems  rash, fever, and swollen lymph nodes  redness, blistering, peeling or loosening of the skin, including inside the mouth  suicidal thoughts, mood changes Side effects that usually do not require medical attention (report to your doctor or health care professional if they continue  or are bothersome):  dizziness  drowsiness  headache  nausea, vomiting  swelling of ankles, feet, hands  tiredness This list may not describe all possible side effects. Call your doctor for medical advice about side effects. You may report side effects to FDA at 1-800-FDA-1088. Where should I keep my medicine? Keep out of reach of children. This medicine may cause accidental overdose and  death if it taken by other adults, children, or pets. Mix any unused medicine with a substance like cat litter or coffee grounds. Then throw the medicine away in a sealed container like a sealed bag or a coffee can with a lid. Do not use the medicine after the expiration date. Store at room temperature between 15 and 30 degrees C (59 and 86 degrees F). NOTE: This sheet is a summary. It may not cover all possible information. If you have questions about this medicine, talk to your doctor, pharmacist, or health care provider.  2021 Elsevier/Gold Standard (2019-03-06 14:16:43)   Acetaminophen; Oxycodone tablets What is this medicine? ACETAMINOPHEN; OXYCODONE (a set a MEE noe fen; ox i KOE done) is a pain reliever. It is used to treat moderate to severe pain. This medicine may be used for other purposes; ask your health care provider or pharmacist if you have questions. COMMON BRAND NAME(S): Endocet, Magnacet, Nalocet, Narvox, Percocet, Perloxx, Primalev, Primlev, Prolate, Roxicet, Xolox What should I tell my health care provider before I take this medicine? They need to know if you have any of these conditions:  brain tumor  drug abuse or addiction  head injury  heart disease  if you often drink alcohol   kidney disease   liver disease  low adrenal gland function  lung disease, asthma, or breathing problem  seizures  stomach or intestine problems  taken an MAOI like Marplan, Nardil, or Parnate in the last 14 days  an unusual or allergic reaction to acetaminophen, oxycodone, other medicines, foods, dyes, or preservative  pregnant or trying to get pregnant  breast-feeding How should I use this medicine? Take this medicine by mouth with a full glass of water. Take it as directed on the label. You can take it with or without food. If it upsets your stomach, take it with food. Do not use it more often than directed. There may be unused or extra doses in the bottle after you  finish your treatment. Talk to your health care provider if you have questions about your dose. A special MedGuide will be given to you by the pharmacist with each prescription and refill. Be sure to read this information carefully each time. Talk to your health care provider about the use of this medicine in children. Special care may be needed. Patients over 49 years of age may have a stronger reaction and need a smaller dose. Overdosage: If you think you have taken too much of this medicine contact a poison control center or emergency room at once. NOTE: This medicine is only for you. Do not share this medicine with others. What if I miss a dose? This does not apply. This medicine is not for regular use. It should only be used as needed. What may interact with this medicine? This medicine may interact with the following medications:  alcohol  antihistamines for allergy, cough and cold  antiviral medicines for HIV or AIDS  atropine  certain antibiotics like clarithromycin, erythromycin, linezolid, rifampin  certain medicines for anxiety or sleep  certain medicines for bladder problems like oxybutynin, tolterodine  certain medicines for depression like amitriptyline, fluoxetine, sertraline  certain medicines for fungal infections like ketoconazole, itraconazole, voriconazole  certain medicines for migraine headache like almotriptan, eletriptan, frovatriptan, naratriptan, rizatriptan, sumatriptan, zolmitriptan  certain medicines for nausea or vomiting like dolasetron, ondansetron, palonosetron  certain medicines for Parkinson's disease like benztropine, trihexyphenidyl  certain medicines for seizures like phenobarbital, phenytoin, primidone  certain medicines for stomach problems like dicyclomine, hyoscyamine  certain medicines for travel sickness like scopolamine  diuretics  general anesthetics like halothane, isoflurane, methoxyflurane, propofol  ipratropium  local  anesthetics like lidocaine, pramoxine, tetracaine  MAOIs like Carbex, Eldepryl, Marplan, Nardil, and Parnate  medicines that relax muscles for surgery  methylene blue  nilotinib  other medicines with acetaminophen  other narcotic medicines for pain or cough  phenothiazines like chlorpromazine, mesoridazine, prochlorperazine, thioridazine This list may not describe all possible interactions. Give your health care provider a list of all the medicines, herbs, non-prescription drugs, or dietary supplements you use. Also tell them if you smoke, drink alcohol, or use illegal drugs. Some items may interact with your medicine. What should I watch for while using this medicine? Tell your health care provider if your pain does not go away, if it gets worse, or if you have new or a different type of pain. You may develop tolerance to this drug. Tolerance means that you will need a higher dose of the drug for pain relief. Tolerance is normal and is expected if you take this drug for a long time. There are different types of narcotic drugs (opioids) for pain. If you take more than one type at the same time, you may have more side effects. Give your health care provider a list of all drugs you use. He or she will tell you how much drug to take. Do not take more drug than directed. Get emergency help right away if you have problems breathing. Do not suddenly stop taking your drug because you may develop a severe reaction. Your body becomes used to the drug. This does NOT mean you are addicted. Addiction is a behavior related to getting and using a drug for a nonmedical reason. If you have pain, you have a medical reason to take pain drug. Your health care provider will tell you how much drug to take. If your health care provider wants you to stop the drug, the dose will be slowly lowered over time to avoid any side effects. Talk to your health care provider about naloxone and how to get it. Naloxone is an  emergency drug used for an opioid overdose. An overdose can happen if you take too much opioid. It can also happen if an opioid is taken with some other drugs or substances, like alcohol. Know the symptoms of an overdose, like trouble breathing, unusually tired or sleepy, or not being able to respond or wake up. Make sure to tell caregivers and close contacts where it is stored. Make sure they know how to use it. After naloxone is given, you must get emergency help right away. Naloxone is a temporary treatment. Repeat doses may be needed. Do not take other drugs that contain acetaminophen with this drug. Many non-prescription drugs contain acetaminophen. Always read labels carefully. If you have questions, ask your health care provider. If you take too much acetaminophen, get medical help right away. Too much acetaminophen can be very dangerous and cause liver damage. Even if you do not have symptoms, it is important to get help right away. This drug does  not prevent a heart attack or stroke. This drug may increase the chance of a heart attack or stroke. The chance may increase the longer you use this drug or if you have heart disease. If you take aspirin to prevent a heart attack or stroke, talk to your health care provider about using this drug. You may get drowsy or dizzy. Do not drive, use machinery, or do anything that needs mental alertness until you know how this drug affects you. Do not stand up or sit up quickly, especially if you are an older patient. This reduces the risk of dizzy or fainting spells. Alcohol may interfere with the effect of this drug. Avoid alcoholic drinks. This drug will cause constipation. If you do not have a bowel movement for 3 days, call your health care provider. Your mouth may get dry. Chewing sugarless gum or sucking hard candy and drinking plenty of water may help. Contact your health care provider if the problem does not go away or is severe. What side effects may I  notice from receiving this medicine? Side effects that you should report to your doctor or health care professional as soon as possible:  allergic reactions (skin rash, itching or hives; swelling of the face, lips, or tongue)  confusion  kidney injury (trouble passing urine or change in the amount of urine)  light-colored stool  liver injury (dark yellow or brown urine; general ill feeling or flu-like symptoms; loss of appetite, right upper belly pain; unusually weak or tired, yellowing of the eyes or skin)  low adrenal gland function (nausea; vomiting; loss of appetite; unusually weak or tired; dizziness; low blood pressure)  low blood pressure (dizziness; feeling faint or lightheaded, falls; unusually weak or tired)  redness, blistering, peeling, or loosening of the skin, including inside the mouth  serotonin syndrome (irritable; confusion; diarrhea; fast or irregular heartbeat; muscle twitching; stiff muscles; trouble walking; sweating; high fever; seizures; chills; vomiting)  trouble breathing Side effects that usually do not require medical attention (report to your doctor or health care professional if they continue or are bothersome):  constipation  dry mouth  nausea, vomiting  tiredness This list may not describe all possible side effects. Call your doctor for medical advice about side effects. You may report side effects to FDA at 1-800-FDA-1088. Where should I keep my medicine? Keep out of the reach of children and pets. This medicine can be abused. Keep it in a safe place to protect it from theft. Do not share it with anyone. It is only for you. Selling or giving away this medicine is dangerous and against the law. Store at room temperature between 20 and 25 degrees C (68 and 77 degrees F). Protect from light. Get rid of any unused medicine after the expiration date. This medicine may cause harm and death if it is taken by other adults, children, or pets. It is important  to get rid of the medicine as soon as you no longer need it or it is expired. You can do this in two ways:  Take the medicine to a medicine take-back program. Check with your pharmacy or law enforcement to find a location.  If you cannot return the medicine, flush it down the toilet. NOTE: This sheet is a summary. It may not cover all possible information. If you have questions about this medicine, talk to your doctor, pharmacist, or health care provider.  2021 [employer] Standard (2020-08-31 11:12:15)    Ketorolac Oral Tablets What is this medicine?  KETOROLAC (kee toe ROLE ak) is a non-steroidal anti-inflammatory drug, also known as an NSAID. It treats pain, inflammation, and swelling. This medicine may be used for other purposes; ask your health care provider or pharmacist if you have questions. COMMON BRAND NAME(S): Toradol What should I tell my health care provider before I take this medicine? They need to know if you have any of these conditions:  bleeding disorder  coronary artery bypass graft (CABG) within the past 2 weeks  heart attack  heart disease  heart failure  high blood pressure  if you often drink alcohol  kidney disease  liver disease  lung or breathing disease (asthma)  receiving steroids like dexamethasone or prednisone  smoke tobacco cigarettes  stomach bleeding  stomach ulcers, other stomach or intestine problems  take medicine to treat or prevent blood clots  an unusual or allergic reaction to ketorolac, other medicines, foods, dyes, or preservatives  pregnant or trying to get pregnant  breast-feeding How should I use this medicine? Take this medicine by mouth. Take it as directed on the prescription label at the same time every day. You can take it with or without food. If it upsets your stomach, take it with food. There may be unused or extra doses in the bottle after you finish the dosing cycle. Talk to your health care provider if  you have questions about your dose. A special MedGuide will be given to you by the pharmacist with each prescription and refill. Be sure to read this information carefully each time. Talk to your health care provider about the use of this medicine in children. Special care may be needed. Patients over 80 years of age may have a stronger reaction and need a smaller dose. Overdosage: If you think you have taken too much of this medicine contact a poison control center or emergency room at once. NOTE: This medicine is only for you. Do not share this medicine with others. What if I miss a dose? If you miss a dose, skip it. Take your next dose at the normal time. Do not take extra or 2 doses at the same time to make up for the missed dose. What may interact with this medicine? Do not take this medicine with any of the following medications:  aspirin and aspirin-like medicines  cidofovir  methotrexate  NSAIDs, medicines for pain and inflammation, like ibuprofen or naproxen  pemetrexed  probenecid This medicine may also interact with the following medications:  alcohol  alendronate  alprazolam  carbamazepine  cyclosporine  diuretics  flavocoxid  fluoxetine  ginkgo  lithium  medicines for high blood pressure like enalapril  medicines that affect platelets like pentoxifylline  medicines that treat or prevent blood clots like heparin, warfarin  muscle relaxants  phenytoin  steroid medicines like prednisone or cortisone  thiothixene This list may not describe all possible interactions. Give your health care provider a list of all the medicines, herbs, non-prescription drugs, or dietary supplements you use. Also tell them if you smoke, drink alcohol, or use illegal drugs. Some items may interact with your medicine. What should I watch for while using this medicine? Visit your health care provider for regular checks on your progress. Tell your health care provider if  your symptoms do not start to get better or if they get worse. Do not use this medicine for more than 5 days. It is only used for short-term treatment of moderate to severe pain. The risk of side effects such as kidney  damage and stomach bleeding are higher if used for more than 5 days. Do not take other medicines that contain aspirin, ibuprofen, or naproxen with this medicine. Side effects such as stomach upset, nausea, or ulcers may be more likely to occur. Many non-prescription medicines contain aspirin, ibuprofen, or naproxen. Always read labels carefully. This medicine can cause serious ulcers and bleeding in the stomach. It can happen with no warning. Smoking, drinking alcohol, older age, and poor health can also increase risks. Call your health care provider right away if you have stomach pain or blood in your vomit or stool. This medicine may cause serious skin reactions. They can happen weeks to months after starting the medicine. Contact your health care provider right away if you notice fevers or flu-like symptoms with a rash. The rash may be red or purple and then turn into blisters or peeling of the skin. Or, you might notice a red rash with swelling of the face, lips or lymph nodes in your neck or under your arms. This medicine does not prevent a heart attack or stroke. This medicine may increase the chance of a heart attack or stroke. The chance may increase the longer you use this medicine or if you have heart disease. If you take aspirin to prevent a heart attack or stroke, talk to your health care provider about using this medicine. You may get drowsy or dizzy. Do not drive, use machinery, or do anything that needs mental alertness until you know how this medicine affects you. Do not stand up or sit up quickly, especially if you are an older patient. This reduces the risk of dizzy or fainting spells. What side effects may I notice from receiving this medicine? Side effects that you should  report to your doctor or health care professional as soon as possible:  allergic reactions (skin rash, itching or hives; swelling of the face, lips, or tongue)  bleeding (bloody or black, tarry stools; red or dark brown urine; spitting up blood or brown material that looks like coffee grounds; red spots on the skin; unusual bruising or bleeding from the eyes, gums, or nose)  heart attack (trouble breathing; pain or tightness in the chest, neck, back or arms; unusually weak or tired)  heart failure (trouble breathing; fast, irregular heartbeat; sudden weight gain; swelling of the ankles, feet, hands; unusually weak or tired)  kidney injury (trouble passing urine or change in the amount of urine)  liver injury (dark yellow or brown urine; general ill feeling or flu-like symptoms; loss of appetite, right upper belly pain; unusually weak or tired, yellowing of the eyes or skin)  low red blood cell counts (trouble breathing; feeling faint; lightheaded, falls; unusually weak or tired)  rash, fever, and swollen lymph nodes  redness, blistering, peeling, or loosening of the skin, including inside the mouth  stroke (changes in vision; confusion; trouble speaking or understanding; severe headaches; sudden numbness or weakness of the face, arm or leg; trouble walking; dizziness; loss of balance or coordination) Side effects that usually do not require medical attention (report to your doctor or health care professional if they continue or are bothersome):  constipation  decreased hearing, ringing in the ears  diarrhea  headache  nausea  passing gas  stomach pain  upset stomach This list may not describe all possible side effects. Call your doctor for medical advice about side effects. You may report side effects to FDA at 1-800-FDA-1088. Where should I keep my medicine? Keep out of the  reach of children and pets. Store at room temperature between 15 and 30 degrees C (59 and 86 degrees  F). Protect from moisture. Keep the container tightly closed. Protect from light. Get rid of any unused medicine after the expiration date. To get rid of medicines that are no longer needed or expired:  Take the medicine to a medicine take-back program. Check with your pharmacy or law enforcement to find a location.  If you cannot return the medicine, check the label or package insert to see if the medicine should be thrown out in the garbage or flushed down the toilet. If you are not sure, ask your health care provider. If it is safe to put in the trash, pour the medicine out of the container. Mix the medicine with cat litter, dirt, coffee grounds, or other unwanted substance. Seal the mixture in a bag or container. Put it in the trash. NOTE: This sheet is a summary. It may not cover all possible information. If you have questions about this medicine, talk to your doctor, pharmacist, or health care provider.  2021 [employer]/Gold Standard (2020-04-22 10:29:52)    Ondansetron oral dissolving tablet What is this medicine? ONDANSETRON (on DAN se tron) is used to treat nausea and vomiting caused by chemotherapy. It is also used to prevent or treat nausea and vomiting after surgery. This medicine may be used for other purposes; ask your health care provider or pharmacist if you have questions. COMMON BRAND NAME(S): Zofran ODT What should I tell my health care provider before I take this medicine? They need to know if you have any of these conditions:  heart disease  history of irregular heartbeat  liver disease  low levels of magnesium or potassium in the blood  an unusual or allergic reaction to ondansetron, granisetron, other medicines, foods, dyes, or preservatives  pregnant or trying to get pregnant  breast-feeding How should I use this medicine? These tablets are made to dissolve in the mouth. Do not try to push the tablet through the foil backing. With dry hands, peel away the foil  backing and gently remove the tablet. Place the tablet in the mouth and allow it to dissolve, then swallow. While you may take these tablets with water, it is not necessary to do so. Talk to your pediatrician regarding the use of this medicine in children. Special care may be needed. Overdosage: If you think you have taken too much of this medicine contact a poison control center or emergency room at once. NOTE: This medicine is only for you. Do not share this medicine with others. What if I miss a dose? If you miss a dose, take it as soon as you can. If it is almost time for your next dose, take only that dose. Do not take double or extra doses. What may interact with this medicine? Do not take this medicine with any of the following medications:  apomorphine  certain medicines for fungal infections like fluconazole, itraconazole, ketoconazole, posaconazole, voriconazole  cisapride  dronedarone  pimozide  thioridazine This medicine may also interact with the following medications:  carbamazepine  certain medicines for depression, anxiety, or psychotic disturbances  fentanyl  linezolid  MAOIs like Carbex, Eldepryl, Marplan, Nardil, and Parnate  methylene blue (injected into a vein)  other medicines that prolong the QT interval (cause an abnormal heart rhythm) like dofetilide, ziprasidone  phenytoin  rifampicin  tramadol This list may not describe all possible interactions. Give your health care provider a list of all  the medicines, herbs, non-prescription drugs, or dietary supplements you use. Also tell them if you smoke, drink alcohol, or use illegal drugs. Some items may interact with your medicine. What should I watch for while using this medicine? Check with your doctor or health care professional as soon as you can if you have any sign of an allergic reaction. What side effects may I notice from receiving this medicine? Side effects that you should report to your  doctor or health care professional as soon as possible:  allergic reactions like skin rash, itching or hives, swelling of the face, lips, or tongue  breathing problems  confusion  dizziness  fast or irregular heartbeat  feeling faint or lightheaded, falls  fever and chills  loss of balance or coordination  seizures  sweating  swelling of the hands and feet  tightness in the chest  tremors  unusually weak or tired Side effects that usually do not require medical attention (report to your doctor or health care professional if they continue or are bothersome):  constipation or diarrhea  headache This list may not describe all possible side effects. Call your doctor for medical advice about side effects. You may report side effects to FDA at 1-800-FDA-1088. Where should I keep my medicine? Keep out of the reach of children. Store between 2 and 30 degrees C (36 and 86 degrees F). Throw away any unused medicine after the expiration date. NOTE: This sheet is a summary. It may not cover all possible information. If you have questions about this medicine, talk to your doctor, pharmacist, or health care provider.  2021 [employer]/Gold Standard (2018-11-25 07:14:10)

## 2021-01-11 NOTE — Anesthesia Procedure Notes (Signed)
Procedure Name: Intubation Date/Time: 01/11/2021 8:38 AM Performed by: Vista Deck, CRNA Pre-anesthesia Checklist: Patient identified, Patient being monitored, Timeout performed, Emergency Drugs available and Suction available Patient Re-evaluated:Patient Re-evaluated prior to induction Oxygen Delivery Method: Circle System Utilized Preoxygenation: Pre-oxygenation with 100% oxygen Induction Type: IV induction Laryngoscope Size: Mac and 3 Grade View: Grade I Tube type: Oral Tube size: 7.0 mm Number of attempts: 1 Airway Equipment and Method: stylet and Oral airway Placement Confirmation: ETT inserted through vocal cords under direct vision,  positive ETCO2 and breath sounds checked- equal and bilateral Secured at: 21 cm Tube secured with: Tape Dental Injury: Teeth and Oropharynx as per pre-operative assessment

## 2021-01-11 NOTE — Op Note (Signed)
Preoperative diagnosis: > Endometriosis, presumed                                        >Primary menorrhagia                                        >Primary dysmenorrhea                                        >Secondary dyspareunia                                        >All of the above unresponsive to multiple conservative treatment                                                Modalities  Postoperative diagnosis: Same as above plus Stage IV endometriosis(obliterated posterior cul de sac: [email or web address])  Procedure: Total abdominal hysterectomy with bilateral salpingo-oophorectomy  Surgeon:Thaddius Manes Chriss Driver, MD   Anesthesia: General endotracheal  Findings:  Patient had essentially peritoneal involvement of the entire pelvis as well as the fallopian tubes and ovaries.  There is no definitive endometrioma but endometriosis was back old throughout the surface of the ovaries.  Both pelvic sidewalls were extensively involved and there was an obliterated posterior cul-de-sac automatically making this a stage IV endometriosis. Preoperatively the patient I spoke extensively regarding management in case this indeed was the case and she was comfortable removal of both tubes and ovaries if we felt that that was the most appropriate therapy given the staging of her endometriosis.  He understood if we left the ovaries there probably was a chance that we would have to come back in the ensuing years for laparoscopic removal she was comfortable with either approach.  However with an obliterated cul-de-sac and peritoneal sidewalls been extensively involved with endometriosis and the patient clearly stating that she had no intention of having children, I made the decision she and I talked about preoperatively to remove both tubes and ovaries as well.  She will be kept on oral Prometrium for the next 3 months or until all of her pelvic pain subsides and then begun on combination  oral contraceptive therapy as her estrogen replacement therapy going forward  Description of operation: Patient was taken to the operating room where she was placed in the supine position She underwent general endotracheal anesthesia The vagina was prepped and a Foley catheter was placed The abdomen was prepped and draped in the usual sterile fashion A 6 cm mini lap incision was made 2 cm above the pubic bone in the midline This incision was carried down sharply to the rectus fascia which was scored in midline The fascia was taken off the muscle superiorly and inferiorly without difficulty Muscles were divided and the peritoneal cavity was entered The upper abdomen was packed away A small Alexis self-retaining retractor was placed The above-noted findings were seen the extensive peritoneal endometriosis also involving the surface of the ovary and fallopian  tubes The right uterine cornu was grasped and the right round ligament was suture ligated and cut The left uterine cornu was grasped and the left round ligament was suture ligated and cut The broad ligament peritoneum was divided and the bladder was taken off the lower uterine segment bilaterally and in the midline without difficulty There was certainly endometriosis involving the peritoneum of the bladder as well Serial pedicles were taken down the fundus of the uterus Each pedicle was clamped cut and transfixion suture ligated The uterine vessels were o'clock crossclamped bilaterally and transfixion suture ligated with good hemostasis The posterior cul-de-sac was obliterated by endometriosis and the rectosigmoid was dissected off of the posterior peritoneum between the uterosacral ligaments to allow a window medially to effect removal of the cervix safely The rectosigmoid was uninjured during the surgery and was completely freed from the elevated floor Serial pedicles were taken medial to the uterine vessels pedicle down the cervix Each 1  of these pedicles was clamped cut and transfixion suture ligated The vagina was then crossclamped on the left vaginal angle suture was placed The vagina was then crossclamped and the right and a right vaginal angle suture was also placed Specimen was removed intact The vagina was closed with interrupted figure-of-eight sutures for closure and hemostasis Made the decision based on stage IV [employer] endometriosis staging with an obliterated cul-de-sac per the preoperative conversations I had with the patient to remove both tubes and ovaries The infundibulopelvic ligaments were crossclamped bilaterally Double fore and aft sutures were placed bilaterally with good hemostasis The pelvis was irrigated vigorously All pedicles were found to be hemostatic although certainly pelvic sidewall peritoneum was extensively involved and somewhat damaged by doing the surgery While it was hemostatic I did place Arista for additional hemostasis support 3 g total was used The packs and self-retaining retractor were removed The muscles and peritoneum reapproximated loosely The fascia was closed using 0 Vicryl running Subcutaneous tissue was made hemostatic and irrigated 266 mg of Exparel and 40 cc total volume was injected into the subcutaneous tissue down to the level of the fascia to augment postoperative pain management The skin was closed using 3-0 Vicryl and a Keith needle in a subcuticular fashion Herbon was placed over the closure for additional wound integrity and to serve as a postoperative bandage Honeycomb dressing was placed over the incision The patient was taken to the recovery room in good stable condition All sponge needle instrument counts were correct x3 Blood loss for the procedure was 200 cc Patient received 2 g of Ancef and 30 mg of Toradol preoperatively prophylactically  Per our preoperative plan the patient is waiting to be discharged from the PACU To undergo my  postoperative pain management algorithm in order to facilitate that  Florian Buff, MD 01/11/2021 10:44 AM

## 2021-01-11 NOTE — Anesthesia Preprocedure Evaluation (Signed)
Anesthesia Evaluation  Patient identified by MRN, date of birth, ID band Patient awake    Reviewed: Allergy & Precautions, H&P , NPO status , Patient's Chart, lab work & pertinent test results, reviewed documented beta blocker date and time   Airway Mallampati: II  TM Distance: >3 FB Neck ROM: full    Dental no notable dental hx.    Pulmonary asthma , Current Smoker,    Pulmonary exam normal breath sounds clear to auscultation       Cardiovascular Exercise Tolerance: Good hypertension, negative cardio ROS   Rhythm:regular Rate:Normal     Neuro/Psych  Headaches, negative psych ROS   GI/Hepatic negative GI ROS, Neg liver ROS,   Endo/Other  Morbid obesity  Renal/GU negative Renal ROS  negative genitourinary   Musculoskeletal   Abdominal   Peds  Hematology negative hematology ROS (+)   Anesthesia Other Findings   Reproductive/Obstetrics negative OB ROS                             Anesthesia Physical Anesthesia Plan  ASA: II  Anesthesia Plan: General   Post-op Pain Management:    Induction:   PONV Risk Score and Plan: Ondansetron  Airway Management Planned:   Additional Equipment:   Intra-op Plan:   Post-operative Plan:   Informed Consent: I have reviewed the patients History and Physical, chart, labs and discussed the procedure including the risks, benefits and alternatives for the proposed anesthesia with the patient or authorized representative who has indicated his/her understanding and acceptance.     Dental Advisory Given  Plan Discussed with: CRNA  Anesthesia Plan Comments:         Anesthesia Quick Evaluation

## 2021-01-11 NOTE — Transfer of Care (Signed)
Immediate Anesthesia Transfer of Care Note  Patient: Barbara Lam  Procedure(s) Performed: HYSTERECTOMY ABDOMINAL (N/A ) OPEN SALPINGO OOPHORECTOMY (Bilateral )  Patient Location: PACU  Anesthesia Type:General  Level of Consciousness: awake and patient cooperative  Airway & Oxygen Therapy: Patient Spontanous Breathing and Patient connected to nasal cannula oxygen  Post-op Assessment: Report given to RN and Post -op Vital signs reviewed and stable  Post vital signs: Reviewed and stable  Last Vitals:  Vitals Value Taken Time  BP 126/86 01/11/21 1030  Temp 98.6   Pulse 94 01/11/21 1031  Resp 21 01/11/21 1031  SpO2 99 % 01/11/21 1031  Vitals shown include unvalidated device data.  Last Pain:  Vitals:   01/11/21 0732  TempSrc: Oral  PainSc: 7       Patients Stated Pain Goal: 10 (42/35/36 1443)  Complications: No complications documented.

## 2021-01-13 ENCOUNTER — Encounter (HOSPITAL_COMMUNITY): Payer: Self-pay | Admitting: Obstetrics & Gynecology

## 2021-01-13 LAB — SURGICAL PATHOLOGY

## 2021-01-23 ENCOUNTER — Other Ambulatory Visit: Payer: Self-pay

## 2021-01-23 ENCOUNTER — Ambulatory Visit (INDEPENDENT_AMBULATORY_CARE_PROVIDER_SITE_OTHER): Payer: Managed Care, Other (non HMO) | Admitting: Obstetrics & Gynecology

## 2021-01-23 ENCOUNTER — Encounter: Payer: Self-pay | Admitting: Obstetrics & Gynecology

## 2021-01-23 VITALS — BP 136/98 | HR 107 | Wt 176.0 lb

## 2021-01-23 DIAGNOSIS — Z90722 Acquired absence of ovaries, bilateral: Secondary | ICD-10-CM

## 2021-01-23 DIAGNOSIS — Z9079 Acquired absence of other genital organ(s): Secondary | ICD-10-CM

## 2021-01-23 DIAGNOSIS — Z90710 Acquired absence of both cervix and uterus: Secondary | ICD-10-CM

## 2021-01-23 MED ORDER — PROGESTERONE 200 MG PO CAPS: 200.0000 mg | ORAL_CAPSULE | Freq: Every day | ORAL | 11 refills | Status: DC

## 2021-01-23 NOTE — Progress Notes (Signed)
  HPI: Patient returns for routine postoperative follow-up having undergone TAH BSO on 01/11/21.  The patient's immediate postoperative recovery has been unremarkable. Since hospital discharge the patient reports no complaints.   Current Outpatient Medications: albuterol (VENTOLIN HFA) 108 (90 Base) MCG/ACT inhaler, Inhale 1-2 puffs into the lungs every 6 (six) hours as needed for wheezing or shortness of breath., Disp: 1 Inhaler, Rfl: 0 ALLERGY RELIEF 10 MG tablet, Take 10 mg by mouth daily., Disp: , Rfl:  amLODipine (NORVASC) 5 MG tablet, Take 5 mg by mouth daily., Disp: , Rfl:  benzonatate (TESSALON) 100 MG capsule, Take 1 capsule (100 mg total) by mouth every 8 (eight) hours., Disp: 30 capsule, Rfl: 0 cetirizine (ZYRTEC ALLERGY) 10 MG tablet, Take 1 tablet (10 mg total) by mouth daily., Disp: 30 tablet, Rfl: 0 gabapentin (NEURONTIN) 300 MG capsule, Take 1 capsule (300 mg total) by mouth 3 (three) times daily., Disp: 12 capsule, Rfl: 0 ibuprofen (ADVIL) 800 MG tablet, Take 1 tablet (800 mg total) by mouth every 8 (eight) hours as needed. After finishing the toradol, Disp: 30 tablet, Rfl: 0 ketorolac (TORADOL) 10 MG tablet, Take 1 tablet (10 mg total) by mouth every 8 (eight) hours as needed., Disp: 15 tablet, Rfl: 0 montelukast (SINGULAIR) 10 MG tablet, Take 10 mg by mouth daily., Disp: , Rfl:  ondansetron (ZOFRAN ODT) 8 MG disintegrating tablet, Take 1 tablet (8 mg total) by mouth every 8 (eight) hours as needed for nausea or vomiting., Disp: 20 tablet, Rfl: 0 oxyCODONE-acetaminophen (PERCOCET) 7.5-325 MG tablet, Take 1-2 tablets by mouth every 6 (six) hours as needed., Disp: 30 tablet, Rfl: 0 progesterone (PROMETRIUM) 200 MG capsule, Take 1 capsule (200 mg total) by mouth daily. At bedtime, Disp: 30 capsule, Rfl: 11 fluticasone (FLONASE) 50 MCG/ACT nasal spray, Place 1 spray into both nostrils daily for 14 days., Disp: 16 g, Rfl: 0  No current facility-administered medications for this  visit.    Blood pressure (!) 136/98, pulse (!) 107, weight 176 lb (79.8 kg), last menstrual period 12/07/2020.  Physical Exam: Normal exam of abdomen  Incision clean dry intact  Diagnostic Tests:   Pathology: endometriosis  Impression:   ICD-10-CM   1. S/P TAH-BSO  Z90.710    Z90.722    Z90.79       Plan: Orders Placed This Encounter     progesterone (PROMETRIUM) 200 MG capsule         Sig: Take 1 capsule (200 mg total) by mouth daily. At bedtime         Dispense:  30 capsule         Refill:  11    Follow up: 4 weeks   Florian Buff, MD

## 2021-02-20 ENCOUNTER — Other Ambulatory Visit: Payer: Self-pay

## 2021-02-20 ENCOUNTER — Ambulatory Visit (INDEPENDENT_AMBULATORY_CARE_PROVIDER_SITE_OTHER): Payer: Managed Care, Other (non HMO) | Admitting: Obstetrics & Gynecology

## 2021-02-20 ENCOUNTER — Encounter: Payer: Self-pay | Admitting: Obstetrics & Gynecology

## 2021-02-20 VITALS — BP 118/84 | HR 113 | Ht 66.0 in | Wt 177.0 lb

## 2021-02-20 DIAGNOSIS — Z90722 Acquired absence of ovaries, bilateral: Secondary | ICD-10-CM

## 2021-02-20 DIAGNOSIS — Z90710 Acquired absence of both cervix and uterus: Secondary | ICD-10-CM

## 2021-02-20 DIAGNOSIS — F320 Major depressive disorder, single episode, mild: Secondary | ICD-10-CM

## 2021-02-20 DIAGNOSIS — Z9079 Acquired absence of other genital organ(s): Secondary | ICD-10-CM

## 2021-02-20 MED ORDER — ESCITALOPRAM OXALATE 20 MG PO TABS: 20.0000 mg | ORAL_TABLET | Freq: Every day | ORAL | 3 refills | Status: DC

## 2021-02-20 NOTE — Progress Notes (Signed)
  HPI: Patient returns for routine postoperative follow-up having undergone TAH BSO on 01/11/21.  The patient's immediate postoperative recovery has been unremarkable. Since hospital discharge the patient reports feeling depressed, no thoughts of harming herself.   Current Outpatient Medications: albuterol (VENTOLIN HFA) 108 (90 Base) MCG/ACT inhaler, Inhale 1-2 puffs into the lungs every 6 (six) hours as needed for wheezing or shortness of breath., Disp: 1 Inhaler, Rfl: 0 ALLERGY RELIEF 10 MG tablet, Take 10 mg by mouth daily., Disp: , Rfl:  amLODipine (NORVASC) 5 MG tablet, Take 5 mg by mouth daily., Disp: , Rfl:  benzonatate (TESSALON) 100 MG capsule, Take 1 capsule (100 mg total) by mouth every 8 (eight) hours., Disp: 30 capsule, Rfl: 0 cetirizine (ZYRTEC ALLERGY) 10 MG tablet, Take 1 tablet (10 mg total) by mouth daily., Disp: 30 tablet, Rfl: 0 escitalopram (LEXAPRO) 20 MG tablet, Take 1 tablet (20 mg total) by mouth daily., Disp: 30 tablet, Rfl: 3 gabapentin (NEURONTIN) 300 MG capsule, Take 1 capsule (300 mg total) by mouth 3 (three) times daily., Disp: 12 capsule, Rfl: 0 montelukast (SINGULAIR) 10 MG tablet, Take 10 mg by mouth daily., Disp: , Rfl:  ondansetron (ZOFRAN ODT) 8 MG disintegrating tablet, Take 1 tablet (8 mg total) by mouth every 8 (eight) hours as needed for nausea or vomiting., Disp: 20 tablet, Rfl: 0 oxyCODONE-acetaminophen (PERCOCET) 7.5-325 MG tablet, Take 1-2 tablets by mouth every 6 (six) hours as needed., Disp: 30 tablet, Rfl: 0 progesterone (PROMETRIUM) 200 MG capsule, Take 1 capsule (200 mg total) by mouth daily. At bedtime, Disp: 30 capsule, Rfl: 11 fluticasone (FLONASE) 50 MCG/ACT nasal spray, Place 1 spray into both nostrils daily for 14 days., Disp: 16 g, Rfl: 0  No current facility-administered medications for this visit.    Blood pressure 118/84, pulse (!) 113, height 5\' 6"  (1.676 m), weight 177 lb (80.3 kg), last menstrual period 12/07/2020.  Physical  Exam: Incision clean dry intact Abdomen is soft non tender Vaginal cuff healing well  Diagnostic Tests:   Pathology: Endometriosis, AFS IV  Impression:   ICD-10-CM   1. S/P TAH-BSO  Z90.710    Z90.722    Z90.79   2. Current mild episode of major depressive disorder without prior episode ([place])  F32.0       Plan: Orders Placed This Encounter     escitalopram (LEXAPRO) 20 MG tablet         Sig: Take 1 tablet (20 mg total) by mouth daily.         Dispense:  30 tablet         Refill:  3    Follow up: Follow up 3 months to reassess HRT   Florian Buff, MD

## 2021-04-21 IMAGING — DX DG KNEE COMPLETE 4+V*L*
4 series · 4 of 4 positions shown · non-contrast
Comparison: None.

CLINICAL DATA: Right knee pain since the patient slipped and fell
on ice today. Initial encounter.

EXAM:
LEFT KNEE - COMPLETE 4+ VIEW

[knee ap]
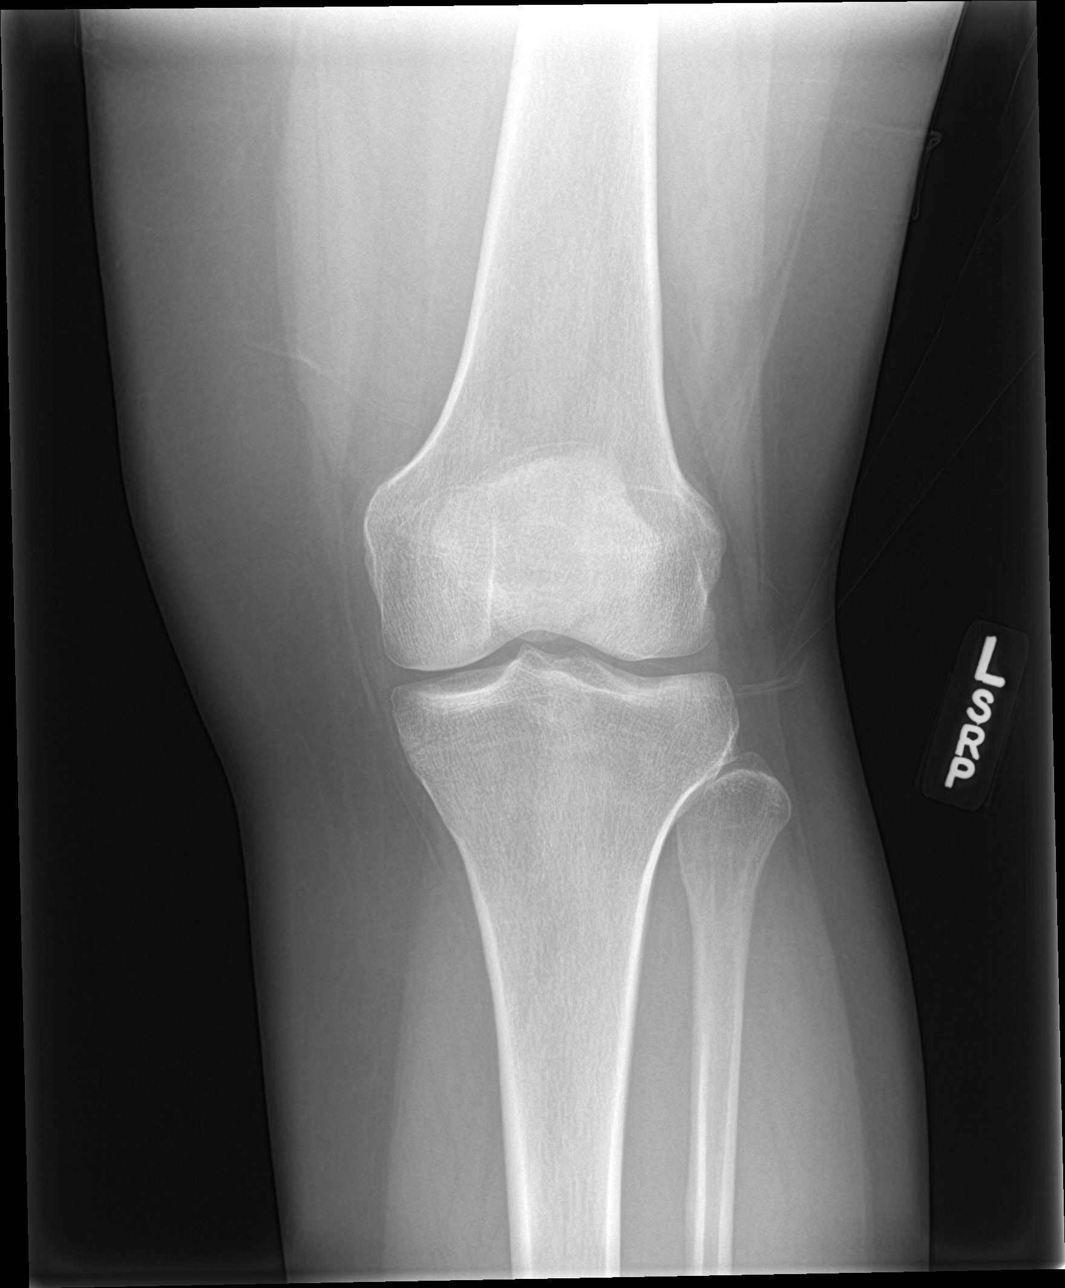

[knee obl (1 of 2)]
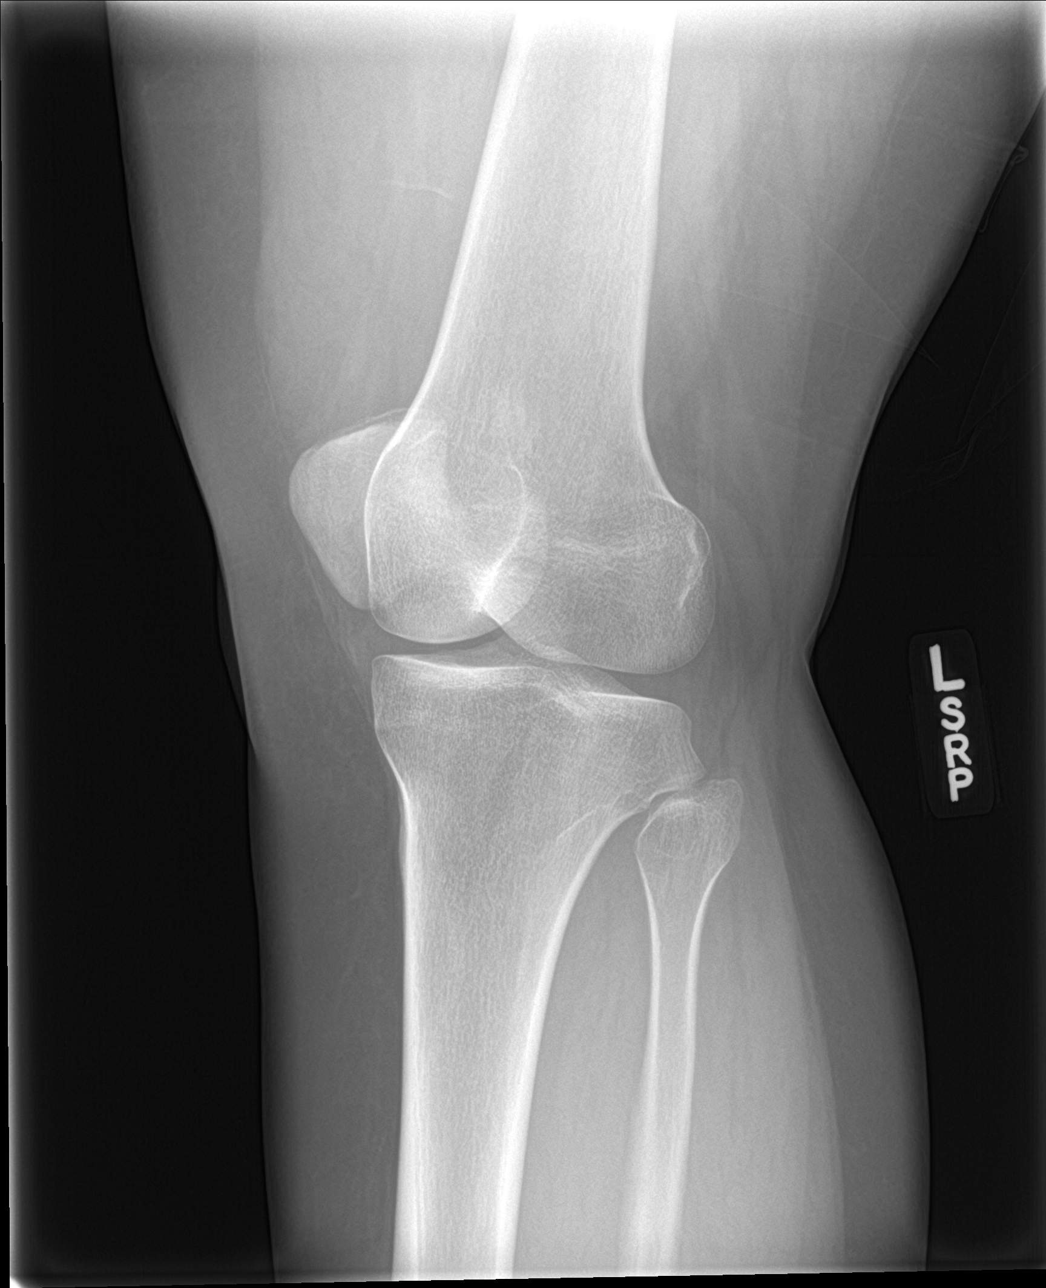

[knee obl (2 of 2)]
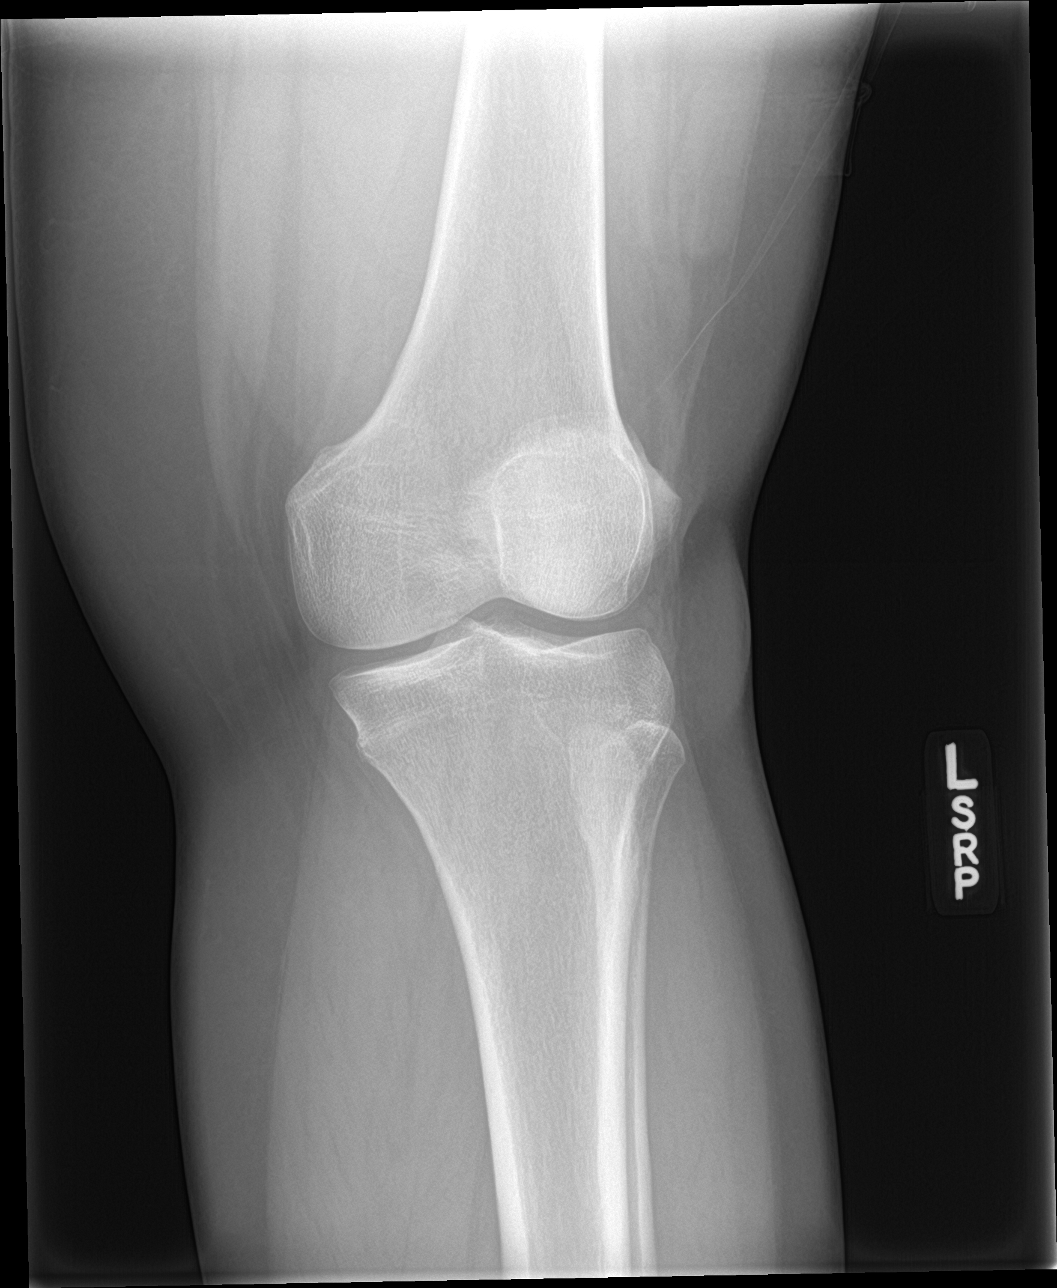

[knee lat]
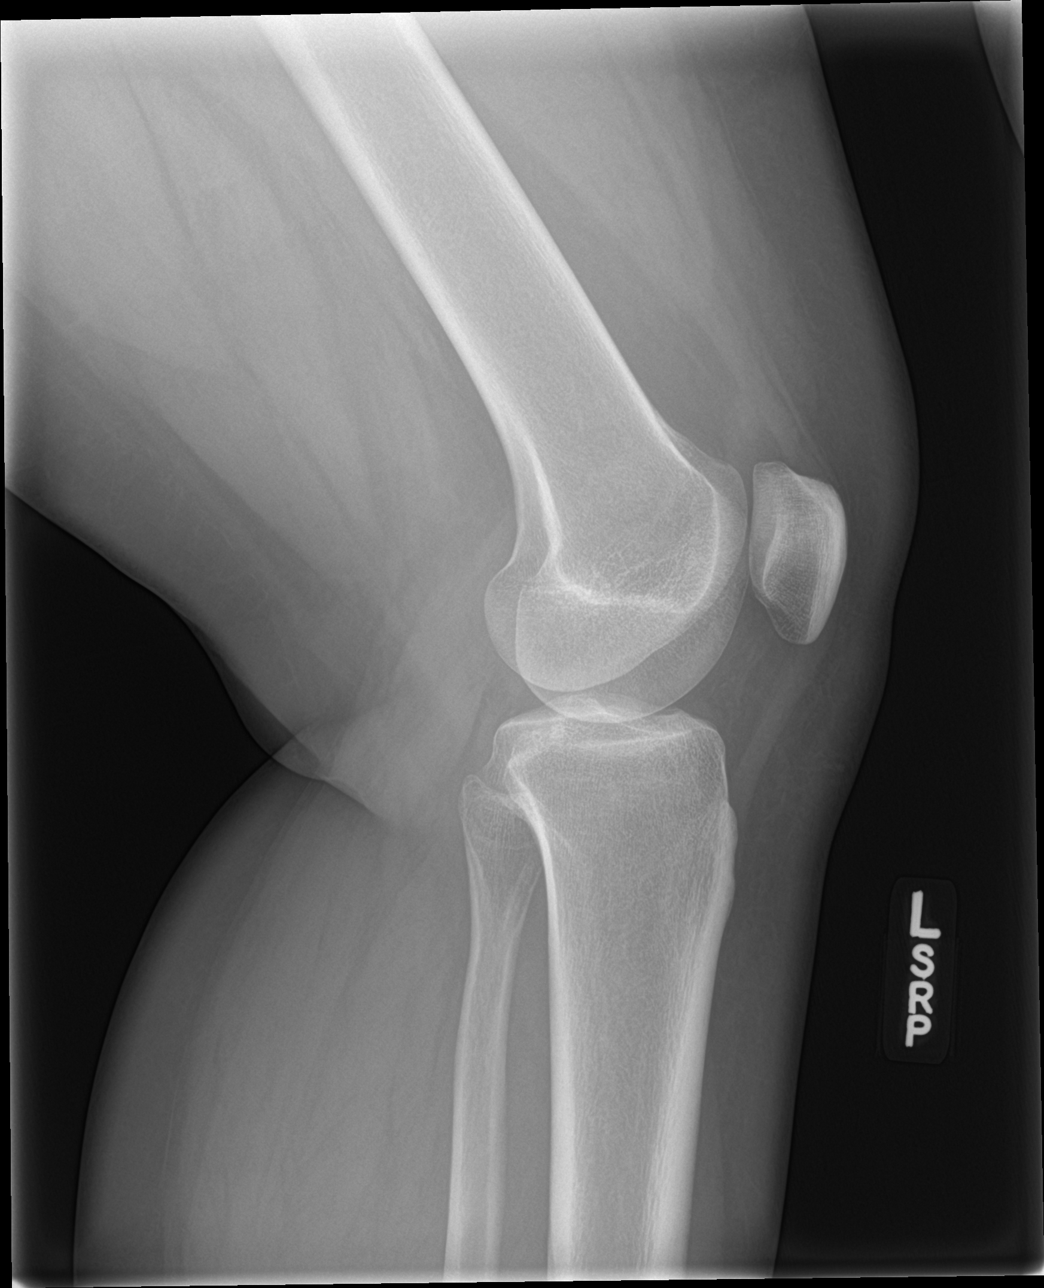

[4 of 4 positions shown; findings below may reference images not displayed]

FINDINGS: No evidence of fracture, dislocation, or joint effusion. No evidence
of arthropathy or other focal bone abnormality. Soft tissues are
unremarkable.
IMPRESSION: Normal exam.

## 2021-05-23 ENCOUNTER — Ambulatory Visit: Payer: Managed Care, Other (non HMO) | Admitting: Obstetrics & Gynecology

## 2021-06-05 ENCOUNTER — Ambulatory Visit: Payer: Managed Care, Other (non HMO) | Admitting: Obstetrics & Gynecology

## 2021-08-07 ENCOUNTER — Encounter: Payer: Self-pay | Admitting: Obstetrics & Gynecology

## 2021-08-07 ENCOUNTER — Ambulatory Visit: Payer: Self-pay | Admitting: Obstetrics & Gynecology

## 2021-08-07 ENCOUNTER — Other Ambulatory Visit: Payer: Self-pay

## 2021-08-07 VITALS — BP 117/87 | HR 106 | Ht 60.0 in | Wt 175.0 lb

## 2021-08-07 DIAGNOSIS — Z9079 Acquired absence of other genital organ(s): Secondary | ICD-10-CM

## 2021-08-07 DIAGNOSIS — F320 Major depressive disorder, single episode, mild: Secondary | ICD-10-CM

## 2021-08-07 DIAGNOSIS — Z90722 Acquired absence of ovaries, bilateral: Secondary | ICD-10-CM

## 2021-08-07 DIAGNOSIS — N809 Endometriosis, unspecified: Secondary | ICD-10-CM

## 2021-08-07 DIAGNOSIS — Z90710 Acquired absence of both cervix and uterus: Secondary | ICD-10-CM

## 2021-08-07 MED ORDER — DESOGESTREL-ETHINYL ESTRADIOL 0.15-0.02/0.01 MG (21/5) PO TABS: 1.0000 | ORAL_TABLET | Freq: Every day | ORAL | 4 refills | Status: DC

## 2021-08-07 MED ORDER — ESCITALOPRAM OXALATE 20 MG PO TABS: 20.0000 mg | ORAL_TABLET | Freq: Every day | ORAL | 11 refills | Status: DC

## 2021-08-07 NOTE — Progress Notes (Signed)
Chief Complaint  Patient presents with   Follow-up      30 y.o. G0P0000 Patient's last menstrual period was 12/07/2020. The current method of family planning is status post hysterectomy.  Outpatient Encounter Medications as of 08/07/2021  Medication Sig   albuterol (VENTOLIN HFA) 108 (90 Base) MCG/ACT inhaler Inhale 1-2 puffs into the lungs every 6 (six) hours as needed for wheezing or shortness of breath.   ALLERGY RELIEF 10 MG tablet Take 10 mg by mouth daily.   amLODipine (NORVASC) 5 MG tablet Take 5 mg by mouth daily.   cetirizine (ZYRTEC ALLERGY) 10 MG tablet Take 1 tablet (10 mg total) by mouth daily.   desogestrel-ethinyl estradiol (MIRCETTE) 0.15-0.02/0.01 MG (21/5) tablet Take 1 tablet by mouth daily.   escitalopram (LEXAPRO) 20 MG tablet Take 1 tablet (20 mg total) by mouth daily.   escitalopram (LEXAPRO) 20 MG tablet Take 1 tablet (20 mg total) by mouth daily.   montelukast (SINGULAIR) 10 MG tablet Take 10 mg by mouth daily.   ondansetron (ZOFRAN ODT) 8 MG disintegrating tablet Take 1 tablet (8 mg total) by mouth every 8 (eight) hours as needed for nausea or vomiting.   progesterone (PROMETRIUM) 200 MG capsule Take 1 capsule (200 mg total) by mouth daily. At bedtime   [DISCONTINUED] benzonatate (TESSALON) 100 MG capsule Take 1 capsule (100 mg total) by mouth every 8 (eight) hours.   [DISCONTINUED] fluticasone (FLONASE) 50 MCG/ACT nasal spray Place 1 spray into both nostrils daily for 14 days.   [DISCONTINUED] gabapentin (NEURONTIN) 300 MG capsule Take 1 capsule (300 mg total) by mouth 3 (three) times daily.   [DISCONTINUED] oxyCODONE-acetaminophen (PERCOCET) 7.5-325 MG tablet Take 1-2 tablets by mouth every 6 (six) hours as needed.   No facility-administered encounter medications on file as of 08/07/2021.    Subjective Pt in for follow of her post surgical endometriosis therapy and depression therapy She has lost her MGM recently She is doing well on the lexapro and  wants to continue feels much more like herself No pelvic pain  Will transition to [facility] for ongoing HRT Past Medical History:  Diagnosis Date   Asthma    Chronic headaches    Hypertension     Past Surgical History:  Procedure Laterality Date   ABDOMINAL HYSTERECTOMY N/A 01/11/2021   Procedure: HYSTERECTOMY ABDOMINAL;  Surgeon: Florian Buff, MD;  Location: [facility];  Service: Gynecology;  Laterality: N/A;   EYE SURGERY     SALPINGOOPHORECTOMY Bilateral 01/11/2021   Procedure: OPEN SALPINGO OOPHORECTOMY;  Surgeon: Florian Buff, MD;  Location: [facility];  Service: Gynecology;  Laterality: Bilateral;    OB History     Gravida  0   Para  0   Term  0   Preterm  0   AB  0   Living  0      SAB  0   IAB  0   Ectopic  0   Multiple  0   Live Births  0           Allergies  Allergen Reactions   Lisinopril    Tomato Hives    Social History   Socioeconomic History   Marital status: Single    Spouse name: Not on file   Number of children: Not on file   Years of education: Not on file   Highest education level: Not on file  Occupational History   Not on file  Tobacco Use   Smoking  status: Some Days    Types: Cigars   Smokeless tobacco: Never  Vaping Use   Vaping Use: Never used  Substance and Sexual Activity   Alcohol use: Yes    Comment: Socially    Drug use: No   Sexual activity: Not Currently    Birth control/protection: Surgical    Comment: hyst  Other Topics Concern   Not on file  Social History Narrative   Not on file   Social Determinants of Health   Financial Resource Strain: Not on file  Food Insecurity: Not on file  Transportation Needs: Not on file  Physical Activity: Not on file  Stress: Not on file  Social Connections: Not on file    Family History  Problem Relation Age of Onset   COPD Paternal Grandmother    COPD Maternal Grandmother    Congestive Heart Failure Maternal Grandmother    Congestive Heart Failure Maternal  Grandfather    COPD Maternal Grandfather    Healthy Father    Other Father        motorcycle accident   Asthma Mother    Emphysema Mother    Berenice Primas' disease Mother    Asthma Brother    Asthma Sister    Asthma Sister    Asthma Sister    Asthma Sister    Asthma Daughter     Medications:       Current Outpatient Medications:    albuterol (VENTOLIN HFA) 108 (90 Base) MCG/ACT inhaler, Inhale 1-2 puffs into the lungs every 6 (six) hours as needed for wheezing or shortness of breath., Disp: 1 Inhaler, Rfl: 0   ALLERGY RELIEF 10 MG tablet, Take 10 mg by mouth daily., Disp: , Rfl:    amLODipine (NORVASC) 5 MG tablet, Take 5 mg by mouth daily., Disp: , Rfl:    cetirizine (ZYRTEC ALLERGY) 10 MG tablet, Take 1 tablet (10 mg total) by mouth daily., Disp: 30 tablet, Rfl: 0   desogestrel-ethinyl estradiol (MIRCETTE) 0.15-0.02/0.01 MG (21/5) tablet, Take 1 tablet by mouth daily., Disp: 84 tablet, Rfl: 4   escitalopram (LEXAPRO) 20 MG tablet, Take 1 tablet (20 mg total) by mouth daily., Disp: 30 tablet, Rfl: 3   escitalopram (LEXAPRO) 20 MG tablet, Take 1 tablet (20 mg total) by mouth daily., Disp: 30 tablet, Rfl: 11   montelukast (SINGULAIR) 10 MG tablet, Take 10 mg by mouth daily., Disp: , Rfl:    ondansetron (ZOFRAN ODT) 8 MG disintegrating tablet, Take 1 tablet (8 mg total) by mouth every 8 (eight) hours as needed for nausea or vomiting., Disp: 20 tablet, Rfl: 0   progesterone (PROMETRIUM) 200 MG capsule, Take 1 capsule (200 mg total) by mouth daily. At bedtime, Disp: 30 capsule, Rfl: 11  Objective Blood pressure 117/87, pulse (!) 106, height 5' (1.524 m), weight 175 lb (79.4 kg), last menstrual period 12/07/2020.  Gen WDWN NAD  Pertinent ROS No burning with urination, frequency or urgency No nausea, vomiting or diarrhea Nor fever chills or other constitutional symptoms   Labs or studies     Impression Diagnoses this Encounter::   ICD-10-CM   1. Current mild episode of major  depressive disorder, unspecified whether recurrent ([facility])  F32.0     2. S/P TAH-BSO  Z90.710    Z90.722    Z90.79     3. Endometriosis  N80.9       Established relevant diagnosis(es):   Plan/Recommendations: Meds ordered this encounter  Medications   desogestrel-ethinyl estradiol (MIRCETTE) 0.15-0.02/0.01 MG (21/5) tablet  Sig: Take 1 tablet by mouth daily.    Dispense:  84 tablet    Refill:  4    Patient to take continuously no off week, to be refilled every 9 weeks   escitalopram (LEXAPRO) 20 MG tablet    Sig: Take 1 tablet (20 mg total) by mouth daily.    Dispense:  30 tablet    Refill:  11    Labs or Scans Ordered: No orders of the defined types were placed in this encounter.   Management:: As above  Follow up Return in about 6 months (around 02/07/2022) for Follow up, with Dr Elonda Husky.        All questions were answered.

## 2022-01-14 ENCOUNTER — Encounter (HOSPITAL_COMMUNITY): Payer: Self-pay

## 2022-01-14 ENCOUNTER — Other Ambulatory Visit: Payer: Self-pay

## 2022-01-14 ENCOUNTER — Emergency Department (HOSPITAL_COMMUNITY)
Admission: EM | Admit: 2022-01-14 | Discharge: 2022-01-14 | Disposition: A | Discharge status: Home / Self Care | Payer: Self-pay | Attending: Emergency Medicine | Admitting: Emergency Medicine

## 2022-01-14 ENCOUNTER — Emergency Department (HOSPITAL_COMMUNITY): Payer: Self-pay

## 2022-01-14 DIAGNOSIS — R0789 Other chest pain: Secondary | ICD-10-CM | POA: Diagnosis present

## 2022-01-14 DIAGNOSIS — U071 COVID-19: Secondary | ICD-10-CM | POA: Diagnosis not present

## 2022-01-14 DIAGNOSIS — R200 Anesthesia of skin: Secondary | ICD-10-CM | POA: Diagnosis not present

## 2022-01-14 DIAGNOSIS — R202 Paresthesia of skin: Secondary | ICD-10-CM | POA: Insufficient documentation

## 2022-01-14 DIAGNOSIS — R079 Chest pain, unspecified: Secondary | ICD-10-CM

## 2022-01-14 DIAGNOSIS — R000 Tachycardia, unspecified: Secondary | ICD-10-CM | POA: Insufficient documentation

## 2022-01-14 DIAGNOSIS — Z79899 Other long term (current) drug therapy: Secondary | ICD-10-CM | POA: Insufficient documentation

## 2022-01-14 DIAGNOSIS — N9489 Other specified conditions associated with female genital organs and menstrual cycle: Secondary | ICD-10-CM | POA: Insufficient documentation

## 2022-01-14 LAB — BASIC METABOLIC PANEL
Anion gap: 7 (ref 5–15)
BUN: 11 mg/dL (ref 6–20)
CO2: 22 mmol/L (ref 22–32)
Calcium: 8.9 mg/dL (ref 8.9–10.3)
Chloride: 105 mmol/L (ref 98–111)
Creatinine, Ser: 0.96 mg/dL (ref 0.44–1.00)
GFR, Estimated: >60 mL/min (ref >60)
Glucose, Bld: 80 mg/dL (ref 70–99)
Potassium: 3.8 mmol/L (ref 3.5–5.1)
Sodium: 134 mmol/L — ABNORMAL LOW (ref 135–145)

## 2022-01-14 LAB — CBC
HCT: 39.7 % (ref 36.0–46.0)
Hemoglobin: 13.4 g/dL (ref 12.0–15.0)
MCH: 32.1 pg (ref 26.0–34.0)
MCHC: 33.8 g/dL (ref 30.0–36.0)
MCV: 95 fL (ref 80.0–100.0)
Platelets: 186 K/uL (ref 150–400)
RBC: 4.18 MIL/uL (ref 3.87–5.11)
RDW: 13.4 % (ref 11.5–15.5)
WBC: 4 K/uL (ref 4.0–10.5)
nRBC: 0 % (ref 0.0–0.2)

## 2022-01-14 LAB — RESP PANEL BY RT-PCR (FLU A&B, COVID) ARPGX2
Influenza A by PCR: NEGATIVE
Influenza B by PCR: NEGATIVE
SARS Coronavirus 2 by RT PCR: POSITIVE — AB

## 2022-01-14 LAB — HCG, SERUM, QUALITATIVE: Preg, Serum: NEGATIVE

## 2022-01-14 LAB — TROPONIN I (HIGH SENSITIVITY): Troponin I (High Sensitivity): <2 ng/L (ref <18)

## 2022-01-14 LAB — D-DIMER, QUANTITATIVE: D-Dimer, Quant: 0.46 ug/mL-FEU (ref 0.00–0.50)

## 2022-01-14 MED ORDER — HYDROMORPHONE HCL 1 MG/ML IJ SOLN
1.0000 mg | Freq: Once | INTRAMUSCULAR | Status: AC
  Administered 2022-01-14: 1 mg via INTRAVENOUS
  Filled 2022-01-14: qty 1

## 2022-01-14 MED ORDER — DIPHENHYDRAMINE HCL 50 MG/ML IJ SOLN
25.0000 mg | Freq: Once | INTRAMUSCULAR | Status: AC
  Administered 2022-01-14: 25 mg via INTRAVENOUS
  Filled 2022-01-14: qty 1

## 2022-01-14 MED ORDER — ACETAMINOPHEN 325 MG PO TABS
650.0000 mg | ORAL_TABLET | Freq: Once | ORAL | Status: AC
  Administered 2022-01-14: 650 mg via ORAL
  Filled 2022-01-14: qty 2

## 2022-01-14 MED ORDER — PROCHLORPERAZINE EDISYLATE 10 MG/2ML IJ SOLN
10.0000 mg | Freq: Once | INTRAMUSCULAR | Status: AC
  Administered 2022-01-14: 10 mg via INTRAVENOUS
  Filled 2022-01-14: qty 2

## 2022-01-14 MED ORDER — ONDANSETRON 4 MG PO TBDP: 4.0000 mg | ORAL_TABLET | Freq: Three times a day (TID) | ORAL | 0 refills | Status: DC | PRN

## 2022-01-14 MED ORDER — SODIUM CHLORIDE 0.9 % IV BOLUS
1000.0000 mL | Freq: Once | INTRAVENOUS | Status: AC
Start: 2022-01-14 — End: 2022-01-14
  Administered 2022-01-14: 1000 mL via INTRAVENOUS

## 2022-01-14 MED ORDER — KETOROLAC TROMETHAMINE 15 MG/ML IJ SOLN
15.0000 mg | Freq: Once | INTRAMUSCULAR | Status: AC
  Administered 2022-01-14: 15 mg via INTRAVENOUS
  Filled 2022-01-14: qty 1

## 2022-01-14 NOTE — ED Notes (Signed)
XR in room 

## 2022-01-14 NOTE — ED Triage Notes (Signed)
Pt c/o chest pain and left sided weakness that started approx. 3 days ago.

## 2022-01-14 NOTE — Discharge Instructions (Addendum)
If you develop recurrent, continued, or worsening chest pain, shortness of breath, fever, vomiting, abdominal or back pain, weakness or numbness, or any other new/concerning symptoms then return to the ER for evaluation.

## 2022-01-14 NOTE — ED Provider Notes (Signed)
[facility] EMERGENCY DEPARTMENT Provider Note   CSN: [id number] Arrival date & time: 01/14/22  2007     History  Chief Complaint  Patient presents with   Chest Pain   Weakness    Barbara Lam is a 31 y.o. female.  HPI 31 year old female presents with chest pain since 1/24 or 1/25.  Overall patient is a vague historian which limits the history somewhat.  She states that she has been having sharp pain in the middle of her chest that is constant and worse with any type of movement including breathing.  She has not take anything for it.  She rates the pain as severe.  She also feeling tingling and numbness in her whole left side of her body.  When asked, she states she also has a headache this been since the same amount of time as well as body aches.  No fevers to her knowledge.  Some cough.  Home Medications Prior to Admission medications   Medication Sig Start Date End Date Taking? Authorizing Provider  ondansetron (ZOFRAN-ODT) 4 MG disintegrating tablet Take 1 tablet (4 mg total) by mouth every 8 (eight) hours as needed for nausea or vomiting. 01/14/22  Yes Sherwood Gambler, MD  albuterol (VENTOLIN HFA) 108 (90 Base) MCG/ACT inhaler Inhale 1-2 puffs into the lungs every 6 (six) hours as needed for wheezing or shortness of breath. 04/04/19   Wieters, Hallie C, PA-C  ALLERGY RELIEF 10 MG tablet Take 10 mg by mouth daily. 01/11/20   [provider]  amLODipine (NORVASC) 5 MG tablet Take 5 mg by mouth daily.    [provider]  cetirizine (ZYRTEC ALLERGY) 10 MG tablet Take 1 tablet (10 mg total) by mouth daily. 05/17/20   Avegno, Darrelyn Hillock, FNP  desogestrel-ethinyl estradiol (MIRCETTE) 0.15-0.02/0.01 MG (21/5) tablet Take 1 tablet by mouth daily. 08/07/21   Florian Buff, MD  escitalopram (LEXAPRO) 20 MG tablet Take 1 tablet (20 mg total) by mouth daily. 02/20/21   Florian Buff, MD  escitalopram (LEXAPRO) 20 MG tablet Take 1 tablet (20 mg total) by mouth daily. 08/07/21    Florian Buff, MD  montelukast (SINGULAIR) 10 MG tablet Take 10 mg by mouth daily. 01/11/20   [provider]  progesterone (PROMETRIUM) 200 MG capsule Take 1 capsule (200 mg total) by mouth daily. At bedtime 01/23/21   Florian Buff, MD      Allergies    Lisinopril and Tomato    Review of Systems   Review of Systems  Constitutional:  Negative for fever.  Respiratory:  Positive for cough. Negative for shortness of breath.   Cardiovascular:  Positive for chest pain.  Gastrointestinal:  Positive for nausea. Negative for abdominal pain and vomiting.  Musculoskeletal:  Positive for myalgias.  Neurological:  Positive for weakness, numbness and headaches.   Physical Exam Updated Vital Signs BP 128/89    Pulse 82    Temp 100.2 F (37.9 C) (Oral)    Resp 15    Ht 5' (1.524 m)    Wt 83.9 kg    LMP 12/07/2020    SpO2 92%    BMI 36.13 kg/m  Physical Exam Vitals and nursing note reviewed.  Constitutional:      General: She is not in acute distress.    Appearance: She is well-developed. She is not ill-appearing or diaphoretic.  HENT:     Head: Normocephalic and atraumatic.  Eyes:     Extraocular Movements: Extraocular movements intact.  Pupils: Pupils are equal, round, and reactive to light.  Cardiovascular:     Rate and Rhythm: Regular rhythm. Tachycardia present.     Pulses:          Radial pulses are 2+ on the right side and 2+ on the left side.       Dorsalis pedis pulses are 2+ on the right side and 2+ on the left side.     Heart sounds: Normal heart sounds.     Comments: HR low 100s Pulmonary:     Effort: Pulmonary effort is normal.     Breath sounds: Normal breath sounds.  Abdominal:     Palpations: Abdomen is soft.     Tenderness: There is no abdominal tenderness.  Musculoskeletal:     Cervical back: Normal range of motion.  Skin:    General: Skin is warm and dry.  Neurological:     Mental Status: She is alert.     Comments: Patient has no facial droop.  She  has normal strength in right upper and right lower extremity.  Initially when I examined her she had very poor effort in the left upper and left lower extremity though this seemed to get better after some pain control.  She is now moving her left upper and lower extremity without difficulty.    ED Results / Procedures / Treatments   Labs (all labs ordered are listed, but only abnormal results are displayed) Labs Reviewed  RESP PANEL BY RT-PCR (FLU A&B, COVID) ARPGX2 - Abnormal; Notable for the following components:      Result Value   SARS Coronavirus 2 by RT PCR POSITIVE (*)    All other components within normal limits  BASIC METABOLIC PANEL - Abnormal; Notable for the following components:   Sodium 134 (*)    All other components within normal limits  CBC  D-DIMER, QUANTITATIVE  HCG, SERUM, QUALITATIVE  TROPONIN I (HIGH SENSITIVITY)    EKG EKG Interpretation  Date/Time:  Sunday January 14 2022 20:18:40 EST Ventricular Rate:  104 PR Interval:  166 QRS Duration: 96 QT Interval:  298 QTC Calculation: 392 R Axis:   80 Text Interpretation: Sinus tachycardia Low voltage, precordial leads no acute ST/T changes T waves improved when compared to Jan 2022 Confirmed by Sherwood Gambler [phone]) on 01/14/2022 8:27:50 PM  Radiology DG Chest Port 1 View  Result Date: 01/14/2022 CLINICAL DATA:  Left-sided chest pain for several days with fevers, initial encounter EXAM: PORTABLE CHEST 1 VIEW COMPARISON:  11/08/2020 FINDINGS: The heart size and mediastinal contours are within normal limits. Both lungs are clear. The visualized skeletal structures are unremarkable. IMPRESSION: No active disease. Electronically Signed   By: Inez Catalina M.D.   On: 01/14/2022 21:17    Procedures Procedures    Medications Ordered in ED Medications  HYDROmorphone (DILAUDID) injection 1 mg (1 mg Intravenous Given 01/14/22 2119)  sodium chloride 0.9 % bolus 1,000 mL (0 mLs Intravenous Stopped 01/14/22 2327)   prochlorperazine (COMPAZINE) injection 10 mg (10 mg Intravenous Given 01/14/22 2220)  ketorolac (TORADOL) 15 MG/ML injection 15 mg (15 mg Intravenous Given 01/14/22 2220)  diphenhydrAMINE (BENADRYL) injection 25 mg (25 mg Intravenous Given 01/14/22 2220)  acetaminophen (TYLENOL) tablet 650 mg (650 mg Oral Given 01/14/22 2220)    ED Course/ Medical Decision Making/ A&P                           Medical Decision Making Amount and/or  Complexity of Data Reviewed Labs: ordered. Radiology: ordered and independent interpretation performed. ECG/medicine tests: ordered and independent interpretation performed.  Risk OTC drugs. Prescription drug management.   Patient's CP is pretty atypical. Overall, I suspect her symptoms are related to covid.  This goes along with her symptoms including body aches, cough, chest pain, headache.  There was initially concern that she was describing left-sided weakness and numbness but her exam was pretty functional and on repeat exam her weakness no longer seems to be present.  Highly doubt acute CNS emergency such as stroke or infection.  Otherwise, a D-dimer had been sent for low risk PE with the tachycardia but this is negative and so I do not think she needs CTA.  She is feeling better and appears stable for discharge.  Labs independently reviewed and her COVID test is positive but troponin is negative, and after several days of symptoms I do not think she needs a repeat or that ACS is likely.  Doubt PE/dissection.  Other labs are benign.  Chest x-ray reviewed by myself and there is no infiltrate consistent with pneumonia.         Final Clinical Impression(s) / ED Diagnoses Final diagnoses:  [id number]    Rx / DC Orders ED Discharge Orders          Ordered    ondansetron (ZOFRAN-ODT) 4 MG disintegrating tablet  Every 8 hours PRN        01/14/22 2317              Sherwood Gambler, MD 01/14/22 2342

## 2022-01-14 NOTE — ED Notes (Signed)
Preg test d/c. Pt has hysterectomy

## 2022-01-23 ENCOUNTER — Ambulatory Visit
Admission: EM | Admit: 2022-01-23 | Discharge: 2022-01-23 | Disposition: A | Discharge status: Home / Self Care | Payer: Managed Care, Other (non HMO) | Attending: Family Medicine | Admitting: Family Medicine

## 2022-01-23 ENCOUNTER — Other Ambulatory Visit: Payer: Self-pay

## 2022-01-23 DIAGNOSIS — Z202 Contact with and (suspected) exposure to infections with a predominantly sexual mode of transmission: Secondary | ICD-10-CM | POA: Insufficient documentation

## 2022-01-23 DIAGNOSIS — B3731 Acute candidiasis of vulva and vagina: Secondary | ICD-10-CM | POA: Diagnosis not present

## 2022-01-23 MED ORDER — METRONIDAZOLE 500 MG PO TABS: 500.0000 mg | ORAL_TABLET | Freq: Two times a day (BID) | ORAL | 0 refills | Status: DC

## 2022-01-23 NOTE — ED Triage Notes (Signed)
Pt states her partner tested positive for std , denies symptoms

## 2022-01-23 NOTE — ED Provider Notes (Signed)
[facility]    CSN: [id number] Arrival date & time: 01/23/22  1224      History   Chief Complaint Chief Complaint  Patient presents with   Exposure to STD    HPI Barbara Lam is a 31 y.o. female.   Presenting today requesting STI testing as her partner recently found out she tested positive for trichomonas.  She denies any vaginal discharge, itching, irritation, dysuria, pelvic or abdominal pain, rashes.     Past Medical History:  Diagnosis Date   Asthma    Chronic headaches    Hypertension     Patient Active Problem List   Diagnosis Date Noted   Endometriosis of pelvis    Dysmenorrhea    Dyspareunia, female    Menorrhagia with irregular cycle     Past Surgical History:  Procedure Laterality Date   ABDOMINAL HYSTERECTOMY N/A 01/11/2021   Procedure: HYSTERECTOMY ABDOMINAL;  Surgeon: Florian Buff, MD;  Location: [facility];  Service: Gynecology;  Laterality: N/A;   EYE SURGERY     SALPINGOOPHORECTOMY Bilateral 01/11/2021   Procedure: OPEN SALPINGO OOPHORECTOMY;  Surgeon: Florian Buff, MD;  Location: [facility];  Service: Gynecology;  Laterality: Bilateral;    OB History     Gravida  0   Para  0   Term  0   Preterm  0   AB  0   Living  0      SAB  0   IAB  0   Ectopic  0   Multiple  0   Live Births  0            Home Medications    Prior to Admission medications   Medication Sig Start Date End Date Taking? Authorizing Provider  metroNIDAZOLE (FLAGYL) 500 MG tablet Take 1 tablet (500 mg total) by mouth 2 (two) times daily. 01/23/22  Yes Volney American, PA-C  albuterol (VENTOLIN HFA) 108 (90 Base) MCG/ACT inhaler Inhale 1-2 puffs into the lungs every 6 (six) hours as needed for wheezing or shortness of breath. 04/04/19   Wieters, Hallie C, PA-C  ALLERGY RELIEF 10 MG tablet Take 10 mg by mouth daily. 01/11/20   [provider]  amLODipine (NORVASC) 5 MG tablet Take 5 mg by mouth daily.    [provider]   cetirizine (ZYRTEC ALLERGY) 10 MG tablet Take 1 tablet (10 mg total) by mouth daily. 05/17/20   Avegno, Darrelyn Hillock, FNP  desogestrel-ethinyl estradiol (MIRCETTE) 0.15-0.02/0.01 MG (21/5) tablet Take 1 tablet by mouth daily. 08/07/21   Florian Buff, MD  escitalopram (LEXAPRO) 20 MG tablet Take 1 tablet (20 mg total) by mouth daily. 02/20/21   Florian Buff, MD  escitalopram (LEXAPRO) 20 MG tablet Take 1 tablet (20 mg total) by mouth daily. 08/07/21   Florian Buff, MD  montelukast (SINGULAIR) 10 MG tablet Take 10 mg by mouth daily. 01/11/20   [provider]  ondansetron (ZOFRAN-ODT) 4 MG disintegrating tablet Take 1 tablet (4 mg total) by mouth every 8 (eight) hours as needed for nausea or vomiting. 01/14/22   Sherwood Gambler, MD  progesterone (PROMETRIUM) 200 MG capsule Take 1 capsule (200 mg total) by mouth daily. At bedtime 01/23/21   Florian Buff, MD    Family History Family History  Problem Relation Age of Onset   COPD Paternal Grandmother    COPD Maternal Grandmother    Congestive Heart Failure Maternal Grandmother    Congestive Heart Failure Maternal  Grandfather    COPD Maternal Grandfather    Healthy Father    Other Father        motorcycle accident   Asthma Mother    Emphysema Mother    Berenice Primas' disease Mother    Asthma Brother    Asthma Sister    Asthma Sister    Asthma Sister    Asthma Sister    Asthma Daughter     Social History Social History   Tobacco Use   Smoking status: Some Days    Types: Cigars   Smokeless tobacco: Never  Vaping Use   Vaping Use: Never used  Substance Use Topics   Alcohol use: Yes    Comment: Socially    Drug use: No     Allergies   Lisinopril and Tomato   Review of Systems Review of Systems Per HPI  Physical Exam Triage Vital Signs ED Triage Vitals  Enc Vitals Group     BP 01/23/22 1238 139/89     Pulse Rate 01/23/22 1238 (!) 103     Resp 01/23/22 1238 (!) 28     Temp 01/23/22 1238 97.7 F (36.5 C)     Temp src  --      SpO2 01/23/22 1238 97 %     Weight --      Height --      Head Circumference --      Peak Flow --      Pain Score 01/23/22 1236 0     Pain Loc --      Pain Edu? --      Excl. in [place]? --    No data found.  Updated Vital Signs BP 139/89    Pulse (!) 103    Temp 97.7 F (36.5 C)    Resp (!) 28    LMP 12/07/2020    SpO2 97%   Visual Acuity Right Eye Distance:   Left Eye Distance:   Bilateral Distance:    Right Eye Near:   Left Eye Near:    Bilateral Near:     Physical Exam Vitals and nursing note reviewed.  Constitutional:      Appearance: Normal appearance. She is not ill-appearing.  HENT:     Head: Atraumatic.     Mouth/Throat:     Mouth: Mucous membranes are moist.  Eyes:     Extraocular Movements: Extraocular movements intact.     Conjunctiva/sclera: Conjunctivae normal.  Cardiovascular:     Rate and Rhythm: Normal rate and regular rhythm.     Heart sounds: Normal heart sounds.  Pulmonary:     Effort: Pulmonary effort is normal.     Breath sounds: Normal breath sounds.  Abdominal:     General: Bowel sounds are normal. There is no distension.     Palpations: Abdomen is soft.     Tenderness: There is no abdominal tenderness. There is no right CVA tenderness, left CVA tenderness or guarding.  Genitourinary:    Comments: Exam deferred, self swab performed Musculoskeletal:        General: Normal range of motion.     Cervical back: Normal range of motion and neck supple.  Skin:    General: Skin is warm and dry.  Neurological:     Mental Status: She is alert and oriented to person, place, and time.  Psychiatric:        Mood and Affect: Mood normal.        Thought Content: Thought content normal.  Judgment: Judgment normal.     UC Treatments / Results  Labs (all labs ordered are listed, but only abnormal results are displayed) Labs Reviewed  CERVICOVAGINAL ANCILLARY ONLY    EKG   Radiology No results found.  Procedures Procedures  (including critical care time)  Medications Ordered in UC Medications - No data to display  Initial Impression / Assessment and Plan / UC Course  I have reviewed the triage vital signs and the nursing notes.  Pertinent labs & imaging results that were available during my care of the patient were reviewed by me and considered in my medical decision making (see chart for details).     Vaginal swab pending, given known exposure to trichomonas we will treat with metronidazole while awaiting results.  Discussed abstinence until results return and completion of treatment.  Safe sexual practices reviewed.  Final Clinical Impressions(s) / UC Diagnoses   Final diagnoses:  Exposure to STD   Discharge Instructions   None    ED Prescriptions     Medication Sig Dispense Auth. Provider   metroNIDAZOLE (FLAGYL) 500 MG tablet Take 1 tablet (500 mg total) by mouth 2 (two) times daily. 14 tablet Volney American, [place]      PDMP not reviewed this encounter.   Volney American, [place] 01/23/22 1344

## 2022-01-24 LAB — CERVICOVAGINAL ANCILLARY ONLY
Bacterial Vaginitis (gardnerella): NEGATIVE
Candida Glabrata: NEGATIVE
Candida Vaginitis: POSITIVE — AB
Chlamydia: NEGATIVE
Comment: NEGATIVE
Comment: NEGATIVE
Comment: NEGATIVE
Comment: NEGATIVE
Comment: NEGATIVE
Comment: NORMAL
Neisseria Gonorrhea: NEGATIVE
Trichomonas: NEGATIVE

## 2022-01-25 ENCOUNTER — Telehealth (HOSPITAL_COMMUNITY): Payer: Self-pay | Admitting: Emergency Medicine

## 2022-01-25 MED ORDER — FLUCONAZOLE 150 MG PO TABS: 150.0000 mg | ORAL_TABLET | Freq: Once | ORAL | 0 refills | Status: AC

## 2022-02-19 ENCOUNTER — Ambulatory Visit: Payer: Managed Care, Other (non HMO) | Admitting: Obstetrics & Gynecology

## 2022-02-23 ENCOUNTER — Ambulatory Visit: Payer: Managed Care, Other (non HMO) | Admitting: Obstetrics & Gynecology

## 2022-03-13 ENCOUNTER — Ambulatory Visit: Payer: Managed Care, Other (non HMO) | Admitting: Obstetrics & Gynecology

## 2022-04-20 ENCOUNTER — Ambulatory Visit: Payer: Managed Care, Other (non HMO) | Admitting: Obstetrics & Gynecology

## 2022-05-17 ENCOUNTER — Emergency Department (HOSPITAL_COMMUNITY)
Admission: EM | Admit: 2022-05-17 | Discharge: 2022-05-17 | Disposition: A | Discharge status: Home / Self Care | Payer: Self-pay | Attending: Emergency Medicine | Admitting: Emergency Medicine

## 2022-05-17 ENCOUNTER — Encounter (HOSPITAL_COMMUNITY): Payer: Self-pay

## 2022-05-17 ENCOUNTER — Emergency Department (HOSPITAL_COMMUNITY): Payer: Self-pay

## 2022-05-17 ENCOUNTER — Other Ambulatory Visit: Payer: Self-pay

## 2022-05-17 DIAGNOSIS — M549 Dorsalgia, unspecified: Secondary | ICD-10-CM | POA: Insufficient documentation

## 2022-05-17 DIAGNOSIS — F17210 Nicotine dependence, cigarettes, uncomplicated: Secondary | ICD-10-CM | POA: Insufficient documentation

## 2022-05-17 DIAGNOSIS — J45909 Unspecified asthma, uncomplicated: Secondary | ICD-10-CM | POA: Insufficient documentation

## 2022-05-17 DIAGNOSIS — R079 Chest pain, unspecified: Secondary | ICD-10-CM | POA: Insufficient documentation

## 2022-05-17 DIAGNOSIS — I10 Essential (primary) hypertension: Secondary | ICD-10-CM | POA: Insufficient documentation

## 2022-05-17 DIAGNOSIS — M545 Low back pain, unspecified: Secondary | ICD-10-CM | POA: Insufficient documentation

## 2022-05-17 MED ORDER — METHOCARBAMOL 500 MG PO TABS: 500.0000 mg | ORAL_TABLET | Freq: Three times a day (TID) | ORAL | 0 refills | Status: DC | PRN

## 2022-05-17 MED ORDER — NAPROXEN 250 MG PO TABS
500.0000 mg | ORAL_TABLET | Freq: Once | ORAL | Status: AC
  Administered 2022-05-17: 500 mg via ORAL
  Filled 2022-05-17: qty 2

## 2022-05-17 MED ORDER — NAPROXEN 500 MG PO TABS: 500.0000 mg | ORAL_TABLET | Freq: Two times a day (BID) | ORAL | 0 refills | Status: DC
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
## 2022-05-17 NOTE — ED Notes (Signed)
Patient verbalizes understanding of discharge instructions. Opportunity for questioning and answers were provided. Armband removed by staff, pt discharged from ED. Ambulated out to lobby  

## 2022-05-21 ENCOUNTER — Ambulatory Visit: Payer: Self-pay | Admitting: Obstetrics & Gynecology

## 2022-05-22 ENCOUNTER — Ambulatory Visit: Payer: Self-pay | Admitting: Obstetrics & Gynecology

## 2022-05-25 ENCOUNTER — Ambulatory Visit: Payer: Self-pay | Admitting: Obstetrics & Gynecology

## 2022-05-28 ENCOUNTER — Ambulatory Visit: Payer: Self-pay | Admitting: Obstetrics & Gynecology

## 2022-12-25 DIAGNOSIS — R7989 Other specified abnormal findings of blood chemistry: Secondary | ICD-10-CM | POA: Diagnosis not present

## 2022-12-25 DIAGNOSIS — N926 Irregular menstruation, unspecified: Secondary | ICD-10-CM | POA: Diagnosis not present

## 2022-12-25 DIAGNOSIS — Z01419 Encounter for gynecological examination (general) (routine) without abnormal findings: Secondary | ICD-10-CM | POA: Diagnosis not present

## 2022-12-27 DIAGNOSIS — R7989 Other specified abnormal findings of blood chemistry: Secondary | ICD-10-CM | POA: Diagnosis not present

## 2023-01-07 DIAGNOSIS — K5900 Constipation, unspecified: Secondary | ICD-10-CM | POA: Diagnosis not present

## 2023-01-09 ENCOUNTER — Ambulatory Visit (HOSPITAL_COMMUNITY)
Admission: RE | Admit: 2023-01-09 | Discharge: 2023-01-09 | Disposition: A | Discharge status: Home / Self Care | Payer: Self-pay | Source: Ambulatory Visit | Attending: Physician Assistant | Admitting: Physician Assistant

## 2023-01-09 ENCOUNTER — Other Ambulatory Visit (HOSPITAL_COMMUNITY): Payer: Self-pay | Admitting: Physician Assistant

## 2023-01-09 DIAGNOSIS — R109 Unspecified abdominal pain: Secondary | ICD-10-CM | POA: Insufficient documentation

## 2023-02-01 ENCOUNTER — Ambulatory Visit
Admission: EM | Admit: 2023-02-01 | Discharge: 2023-02-01 | Disposition: A | Discharge status: Home / Self Care | Payer: 59

## 2023-02-01 DIAGNOSIS — R112 Nausea with vomiting, unspecified: Secondary | ICD-10-CM

## 2023-02-01 DIAGNOSIS — J029 Acute pharyngitis, unspecified: Secondary | ICD-10-CM | POA: Diagnosis not present

## 2023-02-01 DIAGNOSIS — R509 Fever, unspecified: Secondary | ICD-10-CM

## 2023-02-01 LAB — POCT INFLUENZA A/B
Influenza A, POC: NEGATIVE
Influenza B, POC: NEGATIVE

## 2023-02-01 LAB — POCT RAPID STREP A (OFFICE): Rapid Strep A Screen: NEGATIVE

## 2023-02-01 MED ORDER — ONDANSETRON 4 MG PO TBDP
4.0000 mg | ORAL_TABLET | Freq: Three times a day (TID) | ORAL | 0 refills | Status: AC | PRN
Start: 2023-02-01 — End: ?

## 2023-02-01 MED ORDER — ACETAMINOPHEN 500 MG PO TABS
1000.0000 mg | ORAL_TABLET | Freq: Once | ORAL | Status: AC
  Administered 2023-02-01: 1000 mg via ORAL

## 2023-02-01 NOTE — ED Triage Notes (Signed)
Pt reports sore throat and vomiting since last night.

## 2023-02-01 NOTE — Discharge Instructions (Addendum)
Your Influenza and Strep Test are all negative. We encourage conservative treatment with symptom relief. We encourage you to use Tylenol for your pain (Remember to use as directed do not exceed daily dosing recommendations). We also encourage salt water gargles for your sore throat. You should also consider throat lozenges and chloraseptic spray.  You have been prescribed Ondansetron 4 md for the nausea/vomiting.

## 2023-02-01 NOTE — ED Provider Notes (Signed)
[facility]    CSN: [id number] Arrival date & time: 02/01/23  1548      History   Chief Complaint Chief Complaint  Patient presents with   Sore Throat   Emesis    HPI Barbara Lam is a 32 y.o. female.   HPI  She is in today for sore throat and vomiting for one day. She is also complaining of chills and body aches. She reports eating ice cream on yesterday and she started to feel funny. She endorses that vomiting had rice in it because she was sitting down to eat rice. Denies any blood. Denies headache, dizziness, visual changes, shortness of breath, dyspnea on exertion, chest pain. Past Medical History:  Diagnosis Date   Asthma    Chronic headaches    Hypertension     Patient Active Problem List   Diagnosis Date Noted   Endometriosis of pelvis    Dysmenorrhea    Dyspareunia, female    Menorrhagia with irregular cycle     Past Surgical History:  Procedure Laterality Date   ABDOMINAL HYSTERECTOMY N/A 01/11/2021   Procedure: HYSTERECTOMY ABDOMINAL;  Surgeon: Florian Buff, MD;  Location: [facility];  Service: Gynecology;  Laterality: N/A;   EYE SURGERY     SALPINGOOPHORECTOMY Bilateral 01/11/2021   Procedure: OPEN SALPINGO OOPHORECTOMY;  Surgeon: Florian Buff, MD;  Location: [facility];  Service: Gynecology;  Laterality: Bilateral;    OB History     Gravida  0   Para  0   Term  0   Preterm  0   AB  0   Living  0      SAB  0   IAB  0   Ectopic  0   Multiple  0   Live Births  0            Home Medications    Prior to Admission medications   Medication Sig Start Date End Date Taking? Authorizing Provider  gabapentin (NEURONTIN) 100 MG capsule Take 100 mg by mouth 3 (three) times daily. 01/15/23  Yes [provider]  ondansetron (ZOFRAN-ODT) 4 MG disintegrating tablet Take 1 tablet (4 mg total) by mouth every 8 (eight) hours as needed for nausea or vomiting. 02/01/23  Yes Vevelyn Francois, NP  albuterol (VENTOLIN HFA) 108 (90  Base) MCG/ACT inhaler Inhale 1-2 puffs into the lungs every 6 (six) hours as needed for wheezing or shortness of breath. 04/04/19   Wieters, Hallie C, PA-C  cetirizine (ZYRTEC ALLERGY) 10 MG tablet Take 1 tablet (10 mg total) by mouth daily. 05/17/20   Avegno, Darrelyn Hillock, FNP  escitalopram (LEXAPRO) 20 MG tablet Take 1 tablet (20 mg total) by mouth daily. 08/07/21   Florian Buff, MD    Family History Family History  Problem Relation Age of Onset   COPD Paternal Grandmother    COPD Maternal Grandmother    Congestive Heart Failure Maternal Grandmother    Congestive Heart Failure Maternal Grandfather    COPD Maternal Grandfather    Healthy Father    Other Father        motorcycle accident   Asthma Mother    Emphysema Mother    Berenice Primas' disease Mother    Asthma Brother    Asthma Sister    Asthma Sister    Asthma Sister    Asthma Sister    Asthma Daughter     Social History Social History   Tobacco Use   Smoking status: Some  Days    Types: Cigars   Smokeless tobacco: Never  Vaping Use   Vaping Use: Never used  Substance Use Topics   Alcohol use: Yes    Comment: Socially    Drug use: No     Allergies   Lisinopril and Tomato   Review of Systems Review of Systems   Physical Exam Triage Vital Signs ED Triage Vitals  Enc Vitals Group     BP      Pulse      Resp      Temp      Temp src      SpO2      Weight      Height      Head Circumference      Peak Flow      Pain Score      Pain Loc      Pain Edu?      Excl. in [place]?    No data found.  Updated Vital Signs BP 130/87 (BP Location: Right Arm)   Pulse 99   Temp (!) 100.9 F (38.3 C) (Oral)   Resp 16   LMP 12/07/2020   SpO2 95%   Visual Acuity Right Eye Distance:   Left Eye Distance:   Bilateral Distance:    Right Eye Near:   Left Eye Near:    Bilateral Near:     Physical Exam Constitutional:      General: She is not in acute distress.    Appearance: She is obese. She is not ill-appearing.   HENT:     Head: Normocephalic and atraumatic.     Nose: No congestion or rhinorrhea.     Mouth/Throat:     Mouth: Oral lesions (lower lip) present.     Tonsils: No tonsillar exudate or tonsillar abscesses. 2+ on the right. 2+ on the left.  Eyes:     Conjunctiva/sclera: Conjunctivae normal.     Pupils: Pupils are equal, round, and reactive to light.  Cardiovascular:     Rate and Rhythm: Normal rate and regular rhythm.  Pulmonary:     Effort: Pulmonary effort is normal.     Breath sounds: Normal breath sounds.  Musculoskeletal:     Cervical back: Normal range of motion and neck supple.  Neurological:     Mental Status: She is alert.      UC Treatments / Results  Labs (all labs ordered are listed, but only abnormal results are displayed) Labs Reviewed  POCT RAPID STREP A (OFFICE)  POCT INFLUENZA A/B    EKG   Radiology No results found.  Procedures Procedures (including critical care time)  Medications Ordered in UC Medications  acetaminophen (TYLENOL) tablet 1,000 mg (1,000 mg Oral Given 02/01/23 1641)    Initial Impression / Assessment and Plan / UC Course  I have reviewed the triage vital signs and the nursing notes.  Pertinent labs & imaging results that were available during my care of the patient were reviewed by me and considered in my medical decision making (see chart for details).     Sore throat Final Clinical Impressions(s) / UC Diagnoses   Final diagnoses:  Sore throat  Fever, unspecified fever cause  Nausea and vomiting, unspecified vomiting type     Discharge Instructions      Your Influenza and Strep Test are all negative. We encourage conservative treatment with symptom relief. We encourage you to use Tylenol for your pain (Remember to use as directed do not exceed daily  dosing recommendations). We also encourage salt water gargles for your sore throat. You should also consider throat lozenges and chloraseptic spray.  You have been  prescribed Ondansetron 4 md for the nausea/vomiting.      ED Prescriptions     Medication Sig Dispense Auth. Provider   ondansetron (ZOFRAN-ODT) 4 MG disintegrating tablet Take 1 tablet (4 mg total) by mouth every 8 (eight) hours as needed for nausea or vomiting. 20 tablet Vevelyn Francois, NP      PDMP not reviewed this encounter.   Dionisio David [place], NP 02/01/23 1729

## 2023-02-04 ENCOUNTER — Other Ambulatory Visit: Payer: Self-pay

## 2023-02-04 ENCOUNTER — Emergency Department (HOSPITAL_COMMUNITY)
Admission: EM | Admit: 2023-02-04 | Discharge: 2023-02-04 | Disposition: A | Discharge status: Home / Self Care | Payer: 59 | Attending: Student | Admitting: Student

## 2023-02-04 ENCOUNTER — Encounter (HOSPITAL_COMMUNITY): Payer: Self-pay

## 2023-02-04 DIAGNOSIS — R1310 Dysphagia, unspecified: Secondary | ICD-10-CM | POA: Diagnosis not present

## 2023-02-04 DIAGNOSIS — Z8616 Personal history of COVID-19: Secondary | ICD-10-CM | POA: Diagnosis not present

## 2023-02-04 DIAGNOSIS — Z1152 Encounter for screening for COVID-19: Secondary | ICD-10-CM | POA: Insufficient documentation

## 2023-02-04 DIAGNOSIS — J039 Acute tonsillitis, unspecified: Secondary | ICD-10-CM | POA: Diagnosis not present

## 2023-02-04 LAB — BASIC METABOLIC PANEL
Anion gap: 11 (ref 5–15)
BUN: 19 mg/dL (ref 6–20)
CO2: 22 mmol/L (ref 22–32)
Calcium: 8.9 mg/dL (ref 8.9–10.3)
Chloride: 99 mmol/L (ref 98–111)
Creatinine, Ser: 1.03 mg/dL — ABNORMAL HIGH (ref 0.44–1.00)
GFR, Estimated: >60 mL/min (ref >60)
Glucose, Bld: 96 mg/dL (ref 70–99)
Potassium: 3.6 mmol/L (ref 3.5–5.1)
Sodium: 132 mmol/L — ABNORMAL LOW (ref 135–145)

## 2023-02-04 LAB — RESP PANEL BY RT-PCR (RSV, FLU A&B, COVID)  RVPGX2
Influenza A by PCR: NEGATIVE
Influenza B by PCR: NEGATIVE
Resp Syncytial Virus by PCR: NEGATIVE
SARS Coronavirus 2 by RT PCR: NEGATIVE

## 2023-02-04 LAB — CBC WITH DIFFERENTIAL/PLATELET
Abs Immature Granulocytes: 0.03 10*3/uL (ref 0.00–0.07)
Basophils Absolute: 0 10*3/uL (ref 0.0–0.1)
Basophils Relative: 0 %
Eosinophils Absolute: 0 10*3/uL (ref 0.0–0.5)
Eosinophils Relative: 0 %
HCT: 39.7 % (ref 36.0–46.0)
Hemoglobin: 13.1 g/dL (ref 12.0–15.0)
Immature Granulocytes: 0 %
Lymphocytes Relative: 16 %
Lymphs Abs: 1.4 10*3/uL (ref 0.7–4.0)
MCH: 29.6 pg (ref 26.0–34.0)
MCHC: 33 g/dL (ref 30.0–36.0)
MCV: 89.6 fL (ref 80.0–100.0)
Monocytes Absolute: 1.1 10*3/uL — ABNORMAL HIGH (ref 0.1–1.0)
Monocytes Relative: 13 %
Neutro Abs: 6 10*3/uL (ref 1.7–7.7)
Neutrophils Relative %: 71 %
Platelets: 182 10*3/uL (ref 150–400)
RBC: 4.43 MIL/uL (ref 3.87–5.11)
RDW: 12.8 % (ref 11.5–15.5)
WBC: 8.6 10*3/uL (ref 4.0–10.5)
nRBC: 0 % (ref 0.0–0.2)

## 2023-02-04 LAB — MONONUCLEOSIS SCREEN: Mono Screen: NEGATIVE

## 2023-02-04 LAB — GROUP A STREP BY PCR: Group A Strep by PCR: NOT DETECTED

## 2023-02-04 MED ORDER — SODIUM CHLORIDE 0.9 % IV BOLUS
1000.0000 mL | Freq: Once | INTRAVENOUS | Status: AC
  Administered 2023-02-04: 1000 mL via INTRAVENOUS

## 2023-02-04 MED ORDER — AMOXICILLIN 500 MG PO CAPS: 500.0000 mg | ORAL_CAPSULE | Freq: Three times a day (TID) | ORAL | 0 refills | Status: DC

## 2023-02-04 MED ORDER — IBUPROFEN 800 MG PO TABS
800.0000 mg | ORAL_TABLET | Freq: Once | ORAL | Status: AC
  Administered 2023-02-04: 800 mg via ORAL
  Filled 2023-02-04: qty 1

## 2023-02-04 MED ORDER — AMOXICILLIN 250 MG PO CAPS
500.0000 mg | ORAL_CAPSULE | Freq: Once | ORAL | Status: AC
  Administered 2023-02-04: 500 mg via ORAL
  Filled 2023-02-04: qty 2

## 2023-02-04 NOTE — ED Triage Notes (Signed)
Complains of continued sore throat and inability to keep anything down with fever.  Large swollen tonsils.  Was tested last week for strep which was negative.  Also complains of diarrhea.

## 2023-02-04 NOTE — Discharge Instructions (Signed)
You are being treated for acute tonsillitis with the antibiotic prescribed.  Take the entire course of this medication.  I recommend continuing to take either ibuprofen or Tylenol which will also help with your throat pain.  Make sure you are drinking plenty of fluids, get rest.  Get rechecked if your symptoms are not improving over the next 3 to 5 days with this treatment plan.

## 2023-02-04 NOTE — ED Provider Notes (Signed)
[facility] Provider Note   CSN: [id number] Arrival date & time: 02/04/23  1345     History {Add pertinent medical, surgical, social history, OB history to HPI:1} Chief Complaint  Patient presents with   Sore Throat    Barbara Lam is a 32 y.o. female.  The history is provided by the patient.       Home Medications Prior to Admission medications   Medication Sig Start Date End Date Taking? Authorizing Provider  albuterol (VENTOLIN HFA) 108 (90 Base) MCG/ACT inhaler Inhale 1-2 puffs into the lungs every 6 (six) hours as needed for wheezing or shortness of breath. 04/04/19   Wieters, Hallie C, PA-C  cetirizine (ZYRTEC ALLERGY) 10 MG tablet Take 1 tablet (10 mg total) by mouth daily. 05/17/20   Avegno, Darrelyn Hillock, FNP  escitalopram (LEXAPRO) 20 MG tablet Take 1 tablet (20 mg total) by mouth daily. 08/07/21   Florian Buff, MD  gabapentin (NEURONTIN) 100 MG capsule Take 100 mg by mouth 3 (three) times daily. 01/15/23   [provider]  ondansetron (ZOFRAN-ODT) 4 MG disintegrating tablet Take 1 tablet (4 mg total) by mouth every 8 (eight) hours as needed for nausea or vomiting. 02/01/23   Vevelyn Francois, NP      Allergies    Lisinopril and Tomato    Review of Systems   Review of Systems  Physical Exam Updated Vital Signs BP 116/75 (BP Location: Left Arm)   Pulse (!) 107   Temp 99.7 F (37.6 C) (Oral)   Resp 15   Ht 5' (1.524 m)   Wt 82.6 kg   LMP 12/07/2020   SpO2 97%   BMI 35.54 kg/m  Physical Exam  ED Results / Procedures / Treatments   Labs (all labs ordered are listed, but only abnormal results are displayed) Labs Reviewed  CBC WITH DIFFERENTIAL/PLATELET - Abnormal; Notable for the following components:      Result Value   Monocytes Absolute 1.1 (*)    All other components within normal limits  BASIC METABOLIC PANEL - Abnormal; Notable for the following components:   Sodium 132 (*)    Creatinine, Ser  1.03 (*)    All other components within normal limits  RESP PANEL BY RT-PCR (RSV, FLU A&B, COVID)  RVPGX2  GROUP A STREP BY PCR  MONONUCLEOSIS SCREEN    EKG None  Radiology No results found.  Procedures Procedures  {Document cardiac monitor, telemetry assessment procedure when appropriate:1}  Medications Ordered in ED Medications  ibuprofen (ADVIL) tablet 800 mg (800 mg Oral Given 02/04/23 1647)  sodium chloride 0.9 % bolus 1,000 mL (0 mLs Intravenous Stopped 02/04/23 1840)    ED Course/ Medical Decision Making/ A&P   {   Click here for ABCD2, HEART and other calculatorsREFRESH Note before signing :1}                          Medical Decision Making Amount and/or Complexity of Data Reviewed Labs: ordered.  Risk Prescription drug management.   ***  {Document critical care time when appropriate:1} {Document review of labs and clinical decision tools ie heart score, Chads2Vasc2 etc:1}  {Document your independent review of radiology images, and any outside records:1} {Document your discussion with family members, caretakers, and with consultants:1} {Document social determinants of health affecting pt's care:1} {Document your decision making why or why not admission, treatments were needed:1} Final Clinical Impression(s) / ED Diagnoses  Final diagnoses:  None    Rx / DC Orders ED Discharge Orders     None

## 2023-02-14 DIAGNOSIS — J45909 Unspecified asthma, uncomplicated: Secondary | ICD-10-CM | POA: Diagnosis not present

## 2023-02-14 DIAGNOSIS — G4701 Insomnia due to medical condition: Secondary | ICD-10-CM | POA: Diagnosis not present

## 2023-02-14 DIAGNOSIS — E785 Hyperlipidemia, unspecified: Secondary | ICD-10-CM | POA: Diagnosis not present

## 2023-02-14 DIAGNOSIS — I10 Essential (primary) hypertension: Secondary | ICD-10-CM | POA: Diagnosis not present

## 2023-02-18 ENCOUNTER — Telehealth: Payer: Self-pay

## 2023-02-18 NOTE — Telephone Encounter (Signed)
Received referral from Marella Bile PA-C at [facility] for sleep apnea eval, insomnia d/t medical condition. Placed in sleep mailbox

## 2023-03-25 ENCOUNTER — Encounter: Payer: Self-pay | Admitting: Obstetrics & Gynecology

## 2023-03-25 ENCOUNTER — Ambulatory Visit (INDEPENDENT_AMBULATORY_CARE_PROVIDER_SITE_OTHER): Payer: 59 | Admitting: Obstetrics & Gynecology

## 2023-03-25 VITALS — BP 121/88 | HR 95 | Ht 66.0 in | Wt 192.0 lb

## 2023-03-25 DIAGNOSIS — N951 Menopausal and female climacteric states: Secondary | ICD-10-CM

## 2023-03-25 DIAGNOSIS — Z01419 Encounter for gynecological examination (general) (routine) without abnormal findings: Secondary | ICD-10-CM

## 2023-03-25 DIAGNOSIS — B3731 Acute candidiasis of vulva and vagina: Secondary | ICD-10-CM | POA: Diagnosis not present

## 2023-03-25 DIAGNOSIS — Z8742 Personal history of other diseases of the female genital tract: Secondary | ICD-10-CM

## 2023-03-25 MED ORDER — VEOZAH 45 MG PO TABS: 1.0000 | ORAL_TABLET | Freq: Every day | ORAL | 11 refills | Status: DC

## 2023-03-25 MED ORDER — TERCONAZOLE 0.4 % VA CREA: 1.0000 | TOPICAL_CREAM | Freq: Every day | VAGINAL | 0 refills | Status: DC

## 2023-03-25 NOTE — Progress Notes (Signed)
Subjective:     Barbara Lam is a 32 y.o. female here for a routine exam.  Patient's last menstrual period was 12/07/2020. G0P0000 Birth Control Method:  hysterectomy Menstrual Calendar(currently): amenorrhea  Current complaints: nightsweats.   Current acute medical issues:     Recent Gynecologic History Patient's last menstrual period was 12/07/2020. Last Pap: 2021,   Last mammogram: ,    Past Medical History:  Diagnosis Date   Asthma    Chronic headaches    Hypertension     Past Surgical History:  Procedure Laterality Date   ABDOMINAL HYSTERECTOMY N/A 01/11/2021   Procedure: HYSTERECTOMY ABDOMINAL;  Surgeon: Lazaro Arms, MD;  Location: [facility];  Service: Gynecology;  Laterality: N/A;   EYE SURGERY     SALPINGOOPHORECTOMY Bilateral 01/11/2021   Procedure: OPEN SALPINGO OOPHORECTOMY;  Surgeon: Lazaro Arms, MD;  Location: [facility];  Service: Gynecology;  Laterality: Bilateral;    OB History     Gravida  0   Para  0   Term  0   Preterm  0   AB  0   Living  0      SAB  0   IAB  0   Ectopic  0   Multiple  0   Live Births  0           Social History   Socioeconomic History   Marital status: Single    Spouse name: Not on file   Number of children: Not on file   Years of education: Not on file   Highest education level: Not on file  Occupational History   Not on file  Tobacco Use   Smoking status: Some Days    Types: Cigars   Smokeless tobacco: Never  Vaping Use   Vaping Use: Never used  Substance and Sexual Activity   Alcohol use: Yes    Comment: Socially    Drug use: No   Sexual activity: Not Currently    Birth control/protection: Surgical    Comment: hyst  Other Topics Concern   Not on file  Social History Narrative   Not on file   Social Determinants of Health   Financial Resource Strain: Medium Risk (03/25/2023)   Overall Financial Resource Strain (CARDIA)    Difficulty of Paying Living Expenses: Somewhat hard  Food  Insecurity: No Food Insecurity (03/25/2023)   Hunger Vital Sign    Worried About Running Out of Food in the Last Year: Never true    Ran Out of Food in the Last Year: Never true  Transportation Needs: No Transportation Needs (03/25/2023)   PRAPARE - Administrator, Civil Service (Medical): No    Lack of Transportation (Non-Medical): No  Physical Activity: Sufficiently Active (03/25/2023)   Exercise Vital Sign    Days of Exercise per Week: 7 days    Minutes of Exercise per Session: 60 min  Stress: No Stress Concern Present (03/25/2023)   [employer] of Occupational Health - Occupational Stress Questionnaire    Feeling of Stress : Only a little  Social Connections: Moderately Integrated (03/25/2023)   Social Connection and Isolation Panel [NHANES]    Frequency of Communication with Friends and Family: More than three times a week    Frequency of Social Gatherings with Friends and Family: More than three times a week    Attends Religious Services: More than 4 times per year    Active Member of Clubs or Organizations: Yes    Attends  Engineer, structuralClub or Organization Meetings: More than 4 times per year    Marital Status: Never married    Family History  Problem Relation Age of Onset   COPD Paternal Grandmother    COPD Maternal Grandmother    Congestive Heart Failure Maternal Grandmother    Congestive Heart Failure Maternal Grandfather    COPD Maternal Grandfather    Healthy Father    Other Father        motorcycle accident   Asthma Mother    Emphysema Mother    Luiz BlareGraves' disease Mother    Asthma Brother    Asthma Sister    Asthma Sister    Asthma Sister    Asthma Sister    Asthma Daughter      Current Outpatient Medications:    albuterol (VENTOLIN HFA) 108 (90 Base) MCG/ACT inhaler, Inhale 1-2 puffs into the lungs every 6 (six) hours as needed for wheezing or shortness of breath., Disp: 1 Inhaler, Rfl: 0   amoxicillin (AMOXIL) 500 MG capsule, Take 1 capsule (500 mg total) by  mouth 3 (three) times daily., Disp: 30 capsule, Rfl: 0   BREO ELLIPTA 100-25 MCG/ACT AEPB, SMARTSIG:1 Inhalation By Mouth Daily, Disp: , Rfl:    cetirizine (ZYRTEC ALLERGY) 10 MG tablet, Take 1 tablet (10 mg total) by mouth daily., Disp: 30 tablet, Rfl: 0   escitalopram (LEXAPRO) 20 MG tablet, Take 1 tablet (20 mg total) by mouth daily., Disp: 30 tablet, Rfl: 11   Fezolinetant (VEOZAH) 45 MG TABS, Take 1 tablet (45 mg total) by mouth at bedtime., Disp: 30 tablet, Rfl: 11   furosemide (LASIX) 40 MG tablet, Take 20 mg by mouth daily., Disp: , Rfl:    gabapentin (NEURONTIN) 100 MG capsule, Take 100 mg by mouth 3 (three) times daily., Disp: , Rfl:    losartan (COZAAR) 25 MG tablet, Take 25 mg by mouth daily., Disp: , Rfl:    montelukast (SINGULAIR) 10 MG tablet, Take 10 mg by mouth daily., Disp: , Rfl:    ondansetron (ZOFRAN-ODT) 4 MG disintegrating tablet, Take 1 tablet (4 mg total) by mouth every 8 (eight) hours as needed for nausea or vomiting., Disp: 20 tablet, Rfl: 0   terconazole (TERAZOL 7) 0.4 % vaginal cream, Place 1 applicator vaginally at bedtime., Disp: 45 g, Rfl: 0   traZODone (DESYREL) 100 MG tablet, Take 100 mg by mouth at bedtime., Disp: , Rfl:   Review of Systems  Review of Systems  Constitutional: Negative for fever, chills, weight loss, malaise/fatigue and diaphoresis.  HENT: Negative for hearing loss, ear pain, nosebleeds, congestion, sore throat, neck pain, tinnitus and ear discharge.   Eyes: Negative for blurred vision, double vision, photophobia, pain, discharge and redness.  Respiratory: Negative for cough, hemoptysis, sputum production, shortness of breath, wheezing and stridor.   Cardiovascular: Negative for chest pain, palpitations, orthopnea, claudication, leg swelling and PND.  Gastrointestinal: negative for abdominal pain. Negative for heartburn, nausea, vomiting, diarrhea, constipation, blood in stool and melena.  Genitourinary: Negative for dysuria, urgency,  frequency, hematuria and flank pain.  Musculoskeletal: Negative for myalgias, back pain, joint pain and falls.  Skin: Negative for itching and rash.  Neurological: Negative for dizziness, tingling, tremors, sensory change, speech change, focal weakness, seizures, loss of consciousness, weakness and headaches.  Endo/Heme/Allergies: Negative for environmental allergies and polydipsia. Does not bruise/bleed easily.  Psychiatric/Behavioral: Negative for depression, suicidal ideas, hallucinations, memory loss and substance abuse. The patient is not nervous/anxious and does not have insomnia.  Objective:  Blood pressure 121/88, pulse 95, height 5\' 6"  (1.676 m), weight 192 lb (87.1 kg), last menstrual period 12/07/2020.   Physical Exam  Vitals reviewed. Constitutional: She is oriented to person, place, and time. She appears well-developed and well-nourished.  HENT:  Head: Normocephalic and atraumatic.        Right Ear: External ear normal.  Left Ear: External ear normal.  Nose: Nose normal.  Mouth/Throat: Oropharynx is clear and moist.  Eyes: Conjunctivae and EOM are normal. Pupils are equal, round, and reactive to light. Right eye exhibits no discharge. Left eye exhibits no discharge. No scleral icterus.  Neck: Normal range of motion. Neck supple. No tracheal deviation present. No thyromegaly present.  Cardiovascular: Normal rate, regular rhythm, normal heart sounds and intact distal pulses.  Exam reveals no gallop and no friction rub.   No murmur heard. Respiratory: Effort normal and breath sounds normal. No respiratory distress. She has no wheezes. She has no rales. She exhibits no tenderness.  GI: Soft. Bowel sounds are normal. She exhibits no distension and no mass. There is no tenderness. There is no rebound and no guarding.  Genitourinary:  Breasts no masses skin changes or nipple changes bilaterally      Vulva is normal without lesions Vagina is pink moist without  discharge Cervix absent Uterus is absent Adnexa is negative   Musculoskeletal: Normal range of motion. She exhibits no edema and no tenderness.  Neurological: She is alert and oriented to person, place, and time. She has normal reflexes. She displays normal reflexes. No cranial nerve deficit. She exhibits normal muscle tone. Coordination normal.  Skin: Skin is warm and dry. No rash noted. No erythema. No pallor.  Psychiatric: She has a normal mood and affect. Her behavior is normal. Judgment and thought content normal.       Medications Ordered at today's visit: Meds ordered this encounter  Medications   Fezolinetant (VEOZAH) 45 MG TABS    Sig: Take 1 tablet (45 mg total) by mouth at bedtime.    Dispense:  30 tablet    Refill:  11   terconazole (TERAZOL 7) 0.4 % vaginal cream    Sig: Place 1 applicator vaginally at bedtime.    Dispense:  45 g    Refill:  0    Other orders placed at today's visit: No orders of the defined types were placed in this encounter.     Assessment:    Normal Gyn exam.    ICD-10-CM   1. Well woman exam with routine gynecological exam  Z01.419     2. Vasomotor symptoms due to menopause(surgical)  N95.1    Rx: veozah 45 at bedtime    3. History of endometriosis(S/P TAH BSO 12/2020)  Z87.42     4. Yeast vaginitis on amoxicillin  B37.31    Rx: teraxol 7        Plan:    Contraception: status post hysterectomy. Follow up in: 3 years.     Return in about 3 months (around 06/24/2023) for MyChart Connect visit: veozah response.

## 2023-03-26 ENCOUNTER — Other Ambulatory Visit: Payer: Self-pay | Admitting: *Deleted

## 2023-03-26 MED ORDER — VEOZAH 45 MG PO TABS: 1.0000 | ORAL_TABLET | Freq: Every day | ORAL | 11 refills | Status: DC

## 2023-03-27 ENCOUNTER — Telehealth: Payer: Self-pay

## 2023-03-27 NOTE — Telephone Encounter (Signed)
ERROR

## 2023-04-03 ENCOUNTER — Encounter: Payer: Self-pay | Admitting: Neurology

## 2023-04-03 ENCOUNTER — Institutional Professional Consult (permissible substitution): Payer: 59 | Admitting: Neurology

## 2023-04-22 ENCOUNTER — Other Ambulatory Visit: Payer: Self-pay | Admitting: Obstetrics & Gynecology

## 2023-04-30 ENCOUNTER — Institutional Professional Consult (permissible substitution): Payer: 59 | Admitting: Neurology

## 2023-04-30 ENCOUNTER — Encounter: Payer: Self-pay | Admitting: Neurology

## 2023-04-30 ENCOUNTER — Encounter: Payer: Self-pay | Admitting: *Deleted

## 2023-04-30 ENCOUNTER — Telehealth: Payer: Self-pay | Admitting: *Deleted

## 2023-04-30 NOTE — Telephone Encounter (Signed)
Pt no showed appt today.  (#2)

## 2023-05-06 ENCOUNTER — Telehealth: Payer: Self-pay

## 2023-05-06 NOTE — Telephone Encounter (Signed)
Patient has been a No Show 2x in our office for initial consult. Do Not Schedule pt.

## 2023-05-29 ENCOUNTER — Institutional Professional Consult (permissible substitution): Payer: 59 | Admitting: Neurology

## 2023-05-29 ENCOUNTER — Telehealth: Payer: Self-pay | Admitting: *Deleted

## 2023-05-29 NOTE — Telephone Encounter (Signed)
Pt did not make her appointment today.  This is her 3rd no show.

## 2023-05-30 ENCOUNTER — Encounter: Payer: Self-pay | Admitting: Neurology

## 2023-06-24 ENCOUNTER — Telehealth: Payer: 59 | Admitting: Obstetrics & Gynecology

## 2023-10-14 DIAGNOSIS — N926 Irregular menstruation, unspecified: Secondary | ICD-10-CM | POA: Diagnosis not present

## 2023-10-14 DIAGNOSIS — R1084 Generalized abdominal pain: Secondary | ICD-10-CM | POA: Diagnosis not present

## 2024-01-24 ENCOUNTER — Other Ambulatory Visit: Payer: Self-pay | Admitting: *Deleted

## 2024-01-25 MED ORDER — VEOZAH 45 MG PO TABS: 1.0000 | ORAL_TABLET | Freq: Every day | ORAL | 11 refills | Status: DC

## 2024-01-31 ENCOUNTER — Other Ambulatory Visit: Payer: Self-pay | Admitting: *Deleted

## 2024-01-31 MED ORDER — VEOZAH 45 MG PO TABS: 1.0000 | ORAL_TABLET | Freq: Every day | ORAL | 11 refills | Status: AC

## 2024-10-27 ENCOUNTER — Ambulatory Visit: Admitting: Obstetrics & Gynecology
